# Patient Record
Sex: Female | Born: 1945 | Race: White | Hispanic: No | Smoking: Never smoker
Health system: Southern US, Community
[De-identification: ages and names within clinical notes are randomized; demographics above are authoritative.]

## PROBLEM LIST (undated history)

## (undated) DIAGNOSIS — K219 Gastro-esophageal reflux disease without esophagitis: Secondary | ICD-10-CM

## (undated) DIAGNOSIS — F32A Depression, unspecified: Secondary | ICD-10-CM

## (undated) DIAGNOSIS — I2510 Atherosclerotic heart disease of native coronary artery without angina pectoris: Secondary | ICD-10-CM

## (undated) DIAGNOSIS — I10 Essential (primary) hypertension: Secondary | ICD-10-CM

## (undated) DIAGNOSIS — E119 Type 2 diabetes mellitus without complications: Secondary | ICD-10-CM

## (undated) DIAGNOSIS — F329 Major depressive disorder, single episode, unspecified: Secondary | ICD-10-CM

## (undated) HISTORY — PX: PERCUTANEOUS CORONARY ROTOBLATOR INTERVENTION (PCI-R): SHX6015

---

## 2005-07-06 ENCOUNTER — Ambulatory Visit: Payer: Self-pay

## 2013-04-04 ENCOUNTER — Emergency Department: Payer: Self-pay | Admitting: Emergency Medicine

## 2013-04-04 LAB — DRUG SCREEN, URINE
Amphetamines, Ur Screen: NEGATIVE (ref ?–1000)
Cocaine Metabolite,Ur ~~LOC~~: NEGATIVE (ref ?–300)
MDMA (Ecstasy)Ur Screen: POSITIVE (ref ?–500)
Methadone, Ur Screen: NEGATIVE (ref ?–300)
Opiate, Ur Screen: NEGATIVE (ref ?–300)
Phencyclidine (PCP) Ur S: NEGATIVE (ref ?–25)
Tricyclic, Ur Screen: NEGATIVE (ref ?–1000)

## 2013-04-04 LAB — URINALYSIS, COMPLETE
Bilirubin,UR: NEGATIVE
Blood: NEGATIVE
Glucose,UR: >=500 mg/dL (ref 0–75)
Hyaline Cast: 4
Ketone: NEGATIVE
Nitrite: POSITIVE
Ph: 5 (ref 4.5–8.0)
Specific Gravity: 1.02 (ref 1.003–1.030)
Squamous Epithelial: 1

## 2013-04-04 LAB — CBC
HCT: 42.1 % (ref 35.0–47.0)
HGB: 14.2 g/dL (ref 12.0–16.0)
MCHC: 33.6 g/dL (ref 32.0–36.0)
RBC: 4.69 10*6/uL (ref 3.80–5.20)
RDW: 13 % (ref 11.5–14.5)
WBC: 7.7 10*3/uL (ref 3.6–11.0)

## 2013-04-04 LAB — COMPREHENSIVE METABOLIC PANEL
Alkaline Phosphatase: 126 U/L (ref 50–136)
Anion Gap: 7 (ref 7–16)
Bilirubin,Total: 0.8 mg/dL (ref 0.2–1.0)
Calcium, Total: 8.8 mg/dL (ref 8.5–10.1)
Chloride: 101 mmol/L (ref 98–107)
Co2: 23 mmol/L (ref 21–32)
Creatinine: 1.72 mg/dL — ABNORMAL HIGH (ref 0.60–1.30)
EGFR (Non-African Amer.): 31 — ABNORMAL LOW
Osmolality: 300 (ref 275–301)
SGPT (ALT): 19 U/L (ref 12–78)
Sodium: 131 mmol/L — ABNORMAL LOW (ref 136–145)

## 2013-04-04 LAB — BASIC METABOLIC PANEL
Anion Gap: 7 (ref 7–16)
BUN: 26 mg/dL — ABNORMAL HIGH (ref 7–18)
Co2: 22 mmol/L (ref 21–32)
EGFR (African American): 46 — ABNORMAL LOW
EGFR (Non-African Amer.): 40 — ABNORMAL LOW
Glucose: 399 mg/dL — ABNORMAL HIGH (ref 65–99)
Osmolality: 295 (ref 275–301)
Sodium: 137 mmol/L (ref 136–145)

## 2013-04-04 LAB — ETHANOL: Ethanol: <3 mg/dL

## 2013-05-14 ENCOUNTER — Inpatient Hospital Stay: Payer: Self-pay | Admitting: Student

## 2013-05-14 LAB — DRUG SCREEN, URINE
Barbiturates, Ur Screen: NEGATIVE (ref ?–200)
Cannabinoid 50 Ng, Ur ~~LOC~~: NEGATIVE (ref ?–50)
Cocaine Metabolite,Ur ~~LOC~~: NEGATIVE (ref ?–300)
Opiate, Ur Screen: POSITIVE (ref ?–300)

## 2013-05-14 LAB — URINALYSIS, COMPLETE
Glucose,UR: >=500 mg/dL (ref 0–75)
Nitrite: NEGATIVE
RBC,UR: 4 /HPF (ref 0–5)
Specific Gravity: 1.018 (ref 1.003–1.030)
Squamous Epithelial: 1
WBC UR: 85 /HPF (ref 0–5)

## 2013-05-14 LAB — PROTIME-INR: INR: 1.1

## 2013-05-14 LAB — COMPREHENSIVE METABOLIC PANEL
Albumin: 2.9 g/dL — ABNORMAL LOW (ref 3.4–5.0)
Alkaline Phosphatase: 131 U/L (ref 50–136)
BUN: 38 mg/dL — ABNORMAL HIGH (ref 7–18)
Calcium, Total: 9.4 mg/dL (ref 8.5–10.1)
Co2: 18 mmol/L — ABNORMAL LOW (ref 21–32)
EGFR (African American): 40 — ABNORMAL LOW
Osmolality: 285 (ref 275–301)
Potassium: 4.4 mmol/L (ref 3.5–5.1)
Total Protein: 7.4 g/dL (ref 6.4–8.2)

## 2013-05-14 LAB — TROPONIN I: Troponin-I: 3.2 ng/mL — ABNORMAL HIGH

## 2013-05-14 LAB — CBC
HCT: 40 % (ref 35.0–47.0)
HGB: 13.3 g/dL (ref 12.0–16.0)
MCHC: 33.3 g/dL (ref 32.0–36.0)
RDW: 13.2 % (ref 11.5–14.5)
WBC: 12.4 10*3/uL — ABNORMAL HIGH (ref 3.6–11.0)

## 2013-05-14 LAB — CK TOTAL AND CKMB (NOT AT ARMC)
CK, Total: 186 U/L (ref 21–215)
CK-MB: 3.9 ng/mL — ABNORMAL HIGH (ref 0.5–3.6)

## 2013-05-15 LAB — APTT
Activated PTT: 43.6 secs — ABNORMAL HIGH (ref 23.6–35.9)
Activated PTT: 43.8 secs — ABNORMAL HIGH (ref 23.6–35.9)
Activated PTT: 48 secs — ABNORMAL HIGH (ref 23.6–35.9)
Activated PTT: 48.9 secs — ABNORMAL HIGH (ref 23.6–35.9)

## 2013-05-15 LAB — COMPREHENSIVE METABOLIC PANEL
Albumin: 2.3 g/dL — ABNORMAL LOW (ref 3.4–5.0)
Alkaline Phosphatase: 105 U/L (ref 50–136)
Anion Gap: 10 (ref 7–16)
BUN: 29 mg/dL — ABNORMAL HIGH (ref 7–18)
Bilirubin,Total: 0.7 mg/dL (ref 0.2–1.0)
Calcium, Total: 8.7 mg/dL (ref 8.5–10.1)
Co2: 23 mmol/L (ref 21–32)
Creatinine: 1.48 mg/dL — ABNORMAL HIGH (ref 0.60–1.30)
EGFR (African American): 43 — ABNORMAL LOW
Glucose: 168 mg/dL — ABNORMAL HIGH (ref 65–99)
Osmolality: 280 (ref 275–301)
SGPT (ALT): 16 U/L (ref 12–78)
Sodium: 135 mmol/L — ABNORMAL LOW (ref 136–145)
Total Protein: 6.3 g/dL — ABNORMAL LOW (ref 6.4–8.2)

## 2013-05-15 LAB — CK TOTAL AND CKMB (NOT AT ARMC): CK, Total: 147 U/L (ref 21–215)

## 2013-05-15 LAB — LIPID PANEL
Cholesterol: 102 mg/dL (ref 0–200)
Ldl Cholesterol, Calc: 39 mg/dL (ref 0–100)
Triglycerides: 262 mg/dL — ABNORMAL HIGH (ref 0–200)

## 2013-05-15 LAB — CBC WITH DIFFERENTIAL/PLATELET
Basophil %: 0.3 %
Eosinophil #: 0 10*3/uL (ref 0.0–0.7)
Eosinophil %: 0 %
HGB: 12.5 g/dL (ref 12.0–16.0)
Lymphocyte #: 0.7 10*3/uL — ABNORMAL LOW (ref 1.0–3.6)
MCH: 30.1 pg (ref 26.0–34.0)
MCHC: 34.7 g/dL (ref 32.0–36.0)
MCV: 87 fL (ref 80–100)
Neutrophil %: 83.7 %
WBC: 10.3 10*3/uL (ref 3.6–11.0)

## 2013-05-15 LAB — TSH: Thyroid Stimulating Horm: 1.66 u[IU]/mL

## 2013-05-15 LAB — HEMOGLOBIN A1C: Hemoglobin A1C: 12.2 % — ABNORMAL HIGH (ref 4.2–6.3)

## 2013-05-16 LAB — APTT: Activated PTT: 47.4 secs — ABNORMAL HIGH (ref 23.6–35.9)

## 2013-05-16 LAB — BASIC METABOLIC PANEL
BUN: 33 mg/dL — ABNORMAL HIGH (ref 7–18)
Chloride: 99 mmol/L (ref 98–107)
Co2: 27 mmol/L (ref 21–32)
EGFR (African American): 32 — ABNORMAL LOW

## 2013-05-16 LAB — CULTURE, BLOOD (SINGLE)

## 2013-05-16 LAB — URINE CULTURE

## 2013-05-17 LAB — BASIC METABOLIC PANEL
Anion Gap: 9 (ref 7–16)
BUN: 27 mg/dL — ABNORMAL HIGH (ref 7–18)
Chloride: 104 mmol/L (ref 98–107)
Co2: 25 mmol/L (ref 21–32)
Creatinine: 1.42 mg/dL — ABNORMAL HIGH (ref 0.60–1.30)
EGFR (Non-African Amer.): 39 — ABNORMAL LOW
Osmolality: 291 (ref 275–301)
Sodium: 138 mmol/L (ref 136–145)

## 2013-05-17 LAB — CBC WITH DIFFERENTIAL/PLATELET
Basophil #: 0 10*3/uL (ref 0.0–0.1)
Eosinophil #: 0 10*3/uL (ref 0.0–0.7)
HGB: 12 g/dL (ref 12.0–16.0)
Lymphocyte #: 1.1 10*3/uL (ref 1.0–3.6)
MCH: 29.7 pg (ref 26.0–34.0)
MCHC: 33.8 g/dL (ref 32.0–36.0)
MCV: 88 fL (ref 80–100)
Monocyte #: 0.6 x10 3/mm (ref 0.2–0.9)
Monocyte %: 9.4 %
Neutrophil #: 4.4 10*3/uL (ref 1.4–6.5)
Neutrophil %: 72 %
Platelet: 101 10*3/uL — ABNORMAL LOW (ref 150–440)
RBC: 4.03 10*6/uL (ref 3.80–5.20)
WBC: 6.1 10*3/uL (ref 3.6–11.0)

## 2013-05-30 ENCOUNTER — Inpatient Hospital Stay: Payer: Self-pay | Admitting: Internal Medicine

## 2013-05-30 LAB — CBC
HCT: 40.6 % (ref 35.0–47.0)
HGB: 13.6 g/dL (ref 12.0–16.0)
MCH: 29.5 pg (ref 26.0–34.0)
MCHC: 33.6 g/dL (ref 32.0–36.0)
MCV: 88 fL (ref 80–100)
Platelet: 180 10*3/uL (ref 150–440)
RBC: 4.62 10*6/uL (ref 3.80–5.20)
RDW: 13.4 % (ref 11.5–14.5)
WBC: 8.6 10*3/uL (ref 3.6–11.0)

## 2013-05-30 LAB — URINALYSIS, COMPLETE
Glucose,UR: 50 mg/dL (ref 0–75)
Ketone: NEGATIVE
Nitrite: NEGATIVE
Squamous Epithelial: 26
WBC UR: 170 /HPF (ref 0–5)

## 2013-05-30 LAB — COMPREHENSIVE METABOLIC PANEL
Albumin: 3.4 g/dL (ref 3.4–5.0)
Bilirubin,Total: 0.5 mg/dL (ref 0.2–1.0)
Creatinine: 3.5 mg/dL — ABNORMAL HIGH (ref 0.60–1.30)
EGFR (Non-African Amer.): 13 — ABNORMAL LOW
Osmolality: 276 (ref 275–301)
SGPT (ALT): 15 U/L (ref 12–78)
Total Protein: 7.7 g/dL (ref 6.4–8.2)

## 2013-05-31 LAB — CBC WITH DIFFERENTIAL/PLATELET
Basophil #: 0 10*3/uL (ref 0.0–0.1)
Basophil %: 0.6 %
Eosinophil #: 0.1 10*3/uL (ref 0.0–0.7)
HCT: 33.4 % — ABNORMAL LOW (ref 35.0–47.0)
Lymphocyte #: 2.2 10*3/uL (ref 1.0–3.6)
MCHC: 34.3 g/dL (ref 32.0–36.0)
MCV: 88 fL (ref 80–100)
Monocyte #: 0.4 x10 3/mm (ref 0.2–0.9)
Monocyte %: 8.7 %
Neutrophil %: 41.8 %
Platelet: 164 10*3/uL (ref 150–440)
RBC: 3.81 10*6/uL (ref 3.80–5.20)
RDW: 13.2 % (ref 11.5–14.5)
WBC: 4.6 10*3/uL (ref 3.6–11.0)

## 2013-05-31 LAB — BASIC METABOLIC PANEL
BUN: 40 mg/dL — ABNORMAL HIGH (ref 7–18)
Chloride: 101 mmol/L (ref 98–107)
Creatinine: 3.1 mg/dL — ABNORMAL HIGH (ref 0.60–1.30)
EGFR (Non-African Amer.): 15 — ABNORMAL LOW
Glucose: 190 mg/dL — ABNORMAL HIGH (ref 65–99)
Potassium: 4.1 mmol/L (ref 3.5–5.1)
Sodium: 134 mmol/L — ABNORMAL LOW (ref 136–145)

## 2013-05-31 LAB — HEMOGLOBIN A1C: Hemoglobin A1C: 12.5 % — ABNORMAL HIGH (ref 4.2–6.3)

## 2013-06-01 LAB — BASIC METABOLIC PANEL
BUN: 32 mg/dL — ABNORMAL HIGH (ref 7–18)
Calcium, Total: 8.7 mg/dL (ref 8.5–10.1)
Co2: 25 mmol/L (ref 21–32)
EGFR (African American): 31 — ABNORMAL LOW
EGFR (Non-African Amer.): 26 — ABNORMAL LOW
Glucose: 187 mg/dL — ABNORMAL HIGH (ref 65–99)
Potassium: 4.9 mmol/L (ref 3.5–5.1)

## 2013-06-01 LAB — URINE CULTURE

## 2013-06-02 LAB — BASIC METABOLIC PANEL
Anion Gap: 5 — ABNORMAL LOW (ref 7–16)
Chloride: 111 mmol/L — ABNORMAL HIGH (ref 98–107)
Co2: 26 mmol/L (ref 21–32)
Creatinine: 1.53 mg/dL — ABNORMAL HIGH (ref 0.60–1.30)
EGFR (African American): 41 — ABNORMAL LOW
EGFR (Non-African Amer.): 35 — ABNORMAL LOW
Glucose: 92 mg/dL (ref 65–99)
Osmolality: 288 (ref 275–301)
Potassium: 4.6 mmol/L (ref 3.5–5.1)
Sodium: 142 mmol/L (ref 136–145)

## 2013-09-02 ENCOUNTER — Emergency Department: Payer: Self-pay | Admitting: Emergency Medicine

## 2014-11-02 ENCOUNTER — Emergency Department: Payer: Self-pay | Admitting: Emergency Medicine

## 2014-11-02 LAB — COMPREHENSIVE METABOLIC PANEL
ANION GAP: 15 (ref 7–16)
Albumin: 3.8 g/dL (ref 3.4–5.0)
Alkaline Phosphatase: 126 U/L — ABNORMAL HIGH
BUN: 24 mg/dL — ABNORMAL HIGH (ref 7–18)
Bilirubin,Total: 0.9 mg/dL (ref 0.2–1.0)
CALCIUM: 9.7 mg/dL (ref 8.5–10.1)
CHLORIDE: 97 mmol/L — AB (ref 98–107)
Co2: 22 mmol/L (ref 21–32)
Creatinine: 1.61 mg/dL — ABNORMAL HIGH (ref 0.60–1.30)
GFR CALC AF AMER: 41 — AB
GFR CALC NON AF AMER: 34 — AB
Glucose: 564 mg/dL (ref 65–99)
OSMOLALITY: 298 (ref 275–301)
POTASSIUM: 4.2 mmol/L (ref 3.5–5.1)
SGOT(AST): 20 U/L (ref 15–37)
SGPT (ALT): 20 U/L
SODIUM: 134 mmol/L — AB (ref 136–145)
Total Protein: 7.5 g/dL (ref 6.4–8.2)

## 2014-11-02 LAB — CBC WITH DIFFERENTIAL/PLATELET
BASOS PCT: 0.7 %
Basophil #: 0.1 10*3/uL (ref 0.0–0.1)
Eosinophil #: 0 10*3/uL (ref 0.0–0.7)
Eosinophil %: 0.5 %
HCT: 44.7 % (ref 35.0–47.0)
HGB: 14.4 g/dL (ref 12.0–16.0)
LYMPHS PCT: 14.8 %
Lymphocyte #: 1.3 10*3/uL (ref 1.0–3.6)
MCH: 29.8 pg (ref 26.0–34.0)
MCHC: 32.3 g/dL (ref 32.0–36.0)
MCV: 92 fL (ref 80–100)
Monocyte #: 0.5 x10 3/mm (ref 0.2–0.9)
Monocyte %: 6 %
NEUTROS PCT: 78 %
Neutrophil #: 6.9 10*3/uL — ABNORMAL HIGH (ref 1.4–6.5)
Platelet: 132 10*3/uL — ABNORMAL LOW (ref 150–440)
RBC: 4.85 10*6/uL (ref 3.80–5.20)
RDW: 13.6 % (ref 11.5–14.5)
WBC: 8.9 10*3/uL (ref 3.6–11.0)

## 2014-11-02 LAB — LIPASE, BLOOD: Lipase: 156 U/L (ref 73–393)

## 2014-11-02 LAB — TROPONIN I: Troponin-I: <0.02 ng/mL

## 2015-02-19 NOTE — H&P (Signed)
PATIENT NAME:  [name] NeatCATES, [name] Munoz MR#:  [id number] DATE OF BIRTH:  July 16, 1946  DATE OF ADMISSION:  05/30/2013  PRIMARY CARE PHYSICIAN:  Dr. [facility] at [facility]  CHIEF COMPLAINT:  Does not remember anything from being at the bank, and found to have a low sugar.   HISTORY OF PRESENT ILLNESS: This is a 69 year old female who was [facility], and they served out a snack around 2:00 p.m. She did not eat it. She wanted to keep it until she got home, and had something to drink. She went to the back around 5:00, and that is the last thing she remembered until the ambulance came. Her sugar was low. She was on Lantus 65 units in the morning and 64 units in the evening up until today, when they decreased it to 50 twice a day because she has had some low sugars. In the ER, her first sugar was 85 and the last sugar was 114. She is eating, but does have a decreased appetite. She has been taking Coricidin HBP for a cough. She recently finished up a course of antibiotics from recently being in the hospital for E. coli sepsis. In the ER, she was found to have acute renal failure, with a creatinine up at 3.5, was treated for hypoglycemia, and her urinalysis was also positive. Hospitalist services were contacted for further evaluation.    PAST MEDICAL HISTORY: Hypertension, gastroesophageal reflux disease, depression, chronic insomnia, IBS, diabetes, diabetic neuropathy, diabetic retinopathy, coronary artery disease, chronic kidney disease, hyperlipidemia, arthritis, chronic back pain, recent admission for E. coli sepsis, and encephalopathy.   PAST SURGICAL HISTORY:  Cataracts.   ALLERGIES:  PENICILLIN, ERYTHROMYCIN.   MEDICATIONS:  Include Wellbutrin 150 mg twice a day, hydrochlorothiazide/lisinopril 25/20, 1 tablet daily, Protonix 40 mg daily, pravastatin 40 mg daily, tramadol 50 mg twice a day as needed for back pain, Plavix 75 mg daily, metoprolol 25 mg daily, trazodone 25 mg at bedtime as needed for  sleep, aspirin 81 mg daily, Celexa 40 mg daily, Lantus just decreased today to 50 units subcutaneous injection twice a day, NovoLog Flex pen 15 units 3 times a day before meals, hydrocortisone topically 1% to rash p.r.n., vitamin D2, 50,000 units once a week, Eucerin cream p.r.n., Norco 5/325 every 6 hours as needed for pain.   SOCIAL HISTORY:  No smoking. No alcohol. No drug use. Lives alone. Used to work in a [employer].   FAMILY HISTORY:  Father died at 2771 of an MI. Mother died of gallbladder cancer, had diabetes and depression.   REVIEW OF SYSTEMS:  CONSTITUTIONAL: Positive for low-grade fever. Positive for chills. No sweats. No weight loss. No weight gain. No weakness or fatigue.  EYES:  She does wear glasses.  EARS, NOSE, MOUTH AND THROAT:  Decreased hearing right ear. Positive for runny nose. Positive for sore throat.  CARDIOVASCULAR:  No chest pain. No palpitations.  RESPIRATORY:  Positive for shortness of breath, cough, green-yellow phlegm since Monday.  GASTROINTESTINAL: Positive for abdominal pain, occasionally sharp in nature. Occasional diarrhea. No bright red blood per rectum. No nausea/vomiting. Positive for burning on urination. No hematuria.  MUSCULOSKELETAL: Positive for low back pain.  PSYCHIATRIC:  Positive for anxiety.  ENDOCRINE:  No thyroid problems.  HEMATOLOGIC/LYMPHATIC: No anemia.   PHYSICAL EXAMINATION: VITAL SIGNS: Temperature 97.6, pulse 58, respirations 18, blood pressure 116/58, pulse ox 98% on room air.  GENERAL:  No respiratory distress.  EYES: Conjunctivae and lids normal. Pupils equal, round and reactive  to light. Extraocular muscles intact. No nystagmus.  EARS, NOSE, MOUTH AND THROAT: Tympanic membranes:  No erythema. Nasal mucosa: No erythema. Throat:  No erythema, no exudate seen. Lips and gums:  No lesions.  NECK: No JVD. No bruits. No lymphadenopathy. No thyromegaly. No thyroid nodules palpated.  RESPIRATORY:  Lungs clear to auscultation. No use of  accessory muscles to breathe. No rhonchi, rales or wheeze heard.  CARDIOVASCULAR SYSTEM:  S1, S2 normal. No gallops, rubs or murmurs heard. Carotid upstroke 2+ bilaterally. No bruits. Dorsalis pedis pulses 2+ bilaterally. No edema of the lower extremities.  ABDOMEN:  Soft, nontender. No organomegaly/splenomegaly. Normoactive bowel sounds. No masses felt.  LYMPHATIC:  No lymph nodes in the neck.  MUSCULOSKELETAL: No clubbing, edema or cyanosis.  SKIN:  No rashes or ulcers seen.  NEUROLOGIC:  Cranial nerves II through XII grossly intact. Deep tendon reflexes 2+ bilateral lower extremities.  PSYCHIATRIC: The patient is oriented to person, place and time.   LABORATORY AND RADIOLOGICAL DATA: Urinalysis: 3+ leukocyte esterase, 3+ bacteria. Last sugar was 114. White blood cell count 8.6, H and H 13.6 and 40.6, platelet count 180. Glucose 90, BUN 42, creatinine 3.5, sodium 133, potassium 4.2, chloride 101, CO2 of 23, calcium 9.2. Liver function tests:  Normal range. GFR 13. Troponin negative.   ASSESSMENT AND PLAN: 1.  Acute renal failure on chronic kidney disease. Creatinine today 3.5, baseline around 1.4. Will give IV fluid hydration. Hold lisinopril/hydrochlorothiazide. Will get an ultrasound of the kidneys and bladder.   2.  Hypoglycemia with diabetes, likely worsened, kidney function is contributing. Will hold the Lantus and short-acting insulin at this time, just check fingersticks q. 2 hours. Will try to hold off on adding D5 in the IV fluids at this point. If drops, will have to add on D5. If the sugars start rising, may have to start back a lower dose Lantus in the morning or tomorrow evening.   3.  Urinary tract infection, with recent E. coli sepsis. The E. coli was pansensitive. I will put on Rocephin like she had last time until cultures are back.   4.  Hypertension. Will hold lisinopril/hydrochlorothiazide. Blood pressure is on the lower side. Will give IV fluid hydration and continue to  monitor the blood pressure.   5.  Gastroesophageal reflux disease, on Protonix.   6.  Depression. Continue Wellbutrin.   7.  Hyperlipidemia. Continue pravastatin.   8.  History of coronary artery disease. On Plavix and aspirin.   9.  Chronic back pain. Stop tramadol because it interacts with the Wellbutrin, and continue the Norco.   Time spent on admission:  50 minutes.    ____________________________ Herschell Dimes. Renae Gloss, MD rjw:mr D: 05/30/2013 21:36:14 ET T: 05/30/2013 22:10:43 ET JOB#: [id number]  cc: Herschell Dimes. Renae Gloss, MD, <Dictator> Tammy H. Shon Baton, NP   Salley Scarlet MD ELECTRONICALLY SIGNED 06/13/2013 16:32

## 2015-02-19 NOTE — H&P (Signed)
PATIENT NAME:  [name] NeatCATES, Amazing MR#:  [id number] DATE OF BIRTH:  16-Aug-1946  DATE OF ADMISSION:  05/14/2013  Since the patient has decreased CO2, decreased PO2, with mixed picture of metabolic acidosis and respiratory alkalosis compensated, decided to do a D-dimer. The D-dimer is elevated at 2.72. V/Q scan ordered, as the patient has chronic kidney disease, with a creatinine above 1.5, to rule out pulmonary embolism. At this moment, the patient is actually getting anticoagulation for non-ST elevation MI, likely also a pulmonary embolism. The non-ST elevation MI could be secondary to the pulmonary embolism. We are going to rule it out.   At this moment, the patient has a lot of problems. There is life-threatening potential for decompensation. I am concerned about her care, for what we are going to monitor very closely.   Critical care time due to multiple medical conditions, with potential decompensation critical care time is about 70 minutes.    ____________________________ Felipa Furnaceoberto Sanchez Gutierrez, MD rsg:mr D: 05/14/2013 18:53:23 ET T: 05/14/2013 19:27:41 ET JOB#: [id number]  cc: Felipa Furnaceoberto Sanchez Gutierrez, MD, <Dictator> Tedi Hughson Juanda ChanceSANCHEZ GUTIERRE MD ELECTRONICALLY SIGNED 05/15/2013 20:29

## 2015-02-19 NOTE — Discharge Summary (Signed)
PATIENT NAME:  [name] Munoz, [name] Munoz MR#:  [id number] DATE OF BIRTH:  11/27/1945  DATE OF ADMISSION:  05/14/2013 DATE OF DISCHARGE:  05/20/2013    PRIMARY CARE PHYSICIAN:  At [facility].   CHIEF COMPLAINT: Malaise status post fall, altered mental status.   DISCHARGE DIAGNOSES:  1.  Altered mental status secondary metabolic encephalopathy due to sepsis.  2.  Sepsis on arrival, currently resolved.  3.  Escherichia coli septicemia and a likely urinary tract infection.  4.  Positive troponin, likely  demand ischemia.  5.  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  6.  Diabetes.  7.  Renal failure, acute on chronic.  8.  Hypernatremia due to dehydration.  9.  Depression.  10.  History of gastroesophageal reflux disease.  11.  Insomnia.  12.  Irritable bowel syndrome.  13.  Diabetic neuropathy and retinopathy.  14.  History of falls.  15.  History of rhinitis.  16.  History of coronary artery disease.  17.  History of diastolic congestive heart failure.  18.  Chronic kidney disease. Baseline creatinine of 1.5. 19.  Degenerative joint disease and chronic back pain.  20.  Hyperlipidemia.   DISCHARGE MEDICATIONS:  1.  Bupropion 150 mg 2 times a day.  2.  Hydrochlorothiazide/lisinopril 25/20 mg once a day.  3.  Protonix 40 mg once a day.  4.  Pravastatin 40 mg daily.  5.  Tramadol 50 mg 2 times a day as needed for back pain.  6.  Plavix 75 mg daily.  7.  Metoprolol tartrate 25 mg daily.  8.  Trazodone 50 mg.  Take 1/2  tab at bedtime as needed for sleep.  9.  Aspirin 81 mg daily.  10.  Celexa 40 mg daily.  11.  Lantus 65 units once a day in the morning and 64 units in the evening.  12.  NovoLog through FlexPen 15 units 3 times a day before meals.  13.  Hydrocortisone topical 1%, apply to rash 2 times a day as needed.  14.  Vitamin D3 50,000 international units once a week.  15.  Eucerin p.r.n. Apply to affected area. 16.  Norco 325/5 mg every 6 hours as needed for pain.  17.  Cephalexin 500 mg  every 8 hours for 3 days only.   Going home with resumption of the [facility] program.   DIET: Low sodium, low fat, low cholesterol, ADA diet.   ACTIVITY: As tolerated.   DISCHARGE INSTRUCTIONS: Please follow with PCP and cardiologist within 1 to 2 weeks.   SIGNIFICANT LABS AND IMAGING: Initial BUN 38, creatinine 1.56, sodium 126, initial glucose 498 with bicarb of 18. Hemoglobin A1c was noted to be 12.2. Triglycerides 262, HDL 11, LDL of 39. Initial troponin 3.2 and next one 1.9 and next one, 1.8. Initial CK-MB was 3.9 and CK total of 265. CK total went down to 147 and CK-MB was normalized as well.   Initial LFTs showed a total bilirubin of 1.4, albumin of 2.9; otherwise within normal limits.   Detox positive for MDMA and opiates.   Initial WBC 12.4 and it did normalize. Initial hemoglobin was 13.3. Platelets initially were 94. Last platelet of 101.   D-dimer was 2.7. INR 1.1 and PT was 14.3.   Blood cultures came back positive pansensitive E. coli.   UA was also positive for 3+ leukocyte esterase, 4 RBCs and 85 WBCs with 3+ bacteria, but the urine cultures were sent after antibiotics and they came back negative.   Initial ABG showed pH  of 7.39, pCO2 of 28 and pO2 of 119.   CT of the head without contrast on arrival showed no acute intracranial process. Chest x-ray on admission showed no evidence of atelectasis or pneumonia. V/Q scan showed normal ventilation perfusion.   HISTORY OF PRESENT ILLNESS AND HOSPITAL COURSE: For full details of H and P, please see the dictation on July 16 by Dr. Mordecai Maes, but briefly this is an obese 69 year old female with multiple medical issues including uncontrolled diabetes, hypertension, depression, IBS, neuropathy and chronic back pain, who went to see a podiatrist, diagnosed with a fracture of the left foot. She had apparently just leaned forward to pick up something from the chair and fell.   Over the weekend before admission, she could not get around much  and did not take most of her medications. The sugars were in the 400s. She also did have bouts of confusion and came into the ER. She did have a fever. She had a mild leukocytosis and was admitted for clinical sepsis and started on antibiotics. Blood cultures were sent and patient did have a positive UA.   She was started on Rocephin; however, her urine cultures were sent after the antibiotics and they came back no growth to date so far, but the blood cultures came back positive for E. coli. She had a CT of the head which was negative for acute intracranial pathology. Her altered mental status is resolved and that was likely secondary to sepsis and metabolic encephalopathy. The sepsis was likely secondary to a UTI and E. coli septicemia.   Her repeat blood cultures have been negative and she needs 3 more days of Keflex. Her ceftriaxone has been converted to it. She remains afebrile and the leukocytosis has reversed. She was noted to have a positive troponin on arrival and she was seen by cardiology. No cath is recommended at this time and she is to follow with outpatient with them.   Her aspirin, Plavix, beta blocker, statin, and ACE inhibitor were continued. She did have thrombocytopenia, but I suspect that was chronic as it was also lower in June before this started. She does have elevated D-dimer, however, had a V/Q scan with low risk. She does have a bout of acute-on-chronic renal failure secondary to above.   I suspect she has chronic renal failure, stage III. Her hyponatremia improved with fluids. At this point, she will be discharged with outpatient followup with cardiology and [facility] chest-pain free and afebrile.   PHYSICAL EXAMINATION:  VITAL SIGNS: Today her temperature was 97.1, Pulse rate was 61 and blood pressure is 101/61 with O2  saturation of 98% on room air.  GENERAL: Obese female lying in bed in no obvious distress.  NECK: Supple. LUNGS: Normal respiratory effort and no crackles  or wheezing on auscultation.  CARDIOVASCULAR: She does not have any significant murmurs and has regular rate and rhythm on examination of the heart.  ABDOMEN: She had no significant lower extremity edema or tenderness on abdominal palpation.   At this point, she will be discharged with outpatient followup as dictated above.   TOTAL TIME SPENT: Was 40 minutes.   CODE STATUS: FULL CODE.    ____________________________ Krystal Eaton, MD sa:np D: 05/20/2013 14:29:47 ET T: 05/20/2013 15:09:42 ET JOB#: [id number]  cc: Krystal Eaton, MD, <Dictator> Krystal Eaton MD ELECTRONICALLY SIGNED 06/01/2013 11:37

## 2015-02-19 NOTE — Discharge Summary (Signed)
PATIENT NAME:  [name] Munoz, Gerianne MR#:  [id number] DATE OF BIRTH:  1946/09/21  DATE OF ADMISSION:  05/30/2013 DATE OF DISCHARGE:  06/02/2013  ADMITTING DIAGNOSIS: Altered mental status.   DISCHARGE DIAGNOSES:  1. Altered mental status felt to be due to hypoglycemia as well as urinary tract infection as well as acute on chronic renal failure.  2. Hypoglycemia with diabetes, on high-dose insulin. The patient's insulin is decreased. Hypoglycemia likely due to renal failure as well as the patient's intake may have been less.  3. Urinary tract infection.  4. Hypertension.  5. Gastroesophageal reflux disease.  6. Depression.  7. Hyperlipidemia.  8. History of coronary artery disease.  9. Chronic back pain.  10. Seasonal allergies.   CONSULTANTS: Case Production designer, theatre/television/filmmanager.   PERTINENT LABORATORY EVALUATIONS: Admitting glucose 90, BUN 42, creatinine 3.50, sodium 133, potassium was 4.2, chloride 101, CO2 is 23. Hemoglobin A1c was 12.5. LFTs were normal. Troponin less than 0.02. WBC 8.6, hemoglobin 13.6, platelet count was 180. Urinalysis showed 3+ RBCs, WBCs 170, bacteria 3+. Urine culture showed mixed bacterial flora. Most recent creatinine is 1.53.   HOSPITAL COURSE: Please refer to H and P done by the admitting physician. The patient is a 69 year old female who [facility], where they gave her a snack around 2:00 p.m. She did not eat it. She wanted to keep it until she got home and had something to drink. She went to the back around 5:00, and then the last thing she remembered, until the ambulance came. Her sugar was low. The patient was on high-dose Lantus. She was seen in the ED and found to have acute renal failure as well as UTI as well as hypoglycemia. Therefore, we were asked to admit the patient. Initially, she was placed on D5 containing IV fluids. Her sugars were monitored closely as she has got a very labile blood sugar. Her blood glucose started increasing in the 200s. Therefore, she was  started back on her Lantus at lower dose. Her sugars continued to be labile, but under better control. No hypoglycemia. She may benefit from endocrinology outpatient consult. The patient was also noted to have acute renal failure, which was felt to be due to possible dehydration and concurrent lisinopril and HCTZ therapy. These were held. Given IV fluids. Her creatinine is close to baseline. The patient also was noted to have a significantly abnormal UA. She was treated with ceftriaxone, but her urine cultures came back as mixed bacterial flora. At this time, the patient is doing much better and feels much better and is stable for discharge.   DISCHARGE MEDICATIONS:  1. Bupropion 150 mg 1 tab p.o. b.i.d. 2. Protonix 40 daily.  3. Pravastatin 40 at bedtime. 4. Plavix 75 p.o. daily.  5. Metoprolol tartrate 25 one tab p.o. b.i.d. 6. Trazodone 25 at bedtime as needed for sleep.  7. Aspirin 81 mg 1 tab p.o. daily. 8. Celexa 40 daily.  9. Hydrocortisone topical 1% apply to rash 2 times a day as needed.  10. Vitamin D3 50,000 international units daily. 11. Eucerin apply topically to affected area daily as needed.  12. Norco 325/5 mg 1 tab p.o. q.6 p.r.n. for pain. 13. Tramadol 50 mg 1 tab p.o. b.i.d. as needed for pain. 14. Lantus 40 units subcutaneous at bedtime. 15. Humulin R sliding scale.  16. Ceftin 500 mg 1 tab p.o. b.i.d.   DIET: Low sodium, low fat, low cholesterol, carbohydrate controlled diet.   ACTIVITY: As tolerated.   FOLLOWUP:  With [facility] program in 1 to 2 weeks.   TIME SPENT: Note, 35 minutes spent on the discharge.    ____________________________ Lacie Scotts. Allena Katz, MD shp:OSi D: 06/03/2013 08:22:53 ET T: 06/03/2013 08:56:25 ET JOB#: [id number]  cc: Kamuela Magos H. Allena Katz, MD, <Dictator> Charise Carwin MD ELECTRONICALLY SIGNED 06/09/2013 10:25

## 2015-02-19 NOTE — Consult Note (Signed)
Brief Consult Note: Diagnosis: AMS/Demand ischemia/Sepsis.   Patient was seen by consultant.   Consult note dictated.   Recommend further assessment or treatment.   Orders entered.   Discussed with Attending MD.   Comments: IMP AMS Sepsis DM HTN Chronic pain Dehydration CAD CRI . PLAN ICU care Antibx IV Hydration DM control F/U troponin Switch heparin to SQ DVT prophylaxis Correct acidosis Consider nephology input I do not rec cath at this point.  Electronic Signatures: Dorothyann Pengallwood, Dwayne D (MD)  (Signed 17-Jul-14 17:04)  Authored: Brief Consult Note   Last Updated: 17-Jul-14 17:04 by Alwyn Peaallwood, Dwayne D (MD)

## 2015-02-19 NOTE — Consult Note (Signed)
PATIENT NAME:  [name] Munoz, [name] Munoz MR#:  [id number] DATE OF BIRTH:  1946-09-08  DATE OF CONSULTATION:  04/04/2013  CONSULTING PHYSICIAN:  Audery Amel, MD  IDENTIFYING INFORMATION AND REASON FOR CONSULT:  A 69 year old woman who presented to the Emergency Room by EMS. Stated that she had had some thoughts of killing herself. Consult for appropriate disposition after commitment papers had been filed.   HISTORY OF PRESENT ILLNESS: Information obtained from the patient's chart and also speaking with the nurse at [facility]. The patient reports that yesterday she had a conflict with her landlord who also happens to be her brother-in-law. She says that she had gone to get the money to pay her rent but was 5 minutes late getting back to his office and he refused to take it and told her that she would owe a late fee. The patient became very upset. She claims that the brother-in-law has been picking on her and harassing her to try to get her thrown out of the trailer park. The patient says she developed thoughts about wanting to take all of her pain medicine and kill herself. She called EMS and told them this and they eventually sent a police officer to bring her into the hospital. She did not actually take any overdose or try to harm herself. This is the same story she told [employer]. The patient is not reporting any psychotic symptoms. She is not reporting any substance abuse. She is currently getting her outpatient psychiatric treatment through [facility] and says that she has been compliant with her medication. She also reports chronic anxiety and stress in her life, but it has been about the same for quite a while.   PAST PSYCHIATRIC HISTORY: Long-standing history of mood sensitivity and depression symptoms. Has had several prior hospitalizations at [facility]. Has never actually tried to kill herself in the past. Has made suicidal statements several times. No history of homicidal  behavior. No known history of psychotic symptoms. The patient is currently being followed by [facility]. She cannot remember the names of her medicines, but says there are at least 2 of them that she is taking.   PAST MEDICAL HISTORY: The patient has diabetes and is on insulin. She knows how much she is supposed to take but says she did not take it last night and as a result presented with a very high blood sugar when she came in here. She also has a history of high blood pressure, acid reflux and fluid retention.   SOCIAL HISTORY: The patient is living by herself in a trailer park. She is estranged from her husband. She claims that the owner of the trailer park who is her brother-in-law harasses her and wants to get her thrown out of the trailer park. She currently gets her medical care from [facility] and mental health care at [facility].   SUBSTANCE ABUSE HISTORY:  Denies any history whatsoever of alcohol or drug abuse.   MEDICATIONS ON ADMISSION: Unknown, although she says she takes antidepressants, insulin, blood pressure medicine, a fluid pill, iron and aspirin.   ALLERGIES:  ERYTHROMYCIN, LAMISIL AND PENICILLIN.   REVIEW OF SYSTEMS: Recent depressed mood, which is improving. Some fatigue. No acute suicidal ideation. No homicidal ideation. No psychotic symptoms. No hallucinations.   MENTAL STATUS:  Reasonably well-groomed woman looks her stated age, lying down in the Emergency Room cooperative with the interview. Good eye contact, normal psychomotor activity. Speech normal in rate, tone and volume. Thoughts tend  to be perseverating on her complaints about the landlord. No obvious delusions or paranoia. Does not appear to be psychotic. Denies hallucinations. Denies suicidal or homicidal ideation currently. Affect is reactive. Mood stated as being better. The patient appears to probably be of lower average intelligence to average intelligence. Adequate judgment and insight at this time.    LABORATORY RESULTS: Blood sugar on presentation here was quite high with an initial presentation of 673. Alcohol undetected. TSH normal at 3.8. Drug screen positive for MDMA, probably representing medicine that she is taking. Does have an elevated creatinine of 1.39.   ASSESSMENT: A 69 year old woman with a history of chronic depression and mood instability, but not mania or psychosis. Currently, she is denying suicidal ideation. She has not shown any dangerous behavior in the Emergency Room. She is behaving in a calm and appropriate manner. At this point, she no longer meets commitment criteria.   PLAN:  For her to be discharged from the Emergency Room. She can return to stay at her own home. She will follow up with [facility] and with [facility]. The patient was given some education about managing mood instability and depression and staying compliant with treatment and she agrees to that.   DIAGNOSIS, PRINCIPAL AND PRIMARY:  AXIS I:  Depression, not otherwise specified.  SECONDARY DIAGNOSES: AXIS I:  No further.  AXIS II:  Borderline traits.   AXIS III:  Diabetes, high blood pressure.   AXIS IV:  Moderate to severe chronic from discord in her environment.   AXIS V:  Functioning at time of evaluation 56.      ____________________________  Audery AmelJohn T. Clapacs, MD jtc:ce D: 04/04/2013 17:03:21 ET T: 04/04/2013 18:07:13 ET JOB#: [id number]  cc: Audery AmelJohn T. Clapacs, MD, <Dictator> Audery AmelJOHN T CLAPACS MD ELECTRONICALLY SIGNED 04/05/2013 14:20

## 2015-02-19 NOTE — H&P (Signed)
PATIENT NAME:  [name] Munoz, [name] Munoz MR#:  [id number] DATE OF BIRTH:  05-04-1946  DATE OF ADMISSION:  05/14/2013  PRIMARY CARE PHYSICIAN: Alden Server B. Maryellen Pile, MD   CHIEF COMPLAINT: Malaise, status post fall. Was seen in the clinic and was told to come here to the ER due to alteration of mental status.   HISTORY OF PRESENT ILLNESS: This is a very nice 69 year old female who has history of multiple medical problems including uncontrolled diabetes, hypertension, depression, IBS, neuropathy, retinopathy, chronic kidney disease, coronary artery disease with diastolic dysfunction and chronic back pain. The patient comes today with a history of having a fall. This happened last Thursday, for which she went to see a podiatrist and she was diagnosed with a fracture on the left foot. Apparently, the patient just leaned forward to pick up something from the chair. The chair broke on the front leg and she landed on the floor. She has been recommended to use a walking boot, but she was mostly not moving much and staying in bed. Over the weekend she did not get around much, she did not take most of her medications, did not get well. Her blood sugars have been in the 400s and she feels very confused. The patient is separated from her husband. She lives by herself, so she has not been monitored closely. She went to the podiatrist. The podiatrist told her to go to [facility] for evaluation and since she was so confused with such high blood sugars, she was told to come to the ER. Apparently the patient has been having some fevers. Her last temperature was 104 at home, having chills and shivering a lot. She denies any cough, denies any sputum, but she feels really short of breath. She states that she has been urinating a lot more than usual.   The patient comes into the ER where she is evaluated. Her blood sugars are above 400. She is looking dehydrated. Her blood pressures are elevated. Her troponin is elevated as well. The patient  is positive for opiates in urine, which she takes on a regular basis. Her white count is slightly elevated. She has sepsis.   REVIEW OF SYSTEMS: A 12-system review of systems is done.  CONSTITUTIONAL: Positive fever. Positive chills. Positive shivering. Positive fatigue. Not moving a lot due to pain in her lower extremity. No significant weight loss or weight gain.  EYES: No blurry vision, double vision. Positive cataract surgeries.  EARS, NOSE, THROAT: No tinnitus. No difficulty swallowing. No epistaxis.  RESPIRATORY: Negative cough. Negative sputum. Negative wheezing. Negative hemoptysis. Positive mild shortness of breath. She denies any asthma or COPD. No painful respirations.  CARDIOVASCULAR: No chest pain. No orthopnea. No significant edema. No palpitations or syncope.  GASTROINTESTINAL: Positive nausea. Positive vomiting x 4 or 5 within the last 2 days. She has IBS and occasionally has diarrhea and constipation, but at this moment she says that she is normal. No melena or rectal bleeding.  GENITOURINARY: Increased frequency, mild dysuria. No hematuria. No incontinence.  GYNECOLOGIC: No breast masses or vaginal discharge.  ENDOCRINE: Positive polyuria. Positive nycturia. Positive sweating. Positive dry mouth. No thyroid dysfunction. No heat or cold intolerance.  HEMATOLOGIC AND LYMPHATIC: No anemia, easy bruising or swollen glands.  SKIN: No rashes, petechiae or new moles.  MUSCULOSKELETAL: Positive chronic back pain. No swollen joints. No gout.  NEUROLOGIC: Positive for confusion, changes in mental status. Positive diabetic neuropathy and also retinopathy. No dementia. No TIAs.  PSYCHIATRIC: Positive insomnia, but it is  well-controlled. No significant depression; it has been well-controlled. No significant anxiety lately.   PAST MEDICAL HISTORY: 1.  Hypertension.  2.  GERD.  3.  Depression.  4.  Insomnia, chronic.  5.  IBS.  6.  Diabetes.  7.  Diabetic neuropathy.  8.  Diabetic  retinopathy.  9.  History of falls.  10.  History of rhinitis.  11.  Coronary artery disease.  12.  Diastolic dysfunction, with last echo in 2012.  13.  Chronic kidney disease, baseline around 1.5.  14.  Hyperlipidemia.  15.  DJD.  16.  Back pain.   ALLERGIES: THE PATIENT IS ALLERGIC TO PENICILLIN, ERYTHROMYCIN AND LAMISIL.   PAST SURGICAL HISTORY: BTL and cataract surgery.   SOCIAL HISTORY: The patient is married, but she is separated with her husband and they have not lived together for the past year. She used to work in [employer]. She does not smoke. She does not drink.   FAMILY HISTORY: The patient denies any history of cancer in the family or heart disease. Positive hypertension.   CURRENT MEDICATIONS: Include Zofran 1 every 8 hours as needed for nausea, vitamin D 50,000 units every other week, trazodone 25 mg once a day at night, tramadol 50 mg twice daily, Protonix 40 mg once a day, pravastatin 40 mg once a day, Plavix 75 mg once a day, NovoLog FlexPen 50 units subcutaneously 3 times a day before meals, Norco 325/5 every 6 hours, MiraLAX 17 grams every other day, metoprolol 25 mg twice daily, Lantus 65 units subcutaneously every evening, Imodium as needed, hydrocortisone topical, hydrochlorothiazide with lisinopril 25 mg/20 mg once a day, Eucerin apply to affected area, Coricidin for cough, Celexa 40 mg once daily, aspirin 81 mg once daily.   PHYSICAL EXAMINATION: VITAL SIGNS: Blood pressure currently at 120/57, at admission 173/74, temperature 102, pulse 122, pulse oximetry low on room air, desaturated, 98% on 2 liters of oxygen.  GENERAL: The patient is alert, oriented x 3, in no acute distress. She looks debilitated. She looks dehydrated.  HEENT: Pupils are equal and reactive. Extraocular movements are intact. Mucosae are a little dry. Anicteric sclerae. Pink conjunctivae. No oral lesions. No oropharyngeal exudates. The patient has thrush. The patient is normocephalic, atraumatic.   NECK: Supple. No JVD. No thyromegaly. No adenopathy. No rigidity. Trachea central.  CARDIOVASCULAR: Regular rate and rhythm, tachycardic. No murmurs, rubs or gallops. No displacement of PMI. No tenderness to palpation anterior chest wall.  LUNGS: Clear without any wheezing or crepitus. Decreased respiratory sounds in both bases. No use of accessory muscles. No dullness to percussion.  ABDOMEN: Soft, nontender, nondistended. No hepatosplenomegaly. No masses. Bowel sounds are positive.  GENITAL EXAM: Deferred.  EXTREMITIES: No significant edema, cyanosis or clubbing. There is some ecchymosis at the level of the third and fourth metatarsal joints. No tenderness to palpation of the area.  VASCULAR: Capillary refill less than 3. Pulses +2.  LYMPHATIC: Negative for lymphadenopathy in neck or supraclavicular areas.  SKIN: Without any rashes or petechiae. No jaundice.  MUSCULOSKELETAL: No significant joint deformity or joint effusions. Ecchymosis on the metatarsal areas as mentioned above.  NEUROLOGIC: Cranial nerves II through XII intact. Strength 5 out of 5 in all 4 extremities. Sensation decreased in the lower extremities due to neuropathy. No focal findings.  PSYCHIATRIC: Mood is normal without any signs of depression, anxiety. The patient just looks chronically debilitated.   LABORATORY, DIAGNOSTIC AND RADIOLOGICAL DATA: Glucose of 489, BUN 38, creatinine 1.56, sodium 126, potassium 4.4, CO2  of 18, bilirubin 1.4. Troponin 3.2, CK of 265. Opiates positive, MDMA positive on UDS. White count is 12.4, hemoglobin is 13, platelet count is decreased at 94. INR 1.1. Urinalysis: 85 white blood cells, leukocyte esterase +3, protein 100, glucose more than 500. ABG shows a pH of 7.39, pCO2 of 28 with a pO2 of 119 on 2 liters nasal cannula, HCO3 of 16.   Chest x-ray does not show any significant  abnormality.   CT of the head without contrast: No intracranial process.   ASSESSMENT AND PLAN: This is a nice  69 year old female with history of multiple medical problems as mentioned above. 1.  Sepsis: The patient comes with a temperature of 102, tachycardic at 122. She has atrial fibrillation temporarily. After she was brought to the Emergency Room and given fluids, resolved to normal sinus rhythm. This is likely secondary to sepsis. The patient has an elevation of white blood cells in urine, so this is possibly a sepsis related to urinary tract infection. We are going to send a culture. We are going to start Rocephin. Her blood pressure is stable. It was elevated earlier. She is going to continue to get some intravenous fluids because she still looks dry, and the patient has a very low sodium. We will continue her intravenous fluids at 75 normal saline an hour and if her blood pressure drops, we will give her some more. Poly-culture. Ceftriaxone. X-ray is so far negative.  2.  Urinary tract infection: As mentioned above, started on Rocephin. Get cultures.  3.  Altered mental status: The patient came with a history of being confused, sent from [facility] clinic. This is likely metabolic encephalopathy due to sepsis. The patient is starting to clear up after fluids given.  4.  Dehydration: The patient looks severely dehydrated, likely due to sepsis. She has been getting intravenous fluids.  6.  Hyponatremia: Stop hydrochlorothiazide. This is likely intravascular volume depletion. The patient is being given normal saline.  7.  Uncontrolled diabetes with blood sugars in the 400s: The patient has been having uncontrolled blood sugars. For now, we are going to give her home medications, put her on a sliding scale aggressively and monitor the results.  8.  Metabolic acidosis: Likely due to sepsis. CO2 is 28. Continue intravenous fluids.  9.  Elevation of troponin: The patient has a non-ST elevation myocardial infarction and this is likely secondary to the sepsis. The patient also has some slight hypoxemia with decreased  pCO2, and she has not been moving around much, for what I am going to get a D-dimer to evaluate the possibility of pulmonary embolism. I do not have those results yet.  10.  Coronary artery disease: As mentioned above, since the patient has elevation of her troponin, we are going to treat it as a non-ST elevation myocardial infarction. Aspirin to be given 325 mg once a day. The patient is going to be on a heparin drip. Her platelets are slightly decreased at 94,000. We are going to monitor closely for bleeding, monitor closely for thrombocytopenia. If the thrombocytopenia worsens, stop heparin right away. At this moment, I think that her thrombocytopenia is mostly related to consumption of platelets due to sepsis, for what I am going to provide some folic acid intravenously to help with the formation of platelets.  11.  Chronic kidney disease: Seems to be stable at baseline. The patient has stage IV chronic kidney disease. Again, it is stable.  12.  Other medical problems seem to be  stable. Dr. Juliann Pares accepted the patient. We are going to monitor closely.  13.  The patient is a full code.  14.  Gastrointestinal prophylaxis with Protonix.   TIME SPENT: I spent about 60 minutes with this patient.   ____________________________ Felipa Furnace, MD rsg:jm D: 05/14/2013 18:10:13 ET T: 05/14/2013 19:06:14 ET JOB#: [id number]  cc: Felipa Furnace, MD, <Dictator> Elvis Laufer Juanda Chance MD ELECTRONICALLY SIGNED 05/15/2013 20:29

## 2015-02-19 NOTE — Consult Note (Signed)
Brief Consult Note: Diagnosis: depression nos.   Patient was seen by consultant.   Consult note dictated.   Discussed with Attending MD.   Comments: Psychiatry: Patient seen. Patient has chronic problems with depression and mood sensetivity. Had a conflict with her landlord last night and then developed suicidal ideation. Did not act on it. Now denies any suicidal ideation or thoughts. No psychotic symptoms. Calm and DR Dolores FrameSung, who agrees to the plan.  Electronic Signatures: Audery Amellapacs, John T (MD)  (Signed 06-Jun-14 16:56)  Authored: Brief Consult Note   Last Updated: 06-Jun-14 16:56 by Audery Amellapacs, John T (MD)

## 2015-09-17 ENCOUNTER — Observation Stay
Admission: EM | Admit: 2015-09-17 | Discharge: 2015-09-20 | Disposition: A | Discharge status: Home / Self Care | Payer: Medicare (Managed Care) | Attending: Internal Medicine | Admitting: Internal Medicine

## 2015-09-17 ENCOUNTER — Encounter: Payer: Self-pay | Admitting: *Deleted

## 2015-09-17 DIAGNOSIS — K760 Fatty (change of) liver, not elsewhere classified: Secondary | ICD-10-CM | POA: Diagnosis not present

## 2015-09-17 DIAGNOSIS — Z794 Long term (current) use of insulin: Secondary | ICD-10-CM | POA: Diagnosis not present

## 2015-09-17 DIAGNOSIS — Z833 Family history of diabetes mellitus: Secondary | ICD-10-CM | POA: Diagnosis not present

## 2015-09-17 DIAGNOSIS — E1165 Type 2 diabetes mellitus with hyperglycemia: Secondary | ICD-10-CM | POA: Insufficient documentation

## 2015-09-17 DIAGNOSIS — N189 Chronic kidney disease, unspecified: Secondary | ICD-10-CM | POA: Diagnosis not present

## 2015-09-17 DIAGNOSIS — Z955 Presence of coronary angioplasty implant and graft: Secondary | ICD-10-CM | POA: Diagnosis not present

## 2015-09-17 DIAGNOSIS — K209 Esophagitis, unspecified without bleeding: Secondary | ICD-10-CM

## 2015-09-17 DIAGNOSIS — K210 Gastro-esophageal reflux disease with esophagitis: Secondary | ICD-10-CM | POA: Diagnosis not present

## 2015-09-17 DIAGNOSIS — K920 Hematemesis: Secondary | ICD-10-CM | POA: Diagnosis not present

## 2015-09-17 DIAGNOSIS — I10 Essential (primary) hypertension: Secondary | ICD-10-CM | POA: Diagnosis not present

## 2015-09-17 DIAGNOSIS — N179 Acute kidney failure, unspecified: Secondary | ICD-10-CM | POA: Diagnosis not present

## 2015-09-17 DIAGNOSIS — Z7982 Long term (current) use of aspirin: Secondary | ICD-10-CM | POA: Diagnosis not present

## 2015-09-17 DIAGNOSIS — K5900 Constipation, unspecified: Secondary | ICD-10-CM | POA: Insufficient documentation

## 2015-09-17 DIAGNOSIS — I119 Hypertensive heart disease without heart failure: Secondary | ICD-10-CM | POA: Insufficient documentation

## 2015-09-17 DIAGNOSIS — R112 Nausea with vomiting, unspecified: Secondary | ICD-10-CM | POA: Diagnosis present

## 2015-09-17 DIAGNOSIS — R109 Unspecified abdominal pain: Secondary | ICD-10-CM | POA: Diagnosis not present

## 2015-09-17 DIAGNOSIS — Z79899 Other long term (current) drug therapy: Secondary | ICD-10-CM | POA: Diagnosis not present

## 2015-09-17 DIAGNOSIS — F329 Major depressive disorder, single episode, unspecified: Secondary | ICD-10-CM | POA: Diagnosis not present

## 2015-09-17 DIAGNOSIS — K449 Diaphragmatic hernia without obstruction or gangrene: Secondary | ICD-10-CM | POA: Diagnosis not present

## 2015-09-17 DIAGNOSIS — R1110 Vomiting, unspecified: Secondary | ICD-10-CM | POA: Diagnosis not present

## 2015-09-17 DIAGNOSIS — N281 Cyst of kidney, acquired: Secondary | ICD-10-CM | POA: Insufficient documentation

## 2015-09-17 DIAGNOSIS — I1310 Hypertensive heart and chronic kidney disease without heart failure, with stage 1 through stage 4 chronic kidney disease, or unspecified chronic kidney disease: Secondary | ICD-10-CM | POA: Diagnosis not present

## 2015-09-17 DIAGNOSIS — R51 Headache: Secondary | ICD-10-CM | POA: Diagnosis not present

## 2015-09-17 DIAGNOSIS — E1122 Type 2 diabetes mellitus with diabetic chronic kidney disease: Secondary | ICD-10-CM | POA: Insufficient documentation

## 2015-09-17 DIAGNOSIS — J029 Acute pharyngitis, unspecified: Secondary | ICD-10-CM | POA: Diagnosis not present

## 2015-09-17 DIAGNOSIS — E860 Dehydration: Secondary | ICD-10-CM | POA: Insufficient documentation

## 2015-09-17 DIAGNOSIS — Z7902 Long term (current) use of antithrombotics/antiplatelets: Secondary | ICD-10-CM | POA: Insufficient documentation

## 2015-09-17 DIAGNOSIS — I2510 Atherosclerotic heart disease of native coronary artery without angina pectoris: Secondary | ICD-10-CM | POA: Insufficient documentation

## 2015-09-17 DIAGNOSIS — M160 Bilateral primary osteoarthritis of hip: Secondary | ICD-10-CM | POA: Diagnosis not present

## 2015-09-17 DIAGNOSIS — Q8909 Congenital malformations of spleen: Secondary | ICD-10-CM | POA: Insufficient documentation

## 2015-09-17 HISTORY — DX: Essential (primary) hypertension: I10

## 2015-09-17 HISTORY — DX: Atherosclerotic heart disease of native coronary artery without angina pectoris: I25.10

## 2015-09-17 HISTORY — DX: Type 2 diabetes mellitus without complications: E11.9

## 2015-09-17 LAB — GLUCOSE, CAPILLARY: GLUCOSE-CAPILLARY: 478 mg/dL — AB (ref 65–99)

## 2015-09-17 LAB — CBC
HEMATOCRIT: 44.5 % (ref 35.0–47.0)
Hemoglobin: 14.5 g/dL (ref 12.0–16.0)
MCH: 29.1 pg (ref 26.0–34.0)
MCHC: 32.6 g/dL (ref 32.0–36.0)
MCV: 89.2 fL (ref 80.0–100.0)
Platelets: 150 10*3/uL (ref 150–440)
RBC: 4.99 MIL/uL (ref 3.80–5.20)
RDW: 13.6 % (ref 11.5–14.5)
WBC: 13.2 10*3/uL — ABNORMAL HIGH (ref 3.6–11.0)

## 2015-09-17 MED ORDER — IOHEXOL 240 MG/ML SOLN
25.0000 mL | Freq: Once | INTRAMUSCULAR | Status: AC | PRN
  Administered 2015-09-17: 25 mL via ORAL

## 2015-09-17 MED ORDER — SODIUM CHLORIDE 0.9 % IV BOLUS (SEPSIS)
1000.0000 mL | INTRAVENOUS | Status: AC
  Administered 2015-09-17: 1000 mL via INTRAVENOUS

## 2015-09-17 MED ORDER — ONDANSETRON HCL 4 MG/2ML IJ SOLN
4.0000 mg | INTRAMUSCULAR | Status: AC
  Administered 2015-09-17: 4 mg via INTRAVENOUS
  Filled 2015-09-17: qty 2

## 2015-09-17 NOTE — ED Notes (Addendum)
Pt to ED from home via EMS with abd pain/bloating , bilateral lower quads along with nausea/vomiting. Pt last BM x 6 days ago, been going to [facility] for enemas and suppositories without relief. Pt AAOx3, dry heaving on arrival, states pain 8/10 at this time. All vitals wnl.

## 2015-09-17 NOTE — ED Notes (Signed)
Lab called regarding blood, will add on.

## 2015-09-17 NOTE — ED Provider Notes (Signed)
[facility] Emergency Department Provider Note  ____________________________________________  Time seen: Approximately 11:11 PM  I have reviewed the triage vital signs and the nursing notes.   HISTORY  Chief Complaint Abdominal Pain; Nausea; Emesis; and Constipation    HPI [name] Munoz is a 69 y.o. female who presents by EMS for abdominal pain, bloating that she says is due to constipation for 6 days, and persistent nausea and vomiting today.  She states that she does have chronic constipation but that it has been much worse than usual and that she has not had a "good" bowel movement for 6 days.  She goes to [facility] for her medical care and she went there today and had an enema and some laxatives, but she was not able to produce anything even after the enema.  She was dry heaving upon arrival to the emergency department and states that she is in severe pain which is most notable in the right lower quadrant.  She describes the abdominal pain as fullness/bloating with occasional sharp and stabbing pains that radiate throughout her lower abdomen.  She has mild tachycardia upon arrival and denies fever/chills, chest pain, shortness of breath.  She has not been able to eat or drink very much as a result of her bloating and the frequent emesis and she is concerned she may be dehydrated.  Overall she describes her symptoms as severe.   Past Medical History  Diagnosis Date  . Diabetes mellitus without complication (HCC)   . Hypertension   . Coronary artery disease     There are no active problems to display for this patient.   History reviewed. No pertinent past surgical history.  Current Outpatient Rx  Name  Route  Sig  Dispense  Refill  . ondansetron (ZOFRAN) 4 MG tablet      Take 1-2 tabs by mouth every 8 hours as needed for nausea/vomiting   30 tablet   0   . pantoprazole (PROTONIX) 40 MG tablet   Oral   Take 1 tablet (40 mg total) by mouth 2 (two) times  daily.   60 tablet   1   . sucralfate (CARAFATE) 1 GM/10ML suspension   Oral   Take 10 mLs (1 g total) by mouth 4 (four) times daily.   420 mL   1     Allergies Review of patient's allergies indicates no known allergies.  History reviewed. No pertinent family history.  Social History Social History  Substance Use Topics  . Smoking status: Never Smoker   . Smokeless tobacco: None  . Alcohol Use: No    Review of Systems Constitutional: No fever/chills Eyes: No visual changes. ENT: No sore throat. Cardiovascular: Denies chest pain. Respiratory: Denies shortness of breath. Gastrointestinal: Lower abdominal pain worse on the right lower quadrant.  Persistent nausea and vomiting throughout the day.  Constipation 6 days. Genitourinary: Negative for dysuria. Musculoskeletal: Negative for back pain. Skin: Negative for rash. Neurological: Negative for headaches, focal weakness or numbness.  10-point ROS otherwise negative.  ____________________________________________   PHYSICAL EXAM:  ED Triage Vitals  Enc Vitals Group     BP 09/17/15 2307 139/72 mmHg     Pulse Rate 09/17/15 2307 105     Resp 09/17/15 2307 18     Temp 09/17/15 2307 98.5 F (36.9 C)     Temp Source 09/17/15 2307 Oral     SpO2 09/17/15 2307 100 %     Weight 09/17/15 2307 235 lb (106.595 kg)  Height 09/17/15 2307 5\' 9"  (1.753 m)     Head Cir --      Peak Flow --      Pain Score 09/17/15 2303 8     Pain Loc --      Pain Edu? --      Excl. in GC? --     Constitutional: Alert and oriented. Well appearing and in no acute distress. Eyes: Conjunctivae are normal. PERRL. EOMI. Head: Atraumatic. Nose: No congestion/rhinnorhea. Mouth/Throat: Mucous membranes are moist.  Oropharynx non-erythematous. Neck: No stridor.   Cardiovascular: Mild tachycardia, regular rhythm. Grossly normal heart sounds.  Good peripheral circulation. Respiratory: Normal respiratory effort.  No retractions. Lungs  CTAB. Gastrointestinal: Obese, Soft and generally nontender but does have some mild tenderness to palpation of the right lower quadrant. No clinically significant distention. No abdominal bruits. No CVA tenderness. Rectal:  No stool is present in the rectal vault, Hemoccult negative with quality control passed Musculoskeletal: No lower extremity tenderness nor edema.  No joint effusions. Neurologic:  Normal speech and language. No gross focal neurologic deficits are appreciated.  Skin:  Skin is warm, dry and intact. No rash noted. Psychiatric: Mood and affect are normal. Speech and behavior are normal.  ____________________________________________   LABS (all labs ordered are listed, but only abnormal results are displayed)  Labs Reviewed  GLUCOSE, CAPILLARY - Abnormal; Notable for the following:    Glucose-Capillary 478 (*)    All other components within normal limits  LIPASE, BLOOD - Abnormal; Notable for the following:    Lipase 52 (*)    All other components within normal limits  COMPREHENSIVE METABOLIC PANEL - Abnormal; Notable for the following:    Chloride 97 (*)    Glucose, Bld 528 (*)    BUN 33 (*)    Creatinine, Ser 1.89 (*)    Total Bilirubin 1.9 (*)    GFR calc non Af Amer 26 (*)    GFR calc Af Amer 30 (*)    Anion gap 16 (*)    All other components within normal limits  CBC - Abnormal; Notable for the following:    WBC 13.2 (*)    All other components within normal limits  GLUCOSE, CAPILLARY - Abnormal; Notable for the following:    Glucose-Capillary 470 (*)    All other components within normal limits   ____________________________________________  EKG  Not indicated ____________________________________________  RADIOLOGY   Ct Abdomen Pelvis Wo Contrast  09/18/2015  CLINICAL DATA:  Abdominal pain with nausea, vomiting, and constipation for 6 days. Chronic kidney disease. EXAM: CT ABDOMEN AND PELVIS WITHOUT CONTRAST TECHNIQUE: Multidetector CT imaging of  the abdomen and pelvis was performed following the standard protocol without IV contrast. COMPARISON:  CT 11/02/2014 FINDINGS: Lower chest:  The included lung bases are clear. Liver: Enlarged liver measuring at least 21 cm craniocaudal dimension with hepatic steatosis. No evidence focal lesion allowing for lack contrast. Hepatobiliary: Gallbladder physiologically distended. No calcified stone. No biliary dilatation. Pancreas: No ductal dilatation or inflammation, mildly atrophic. Spleen: Normal in size. Small perisplenic calcification, may reflect a small calcified lymph node. Splenule noted anteriorly. Adrenal glands: Left adrenal nodule measures 1.9 cm, mild adjacent induration at the parrot renal silhouettes is similar to prior exam. Right adrenal gland is normal. Kidneys: No hydronephrosis or obstructive uropathy. No nephrolithiasis. Left renal cysts including a parapelvic cyst, unchanged from prior exam. No perinephric stranding. Stomach/Bowel: Stomach physiologically distended. Small hiatal hernia. Distal esophageal thickening with mild adjacent soft tissue stranding. There  are no dilated or thickened small bowel loops. Small volume of stool throughout the colon without colonic wall thickening. The appendix is normal. Vascular/Lymphatic: No retroperitoneal adenopathy. Abdominal aorta is normal in caliber. Moderate atherosclerosis without aneurysm. Reproductive: Uterus and ovaries normal in size.  No adnexal mass. Bladder: Bladder physiologically distended. Other: No free air, free fluid, or intra-abdominal fluid collection. Musculoskeletal: There are no acute or suspicious osseous abnormalities. Degenerative change of both hips and throughout the spine. IMPRESSION: 1. Distal esophageal wall thickening with adjacent soft tissue stranding, suggestive of esophagitis. Small hiatal hernia. 2. No acute abnormality in the abdomen or pelvis. 3. Unchanged size of left adrenal nodule measuring 1.9 cm. There is mild  adjacent soft tissue stranding that is also unchanged, of doubtful clinical significance. Electronically Signed   By: Rubye Oaks M.D.   On: 09/18/2015 02:11    ____________________________________________   PROCEDURES  Procedure(s) performed: None  Critical Care performed: No ____________________________________________   INITIAL IMPRESSION / ASSESSMENT AND PLAN / ED COURSE  Pertinent labs & imaging results that were available during my care of the patient were reviewed by me and considered in my medical decision making (see chart for details).  The patient has had acute on chronic constipation for the last 6 days.  She has no evidence of fecal impaction on digital exam and in fact does not have any stool in the rectal vault.  Given her persistent vomiting as well as the pain she is experiencing I am concerned about the possibility of ileus versus SBO versus mass, although she had a colonoscopy and endoscopy within the last year according to her own verbal report.  I will proceed with normal lab workup, 1 L normal saline, and a CT scan with by mouth and IV contrast for further evaluation of her abdomen. ----------------------------------------- 4:24 AM on 09/18/2015 -----------------------------------------  The patient had a reassuring workup.  Her chronic renal disease is stable.  She has had a few episodes of spitting up but no large volume emesis.  Her CT scan is consistent with esophagitis.  I discussed with her the plan for outpatient medications as listed below and follow up with GI.  She understands and agrees with this plan.  She has good follow-up through [facility].  ____________________________________________  FINAL CLINICAL IMPRESSION(S) / ED DIAGNOSES  Final diagnoses:  Esophagitis  Nausea and vomiting, vomiting of unspecified type      NEW MEDICATIONS STARTED DURING THIS VISIT:  New Prescriptions   ONDANSETRON (ZOFRAN) 4 MG TABLET    Take 1-2 tabs by mouth  every 8 hours as needed for nausea/vomiting   PANTOPRAZOLE (PROTONIX) 40 MG TABLET    Take 1 tablet (40 mg total) by mouth 2 (two) times daily.   SUCRALFATE (CARAFATE) 1 GM/10ML SUSPENSION    Take 10 mLs (1 g total) by mouth 4 (four) times daily.     [name], MD 09/18/15 [phone]

## 2015-09-17 NOTE — ED Notes (Signed)
MD Forbach at bedside. 

## 2015-09-18 ENCOUNTER — Encounter: Payer: Self-pay | Admitting: Internal Medicine

## 2015-09-18 ENCOUNTER — Emergency Department: Payer: Medicare (Managed Care)

## 2015-09-18 DIAGNOSIS — R1110 Vomiting, unspecified: Secondary | ICD-10-CM | POA: Diagnosis present

## 2015-09-18 LAB — COMPREHENSIVE METABOLIC PANEL
ALBUMIN: 4.3 g/dL (ref 3.5–5.0)
ALT: 14 U/L (ref 14–54)
ANION GAP: 16 — AB (ref 5–15)
AST: 18 U/L (ref 15–41)
Alkaline Phosphatase: 100 U/L (ref 38–126)
BUN: 33 mg/dL — AB (ref 6–20)
CALCIUM: 9.5 mg/dL (ref 8.9–10.3)
CO2: 23 mmol/L (ref 22–32)
Chloride: 97 mmol/L — ABNORMAL LOW (ref 101–111)
Creatinine, Ser: 1.89 mg/dL — ABNORMAL HIGH (ref 0.44–1.00)
GFR calc Af Amer: 30 mL/min — ABNORMAL LOW (ref >60)
GFR calc non Af Amer: 26 mL/min — ABNORMAL LOW (ref >60)
GLUCOSE: 528 mg/dL — AB (ref 65–99)
POTASSIUM: 4.3 mmol/L (ref 3.5–5.1)
SODIUM: 136 mmol/L (ref 135–145)
TOTAL PROTEIN: 7.8 g/dL (ref 6.5–8.1)
Total Bilirubin: 1.9 mg/dL — ABNORMAL HIGH (ref 0.3–1.2)

## 2015-09-18 LAB — GLUCOSE, CAPILLARY
Glucose-Capillary: 470 mg/dL — ABNORMAL HIGH (ref 65–99)
Glucose-Capillary: 419 mg/dL — ABNORMAL HIGH (ref 65–99)
Glucose-Capillary: 217 mg/dL — ABNORMAL HIGH (ref 65–99)
Glucose-Capillary: 198 mg/dL — ABNORMAL HIGH (ref 65–99)
Glucose-Capillary: 186 mg/dL — ABNORMAL HIGH (ref 65–99)
Glucose-Capillary: 214 mg/dL — ABNORMAL HIGH (ref 65–99)

## 2015-09-18 LAB — LIPASE, BLOOD: Lipase: 52 U/L — ABNORMAL HIGH (ref 11–51)

## 2015-09-18 LAB — HEMOGLOBIN A1C: Hgb A1c MFr Bld: 10.9 % — ABNORMAL HIGH (ref 4.0–6.0)

## 2015-09-18 LAB — TSH: TSH: 4.702 u[IU]/mL — ABNORMAL HIGH (ref 0.350–4.500)

## 2015-09-18 MED ORDER — ONDANSETRON HCL 4 MG PO TABS: ORAL_TABLET | ORAL | Status: DC

## 2015-09-18 MED ORDER — PANTOPRAZOLE SODIUM 40 MG IV SOLR
40.0000 mg | Freq: Once | INTRAVENOUS | Status: AC
  Administered 2015-09-18: 40 mg via INTRAVENOUS
  Filled 2015-09-18: qty 40

## 2015-09-18 MED ORDER — IOHEXOL 240 MG/ML SOLN
25.0000 mL | Freq: Once | INTRAMUSCULAR | Status: AC | PRN
  Administered 2015-09-18: 25 mL via ORAL

## 2015-09-18 MED ORDER — PROCHLORPERAZINE EDISYLATE 5 MG/ML IJ SOLN
10.0000 mg | Freq: Four times a day (QID) | INTRAMUSCULAR | Status: DC | PRN
  Filled 2015-09-18: qty 2

## 2015-09-18 MED ORDER — INSULIN ASPART 100 UNIT/ML ~~LOC~~ SOLN
0.0000 [IU] | Freq: Three times a day (TID) | SUBCUTANEOUS | Status: DC
  Administered 2015-09-18: 5 [IU] via SUBCUTANEOUS
  Administered 2015-09-18: 15 [IU] via SUBCUTANEOUS
  Administered 2015-09-18 – 2015-09-19 (×2): 3 [IU] via SUBCUTANEOUS
  Administered 2015-09-19: 5 [IU] via SUBCUTANEOUS
  Administered 2015-09-19: 2 [IU] via SUBCUTANEOUS
  Administered 2015-09-20: 3 [IU] via SUBCUTANEOUS
  Filled 2015-09-18: qty 3
  Filled 2015-09-18: qty 2
  Filled 2015-09-18 (×2): qty 3
  Filled 2015-09-18 (×2): qty 5
  Filled 2015-09-18: qty 15

## 2015-09-18 MED ORDER — SUCRALFATE 1 GM/10ML PO SUSP: 1.0000 g | Freq: Four times a day (QID) | ORAL | Status: DC

## 2015-09-18 MED ORDER — SODIUM CHLORIDE 0.9 % IV BOLUS (SEPSIS)
1000.0000 mL | INTRAVENOUS | Status: AC
  Administered 2015-09-18: 1000 mL via INTRAVENOUS

## 2015-09-18 MED ORDER — ONDANSETRON HCL 4 MG/2ML IJ SOLN
INTRAMUSCULAR | Status: AC
  Filled 2015-09-18: qty 2

## 2015-09-18 MED ORDER — MORPHINE SULFATE (PF) 2 MG/ML IV SOLN: 2.0000 mg | INTRAVENOUS | Status: DC | PRN

## 2015-09-18 MED ORDER — ONDANSETRON HCL 4 MG PO TABS: 4.0000 mg | ORAL_TABLET | Freq: Four times a day (QID) | ORAL | Status: DC | PRN

## 2015-09-18 MED ORDER — ACETAMINOPHEN 325 MG PO TABS: 650.0000 mg | ORAL_TABLET | Freq: Four times a day (QID) | ORAL | Status: DC | PRN

## 2015-09-18 MED ORDER — INSULIN GLARGINE 100 UNIT/ML ~~LOC~~ SOLN
38.0000 [IU] | Freq: Every day | SUBCUTANEOUS | Status: DC
  Administered 2015-09-18 – 2015-09-19 (×2): 38 [IU] via SUBCUTANEOUS
  Filled 2015-09-18 (×3): qty 0.38

## 2015-09-18 MED ORDER — PANTOPRAZOLE SODIUM 40 MG PO TBEC: 40.0000 mg | DELAYED_RELEASE_TABLET | Freq: Two times a day (BID) | ORAL | Status: DC

## 2015-09-18 MED ORDER — ONDANSETRON HCL 4 MG/2ML IJ SOLN
4.0000 mg | Freq: Once | INTRAMUSCULAR | Status: AC
  Administered 2015-09-18: 4 mg via INTRAVENOUS
  Filled 2015-09-18: qty 2

## 2015-09-18 MED ORDER — ACETAMINOPHEN 650 MG RE SUPP: 650.0000 mg | Freq: Four times a day (QID) | RECTAL | Status: DC | PRN

## 2015-09-18 MED ORDER — DOCUSATE SODIUM 100 MG PO CAPS
100.0000 mg | ORAL_CAPSULE | Freq: Two times a day (BID) | ORAL | Status: DC
  Administered 2015-09-18 – 2015-09-20 (×5): 100 mg via ORAL
  Filled 2015-09-18 (×4): qty 1

## 2015-09-18 MED ORDER — INSULIN ASPART 100 UNIT/ML ~~LOC~~ SOLN
0.0000 [IU] | Freq: Every day | SUBCUTANEOUS | Status: DC
  Administered 2015-09-18: 2 [IU] via SUBCUTANEOUS
  Filled 2015-09-18: qty 2

## 2015-09-18 MED ORDER — PROCHLORPERAZINE 25 MG RE SUPP
25.0000 mg | Freq: Two times a day (BID) | RECTAL | Status: DC | PRN
  Filled 2015-09-18: qty 1

## 2015-09-18 MED ORDER — ONDANSETRON HCL 4 MG/2ML IJ SOLN: 4.0000 mg | Freq: Four times a day (QID) | INTRAMUSCULAR | Status: DC | PRN

## 2015-09-18 MED ORDER — SODIUM CHLORIDE 0.9 % IV SOLN
INTRAVENOUS | Status: DC
  Administered 2015-09-18 – 2015-09-20 (×4): via INTRAVENOUS

## 2015-09-18 MED ORDER — ONDANSETRON HCL 4 MG/2ML IJ SOLN
4.0000 mg | Freq: Once | INTRAMUSCULAR | Status: AC
  Administered 2015-09-18: 4 mg via INTRAVENOUS

## 2015-09-18 MED ORDER — INSULIN ASPART 100 UNIT/ML ~~LOC~~ SOLN: 0.0000 [IU] | Freq: Three times a day (TID) | SUBCUTANEOUS | Status: DC

## 2015-09-18 MED ORDER — SCOPOLAMINE 1 MG/3DAYS TD PT72
1.0000 | MEDICATED_PATCH | TRANSDERMAL | Status: DC
  Administered 2015-09-18: 1.5 mg via TRANSDERMAL
  Filled 2015-09-18 (×2): qty 1

## 2015-09-18 MED ORDER — INSULIN ASPART 100 UNIT/ML ~~LOC~~ SOLN
0.0000 [IU] | Freq: Every day | SUBCUTANEOUS | Status: DC
Start: 2015-09-18 — End: 2015-09-18

## 2015-09-18 MED ORDER — METOCLOPRAMIDE HCL 5 MG PO TABS
5.0000 mg | ORAL_TABLET | Freq: Four times a day (QID) | ORAL | Status: DC | PRN
  Administered 2015-09-19 – 2015-09-20 (×2): 5 mg via ORAL
  Filled 2015-09-18 (×2): qty 1

## 2015-09-18 NOTE — Discharge Instructions (Signed)
You have been seen in the Emergency Department (ED) for abdominal pain and nausea/vomiting.  Your workup is consistent with esophagitis.  We have prescribed several medicines that will help you with this, and we encourage you to follow up with your GI doctor or with Dr. Mechele Collin at the number listed.  Please follow up as instructed above regarding todays emergent visit and the symptoms that are bothering you.  Return to the ED if your abdominal pain worsens or fails to improve, you develop bloody vomiting, bloody diarrhea, you are unable to tolerate fluids due to vomiting, fever greater than 101, or other symptoms that concern you.   Esophagitis Esophagitis is inflammation of the esophagus. The esophagus is the tube that carries food and liquids from your mouth to your stomach. Esophagitis can cause soreness or pain in the esophagus. This condition can make it difficult and painful to swallow.  CAUSES Most causes of esophagitis are not serious. Common causes of this condition include:  Gastroesophageal reflux disease (GERD). This is when stomach contents move back up into the esophagus (reflux).  Repeated vomiting.  An allergic-type reaction, especially caused by food allergies (eosinophilic esophagitis).  Injury to the esophagus by swallowing large pills with or without water, or swallowing certain types of medicines.  Swallowing (ingesting) harmful chemicals, such as household cleaning products.  Heavy alcohol use.  An infection of the esophagus.This most often occurs in people who have a weakened immune system.  Radiation or chemotherapy treatment for cancer.  Certain diseases such as sarcoidosis, Crohn disease, and scleroderma. SYMPTOMS Symptoms of this condition include:  Difficult or painful swallowing.  Pain with swallowing acidic liquids, such as citrus juices.  Pain with burping.  Chest pain.  Difficulty breathing.  Nausea.  Vomiting.  Pain in the  abdomen.  Weight loss.  Ulcers in the mouth.  Patches of white material in the mouth (candidiasis).  Fever.  Coughing up blood or vomiting blood.  Stool that is black, tarry, or bright red. DIAGNOSIS Your health care provider will take a medical history and perform a physical exam. You may also have other tests, including:  An endoscopy to examine your stomach and esophagus with a small camera.  A test that measures the acidity level in your esophagus.  A test that measures how much pressure is on your esophagus.  A barium swallow or modified barium swallow to show the shape, size, and functioning of your esophagus.  Allergy tests. TREATMENT Treatment for this condition depends on the cause of your esophagitis. In some cases, steroids or other medicines may be given to help relieve your symptoms or to treat the underlying cause of your condition. You may have to make some lifestyle changes, such as:  Avoiding alcohol.  Quitting smoking.  Changing your diet.  Exercising.  Changing your sleep habits and your sleep environment. HOME CARE INSTRUCTIONS Take these actions to decrease your discomfort and to help avoid complications. Diet  Follow a diet as recommended by your health care provider. This may involve avoiding foods and drinks such as:  Coffee and tea (with or without caffeine).  Drinks that contain alcohol.  Energy drinks and sports drinks.  Carbonated drinks or sodas.  Chocolate and cocoa.  Peppermint and mint flavorings.  Garlic and onions.  Horseradish.  Spicy and acidic foods, including peppers, chili powder, curry powder, vinegar, hot sauces, and barbecue sauce.  Citrus fruit juices and citrus fruits, such as oranges, lemons, and limes.  Tomato-based foods, such as red sauce,  chili, salsa, and pizza with red sauce.  Fried and fatty foods, such as donuts, french fries, potato chips, and high-fat dressings.  High-fat meats, such as hot dogs  and fatty cuts of red and white meats, such as rib eye steak, sausage, ham, and bacon.  High-fat dairy items, such as whole milk, butter, and cream cheese.  Eat small, frequent meals instead of large meals.  Avoid drinking large amounts of liquid with your meals.  Avoid eating meals during the 2-3 hours before bedtime.  Avoid lying down right after you eat.  Do not exercise right after you eat.  Avoid foods and drinks that seem to make your symptoms worse. General Instructions  Pay attention to any changes in your symptoms.  Take over-the-counter and prescription medicines only as told by your health care provider. Do not take aspirin, ibuprofen, or other NSAIDs unless your health care provider told you to do so.  If you have trouble taking pills, use a pill splitter to decrease the size of the pill. This will decrease the chance of the pill getting stuck or injuring your esophagus on the way down. Also, drink water after you take a pill.  Do not use any tobacco products, including cigarettes, chewing tobacco, and e-cigarettes. If you need help quitting, ask your health care provider.  Wear loose-fitting clothing. Do not wear anything tight around your waist that causes pressure on your abdomen.  Raise (elevate) the head of your bed about 6 inches (15 cm).  Try to reduce your stress, such as with yoga or meditation. If you need help reducing stress, ask your health care provider.  If you are overweight, reduce your weight to an amount that is healthy for you. Ask your health care provider for guidance about a safe weight loss goal.  Keep all follow-up visits as told by your health care provider. This is important. SEEK MEDICAL CARE IF:  You have new symptoms.  You have unexplained weight loss.  You have difficulty swallowing, or it hurts to swallow.  You have wheezing or a persistent cough.  Your symptoms do not improve with treatment.  You have frequent heartburn for  more than two weeks. SEEK IMMEDIATE MEDICAL CARE IF:  You have severe pain in your arms, neck, jaw, teeth, or back.  You feel sweaty, dizzy, or light-headed.  You have chest pain or shortness of breath.  You vomit and your vomit looks like blood or coffee grounds.  Your stool is bloody or black.  You have a fever.  You cannot swallow, drink, or eat.   This information is not intended to replace advice given to you by your health care provider. Make sure you discuss any questions you have with your health care provider.   Document Released: 11/23/2004 Document Revised: 07/07/2015 Document Reviewed: 02/10/2015 Elsevier Interactive Patient Education 2016 [employer]  Nausea and Vomiting Nausea is a sick feeling that often comes before throwing up (vomiting). Vomiting is a reflex where stomach contents come out of your mouth. Vomiting can cause severe loss of body fluids (dehydration). Children and elderly adults can become dehydrated quickly, especially if they also have diarrhea. Nausea and vomiting are symptoms of a condition or disease. It is important to find the cause of your symptoms. CAUSES   Direct irritation of the stomach lining. This irritation can result from increased acid production (gastroesophageal reflux disease), infection, food poisoning, taking certain medicines (such as nonsteroidal anti-inflammatory drugs), alcohol use, or tobacco use.  Signals from  the brain.These signals could be caused by a headache, heat exposure, an inner ear disturbance, increased pressure in the brain from injury, infection, a tumor, or a concussion, pain, emotional stimulus, or metabolic problems.  An obstruction in the gastrointestinal tract (bowel obstruction).  Illnesses such as diabetes, hepatitis, gallbladder problems, appendicitis, kidney problems, cancer, sepsis, atypical symptoms of a heart attack, or eating disorders.  Medical treatments such as chemotherapy and  radiation.  Receiving medicine that makes you sleep (general anesthetic) during surgery. DIAGNOSIS Your caregiver may ask for tests to be done if the problems do not improve after a few days. Tests may also be done if symptoms are severe or if the reason for the nausea and vomiting is not clear. Tests may include:  Urine tests.  Blood tests.  Stool tests.  Cultures (to look for evidence of infection).  X-rays or other imaging studies. Test results can help your caregiver make decisions about treatment or the need for additional tests. TREATMENT You need to stay well hydrated. Drink frequently but in small amounts.You may wish to drink water, sports drinks, clear broth, or eat frozen ice pops or gelatin dessert to help stay hydrated.When you eat, eating slowly may help prevent nausea.There are also some antinausea medicines that may help prevent nausea. HOME CARE INSTRUCTIONS   Take all medicine as directed by your caregiver.  If you do not have an appetite, do not force yourself to eat. However, you must continue to drink fluids.  If you have an appetite, eat a normal diet unless your caregiver tells you differently.  Eat a variety of complex carbohydrates (rice, wheat, potatoes, bread), lean meats, yogurt, fruits, and vegetables.  Avoid high-fat foods because they are more difficult to digest.  Drink enough water and fluids to keep your urine clear or pale yellow.  If you are dehydrated, ask your caregiver for specific rehydration instructions. Signs of dehydration may include:  Severe thirst.  Dry lips and mouth.  Dizziness.  Dark urine.  Decreasing urine frequency and amount.  Confusion.  Rapid breathing or pulse. SEEK IMMEDIATE MEDICAL CARE IF:   You have blood or brown flecks (like coffee grounds) in your vomit.  You have black or bloody stools.  You have a severe headache or stiff neck.  You are confused.  You have severe abdominal pain.  You have  chest pain or trouble breathing.  You do not urinate at least once every 8 hours.  You develop cold or clammy skin.  You continue to vomit for longer than 24 to 48 hours.  You have a fever. MAKE SURE YOU:   Understand these instructions.  Will watch your condition.  Will get help right away if you are not doing well or get worse.   This information is not intended to replace advice given to you by your health care provider. Make sure you discuss any questions you have with your health care provider.   Document Released: 10/16/2005 Document Revised: 01/08/2012 Document Reviewed: 03/15/2011 Elsevier Interactive Patient Education [employer] Inc[employer]

## 2015-09-18 NOTE — ED Notes (Signed)
Pt still feeling nauseated; assisted to sitting up in bed; pt coughing and then spitting up small amount clear liquid;

## 2015-09-18 NOTE — ED Notes (Signed)
Pt using call bell; says she's feeling some nausea but has started to drink her contrast; says she can finish with no extra nausea medication

## 2015-09-18 NOTE — Progress Notes (Signed)
[facility] - [place] at [facility]   PATIENT NAME: [name] Munoz    MR#:  [id number]  DATE OF BIRTH:  06-Oct-1946  SUBJECTIVE:  CHIEF COMPLAINT:   Chief Complaint  Patient presents with  . Abdominal Pain  . Nausea  . Emesis  . Constipation   Nausea is decreasing. She is thirsty and feels ready to eat. No further vomiting  REVIEW OF SYSTEMS:   Review of Systems  Constitutional: Negative for fever.  Respiratory: Negative for shortness of breath.   Cardiovascular: Negative for chest pain and palpitations.  Gastrointestinal: Positive for nausea and vomiting. Negative for abdominal pain.  Genitourinary: Negative for dysuria.    DRUG ALLERGIES:  No Known Allergies  VITALS:  Blood pressure 132/76, pulse 95, temperature 99.1 F (37.3 C), temperature source Oral, resp. rate 18, height  (1.753 m), weight 106.595 kg (235 lb), SpO2 97 %.  PHYSICAL EXAMINATION:  GENERAL:  69 y.o.-year-old patient lying in the bed with no acute distress.  LUNGS: Normal breath sounds bilaterally, no wheezing, rales,rhonchi or crepitation. No use of accessory muscles of respiration.  CARDIOVASCULAR: S1, S2 normal. No murmurs, rubs, or gallops.  ABDOMEN: Soft, nontender, nondistended. Bowel sounds present. No organomegaly or mass. No guarding no rebound EXTREMITIES: No pedal edema, cyanosis, or clubbing.  NEUROLOGIC: Cranial nerves II through XII are intact. Muscle strength 5/5 in all extremities. Sensation intact. Gait not checked.  PSYCHIATRIC: The patient is alert and oriented x 3.  SKIN: No obvious rash, lesion, or ulcer.    LABORATORY PANEL:   CBC  Recent Labs Lab 09/17/15 2315  WBC 13.2*  HGB 14.5  HCT 44.5  PLT 150   ------------------------------------------------------------------------------------------------------------------  Chemistries   Recent Labs Lab 09/17/15 2315  NA 136  K 4.3  CL 97*  CO2 23  GLUCOSE 528*  BUN 33*  CREATININE 1.89*   CALCIUM 9.5  AST 18  ALT 14  ALKPHOS 100  BILITOT 1.9*   ------------------------------------------------------------------------------------------------------------------  Cardiac Enzymes No results for input(s): TROPONINI in the last 168 hours. ------------------------------------------------------------------------------------------------------------------  RADIOLOGY:  Ct Abdomen Pelvis Wo Contrast  09/18/2015  CLINICAL DATA:  Abdominal pain with nausea, vomiting, and constipation for 6 days. Chronic kidney disease. EXAM: CT ABDOMEN AND PELVIS WITHOUT CONTRAST TECHNIQUE: Multidetector CT imaging of the abdomen and pelvis was performed following the standard protocol without IV contrast. COMPARISON:  CT 11/02/2014 FINDINGS: Lower chest:  The included lung bases are clear. Liver: Enlarged liver measuring at least 21 cm craniocaudal dimension with hepatic steatosis. No evidence focal lesion allowing for lack contrast. Hepatobiliary: Gallbladder physiologically distended. No calcified stone. No biliary dilatation. Pancreas: No ductal dilatation or inflammation, mildly atrophic. Spleen: Normal in size. Small perisplenic calcification, may reflect a small calcified lymph node. Splenule noted anteriorly. Adrenal glands: Left adrenal nodule measures 1.9 cm, mild adjacent induration at the parrot renal silhouettes is similar to prior exam. Right adrenal gland is normal. Kidneys: No hydronephrosis or obstructive uropathy. No nephrolithiasis. Left renal cysts including a parapelvic cyst, unchanged from prior exam. No perinephric stranding. Stomach/Bowel: Stomach physiologically distended. Small hiatal hernia. Distal esophageal thickening with mild adjacent soft tissue stranding. There are no dilated or thickened small bowel loops. Small volume of stool throughout the colon without colonic wall thickening. The appendix is normal. Vascular/Lymphatic: No retroperitoneal adenopathy. Abdominal aorta is normal in  caliber. Moderate atherosclerosis without aneurysm. Reproductive: Uterus and ovaries normal in size.  No adnexal mass. Bladder: Bladder physiologically distended. Other: No free air, free fluid,  or intra-abdominal fluid collection. Musculoskeletal: There are no acute or suspicious osseous abnormalities. Degenerative change of both hips and throughout the spine. IMPRESSION: 1. Distal esophageal wall thickening with adjacent soft tissue stranding, suggestive of esophagitis. Small hiatal hernia. 2. No acute abnormality in the abdomen or pelvis. 3. Unchanged size of left adrenal nodule measuring 1.9 cm. There is mild adjacent soft tissue stranding that is also unchanged, of doubtful clinical significance. Electronically Signed   By: Rubye OaksMelanie  Ehinger M.D.   On: 09/18/2015 02:11    EKG:   Orders placed or performed in visit on 05/30/13  . EKG 12-Lead    ASSESSMENT AND PLAN:   1. Intractable nausea and vomiting/Mallory-Weiss tear:  - Likely with gastroparesis in the setting of uncontrolled diabetes. I have ordered when necessary Reglan - Appreciate gastroenterology consultation. Plan for EGD on Monday for possible Mallory-Weiss tear - CT abdomen does show esophageal thickening  2. Acute on chronic kidney injury:  - Continue hydration. Reassess tomorrow  4. Diabetes mellitus type 2:  - Check A1c, continue sliding scale and basal insulin coverage  5. Essential hypertension: Continue lisinopril with hydrochlorothiazide  6. Coronary artery disease: Stable. Continue aspirin and Plavix  7. Depression: Continue Wellbutrin and Celexa  8. DVT prophylaxis: SCDs in the setting of hematemesis  9. GI prophylaxis: Pantoprazole per home regimen especially due to intractable vomiting    All the records are reviewed and case discussed with Care Management/Social Workerr. Management plans discussed with the patient, family and they are in agreement. I have also discussed her [facility]  CODE STATUS: Full  TOTAL TIME TAKING CARE OF THIS PATIENT: 20 minutes.  Greater than 50% of time spent in care coordination and counseling. POSSIBLE D/C IN 2 DAYS, DEPENDING ON CLINICAL CONDITION.   Elby ShowersWALSH, Ruel Dimmick M.D on 09/18/2015 at 3:16 PM  Between 7am to 6pm - Pager - [phone]  After 6pm go to [email or web address] - password [email or web address]  [facility]  Office  [phone]  CC: Primary care physician; Pcp Not In System

## 2015-09-18 NOTE — ED Notes (Signed)
Nausea easing; pt understands she needs to start drinking CT contrast; will use call bell when finished; encouraged to use call bell for any other urgent concerns

## 2015-09-18 NOTE — ED Notes (Signed)
Pt vomiting; spoke with MD; order given

## 2015-09-18 NOTE — Consult Note (Signed)
GI Inpatient Consult Note  Reason for Consult:Hematemesis   Attending Requesting Consult:  History of Present Illness: [name] Munoz is a 69 y.o. female with hx of diabetes for 30 years who sometimes misses a dose of insulin and will have spells where she is hypoglycemic and hyperglycemic and had onset of headache and abd pain and constipation.  She took laxatives and had vomiting ( which was likely due to hyperosmolar effect of hyperglycemia) and after several heaves she noticed first some BRB and later some dark blood (coffee grounds) she came to ER and was admitted for UGI bleed and hyperglycemia.  She has not vomited in 8 hours now and she said the anti nausea patch worked well. scopalamine patch. She now has sore throat from repeated vomting.  PMH had cardiac stent placed 10 years ago. Had BTL but no other abd surgery.  Past Medical History:  Past Medical History  Diagnosis Date  . Diabetes mellitus without complication (HCC)   . Hypertension   . Coronary artery disease     Problem List: Patient Active Problem List   Diagnosis Date Noted  . Intractable vomiting 09/18/2015    Past Surgical History: Past Surgical History  Procedure Laterality Date  . Percutaneous coronary rotoblator intervention (pci-r)      Allergies: No Known Allergies  Home Medications: Prescriptions prior to admission  Medication Sig Dispense Refill Last Dose  . aspirin EC 81 MG tablet Take 81 mg by mouth daily.   09/17/2015 at Unknown time  . atorvastatin (LIPITOR) 40 MG tablet Take 40 mg by mouth daily.   09/17/2015 at Unknown time  . buPROPion (WELLBUTRIN XL) 150 MG 24 hr tablet Take 150 mg by mouth daily.   09/17/2015 at Unknown time  . citalopram (CELEXA) 40 MG tablet Take 40 mg by mouth daily.   09/17/2015 at Unknown time  . clopidogrel (PLAVIX) 75 MG tablet Take 75 mg by mouth daily.   09/17/2015 at Unknown time  . ergocalciferol (VITAMIN D2) 50000 UNITS capsule Take 50,000 Units by mouth every 14  (fourteen) days. On the first and third Monday of each month   Past Month at Unknown time  . insulin glargine (LANTUS) 100 UNIT/ML injection Inject 50 Units into the skin daily.   09/17/2015 at Unknown time  . lisinopril-hydrochlorothiazide (PRINZIDE,ZESTORETIC) 20-25 MG tablet Take 1 tablet by mouth daily.   09/17/2015 at Unknown time  . metoprolol succinate (TOPROL-XL) 25 MG 24 hr tablet Take 25 mg by mouth daily.   09/17/2015 at Unknown time  . pioglitazone (ACTOS) 15 MG tablet Take 15 mg by mouth daily.   09/17/2015 at Unknown time  . polyethylene glycol (MIRALAX / GLYCOLAX) packet Take 17 g by mouth daily as needed for mild constipation.   prn at prn  . pregabalin (LYRICA) 75 MG capsule Take 75 mg by mouth 3 (three) times daily.   09/17/2015 at Unknown time  . ranitidine (ZANTAC) 150 MG tablet Take 150 mg by mouth 2 (two) times daily.   09/17/2015 at Unknown time   Home medication reconciliation was completed with the patient.   Scheduled Inpatient Medications:   . docusate sodium  100 mg Oral BID  . insulin aspart  0-15 Units Subcutaneous TID WC  . insulin aspart  0-5 Units Subcutaneous QHS  . insulin glargine  38 Units Subcutaneous QHS  . ondansetron      . scopolamine  1 patch Transdermal Q72H    Continuous Inpatient Infusions:   . sodium chloride 125  mL/hr at 09/18/15 0707    PRN Inpatient Medications:  acetaminophen **OR** acetaminophen, metoCLOPramide, morphine injection, prochlorperazine, prochlorperazine  Family History: family history includes Diabetes Mellitus I in her other; Diabetes Mellitus II in her maternal aunt.  The patient's family history is negative for inflammatory bowel disorders, GI malignancy, or solid organ transplantation.  Social History:   reports that she has never smoked. She does not have any smokeless tobacco history on file. She reports that she does not drink alcohol. The patient denies ETOH, tobacco, or drug use.   Review of  Systems: Constitutional: Weight is stable.  Eyes: No changes in vision. ENT: No oral lesions, sore throat.  GI: see HPI.  Heme/Lymph: No easy bruising.  CV: No chest pain.  GU: No hematuria.  Integumentary: No rashes.  Neuro: No headaches.  Psych: Has spells of depression, several relatives have died in last 10 years. Endocrine: No heat/cold intolerance.  Allergic/Immunologic: No urticaria.  Resp: No cough, SOB.  Musculoskeletal: No joint swelling.    Physical Examination: BP 132/76 mmHg  Pulse 95  Temp(Src) 99.1 F (37.3 C) (Oral)  Resp 18  Ht  (1.753 m)  Wt 106.595 kg (235 lb)  BMI 34.69 kg/m2  SpO2 97%  LMP  Gen: NAD, alert and oriented x 4 HEENT: PEERLA, EOMI, Neck: supple, no JVD or thyromegaly Chest: CTA bilaterally, no wheezes, crackles, or other adventitious sounds CV: RRR, no m/g/c/r Abd: soft, NT, ND, +BS in all four quadrants; no HSM, guarding, ridigity, or rebound tenderness, mild tenderness in epigastric area. Ext: no edema, well perfused with 2+ pulses, Skin: no rash or lesions noted Lymph: no LAD  Data: Lab Results  Component Value Date   WBC 13.2* 09/17/2015   HGB 14.5 09/17/2015   HCT 44.5 09/17/2015   MCV 89.2 09/17/2015   PLT 150 09/17/2015    Recent Labs Lab 09/17/15 2315  HGB 14.5   Lab Results  Component Value Date   NA 136 09/17/2015   K 4.3 09/17/2015   CL 97* 09/17/2015   CO2 23 09/17/2015   BUN 33* 09/17/2015   CREATININE 1.89* 09/17/2015   Lab Results  Component Value Date   ALT 14 09/17/2015   AST 18 09/17/2015   ALKPHOS 100 09/17/2015   BILITOT 1.9* 09/17/2015   No results for input(s): APTT, INR, PTT in the last 168 hours. Assessment/Plan: [name] Munoz is a 69 y.o. female with vomiting likely due to hyperosmolar state, plan to start water and popcycles and do EGD Monday since there has been no vomiting for 8 hours.  Continue PPI iv, continue antinausea meds.  Recommendations:  Thank you for the consult. Please  call with questions or concerns.  Lynnae Prude, MD

## 2015-09-18 NOTE — ED Notes (Signed)
Pt laying on right side; denies pain; says no longer coughing and vomiting;

## 2015-09-18 NOTE — ED Notes (Signed)
Pt vomited again, after IV zofran; brownish colored emesis; MD notified; calling hospitalist to admit

## 2015-09-18 NOTE — H&P (Addendum)
[name] Munoz is an 68 y.o. female.   Chief Complaint: Constipation HPI: The patient presents emergency department complaining of constipation and abdominal cramping. She states this is been a progressively worsening problem for the last week. She chronically has occasional bouts of constipation. She had seen her primary care physician with these complaints a few days ago and was prescribed some laxatives. She's been unable to keep laxatives down and having nonbilious vomiting. Today however she has seen some blood-tinged emesis and nursing staff reports coffee-ground emesis. The patient received multiple doses of antiemetics in the emergency department without relief. Laboratory evaluation reveals acute on chronic kidney injury as well as hyperglycemia found to the emergency department staff to call for admission.  Past Medical History  Diagnosis Date  . Diabetes mellitus without complication ([place])   . Hypertension   . Coronary artery disease     Past Surgical History  Procedure Laterality Date  . Percutaneous coronary rotoblator intervention (pci-r)      Family History  Problem Relation Age of Onset  . Diabetes Mellitus II Maternal Aunt   . Diabetes Mellitus I Other    Social History:  reports that she has never smoked. She does not have any smokeless tobacco history on file. She reports that she does not drink alcohol. Her drug history is not on file.  Allergies: No Known Allergies  Medications Prior to Admission  Medication Sig Dispense Refill  . aspirin EC 81 MG tablet Take 81 mg by mouth daily.    Marland Kitchen atorvastatin (LIPITOR) 40 MG tablet Take 40 mg by mouth daily.    Marland Kitchen buPROPion (WELLBUTRIN XL) 150 MG 24 hr tablet Take 150 mg by mouth daily.    . citalopram (CELEXA) 40 MG tablet Take 40 mg by mouth daily.    . clopidogrel (PLAVIX) 75 MG tablet Take 75 mg by mouth daily.    . ergocalciferol (VITAMIN D2) 50000 UNITS capsule Take 50,000 Units by mouth every 14 (fourteen) days. On the  first and third Monday of each month    . insulin glargine (LANTUS) 100 UNIT/ML injection Inject 50 Units into the skin daily.    Marland Kitchen lisinopril-hydrochlorothiazide (PRINZIDE,ZESTORETIC) 20-25 MG tablet Take 1 tablet by mouth daily.    . metoprolol succinate (TOPROL-XL) 25 MG 24 hr tablet Take 25 mg by mouth daily.    . pioglitazone (ACTOS) 15 MG tablet Take 15 mg by mouth daily.    . polyethylene glycol (MIRALAX / GLYCOLAX) packet Take 17 g by mouth daily as needed for mild constipation.    . pregabalin (LYRICA) 75 MG capsule Take 75 mg by mouth 3 (three) times daily.    . ranitidine (ZANTAC) 150 MG tablet Take 150 mg by mouth 2 (two) times daily.      Results for orders placed or performed during the hospital encounter of 09/17/15 (from the past 48 hour(s))  Glucose, capillary     Status: Abnormal   Collection Time: 09/17/15 11:06 PM  Result Value Ref Range   Glucose-Capillary 478 (H) 65 - 99 mg/dL  Lipase, blood     Status: Abnormal   Collection Time: 09/17/15 11:15 PM  Result Value Ref Range   Lipase 52 (H) 11 - 51 U/L  Comprehensive metabolic panel     Status: Abnormal   Collection Time: 09/17/15 11:15 PM  Result Value Ref Range   Sodium 136 135 - 145 mmol/L   Potassium 4.3 3.5 - 5.1 mmol/L   Chloride 97 (L) 101 - 111 mmol/L  CO2 23 22 - 32 mmol/L   Glucose, Bld 528 (HH) 65 - 99 mg/dL    Comment: CRITICAL RESULT CALLED TO, READ BACK BY AND VERIFIED WITH REBECCA LYNN AT 0016 09/18/15.PMH   BUN 33 (H) 6 - 20 mg/dL   Creatinine, Ser 1.89 (H) 0.44 - 1.00 mg/dL   Calcium 9.5 8.9 - 10.3 mg/dL   Total Protein 7.8 6.5 - 8.1 g/dL   Albumin 4.3 3.5 - 5.0 g/dL   AST 18 15 - 41 U/L   ALT 14 14 - 54 U/L   Alkaline Phosphatase 100 38 - 126 U/L   Total Bilirubin 1.9 (H) 0.3 - 1.2 mg/dL   GFR calc non Af Amer 26 (L) >60 mL/min   GFR calc Af Amer 30 (L) >60 mL/min    Comment: (NOTE) The eGFR has been calculated using the CKD EPI equation. This calculation has not been validated in all  clinical situations. eGFR's persistently <60 mL/min signify possible Chronic Kidney Disease.    Anion gap 16 (H) 5 - 15  CBC     Status: Abnormal   Collection Time: 09/17/15 11:15 PM  Result Value Ref Range   WBC 13.2 (H) 3.6 - 11.0 K/uL   RBC 4.99 3.80 - 5.20 MIL/uL   Hemoglobin 14.5 12.0 - 16.0 g/dL   HCT 44.5 35.0 - 47.0 %   MCV 89.2 80.0 - 100.0 fL   MCH 29.1 26.0 - 34.0 pg   MCHC 32.6 32.0 - 36.0 g/dL   RDW 13.6 11.5 - 14.5 %   Platelets 150 150 - 440 K/uL  Glucose, capillary     Status: Abnormal   Collection Time: 09/18/15  3:05 AM  Result Value Ref Range   Glucose-Capillary 470 (H) 65 - 99 mg/dL   Ct Abdomen Pelvis Wo Contrast  09/18/2015  CLINICAL DATA:  Abdominal pain with nausea, vomiting, and constipation for 6 days. Chronic kidney disease. EXAM: CT ABDOMEN AND PELVIS WITHOUT CONTRAST TECHNIQUE: Multidetector CT imaging of the abdomen and pelvis was performed following the standard protocol without IV contrast. COMPARISON:  CT 11/02/2014 FINDINGS: Lower chest:  The included lung bases are clear. Liver: Enlarged liver measuring at least 21 cm craniocaudal dimension with hepatic steatosis. No evidence focal lesion allowing for lack contrast. Hepatobiliary: Gallbladder physiologically distended. No calcified stone. No biliary dilatation. Pancreas: No ductal dilatation or inflammation, mildly atrophic. Spleen: Normal in size. Small perisplenic calcification, may reflect a small calcified lymph node. Splenule noted anteriorly. Adrenal glands: Left adrenal nodule measures 1.9 cm, mild adjacent induration at the parrot renal silhouettes is similar to prior exam. Right adrenal gland is normal. Kidneys: No hydronephrosis or obstructive uropathy. No nephrolithiasis. Left renal cysts including a parapelvic cyst, unchanged from prior exam. No perinephric stranding. Stomach/Bowel: Stomach physiologically distended. Small hiatal hernia. Distal esophageal thickening with mild adjacent soft tissue  stranding. There are no dilated or thickened small bowel loops. Small volume of stool throughout the colon without colonic wall thickening. The appendix is normal. Vascular/Lymphatic: No retroperitoneal adenopathy. Abdominal aorta is normal in caliber. Moderate atherosclerosis without aneurysm. Reproductive: Uterus and ovaries normal in size.  No adnexal mass. Bladder: Bladder physiologically distended. Other: No free air, free fluid, or intra-abdominal fluid collection. Musculoskeletal: There are no acute or suspicious osseous abnormalities. Degenerative change of both hips and throughout the spine. IMPRESSION: 1. Distal esophageal wall thickening with adjacent soft tissue stranding, suggestive of esophagitis. Small hiatal hernia. 2. No acute abnormality in the abdomen or pelvis. 3. Unchanged size  of left adrenal nodule measuring 1.9 cm. There is mild adjacent soft tissue stranding that is also unchanged, of doubtful clinical significance. Electronically Signed   By: Jeb Levering M.D.   On: 09/18/2015 02:11    Review of Systems  Constitutional: Negative for fever and chills.  HENT: Negative for sore throat and tinnitus.   Eyes: Negative for blurred vision and redness.  Respiratory: Negative for cough and shortness of breath.   Cardiovascular: Negative for chest pain, palpitations, orthopnea and PND.  Gastrointestinal: Positive for nausea, vomiting and constipation. Negative for abdominal pain and diarrhea.  Genitourinary: Negative for dysuria, urgency and frequency.  Musculoskeletal: Negative for myalgias and joint pain.  Skin: Negative for rash.       No lesions  Neurological: Negative for speech change, focal weakness and weakness.  Endo/Heme/Allergies: Does not bruise/bleed easily.       No temperature intolerance  Psychiatric/Behavioral: Negative for depression and suicidal ideas.    Blood pressure 132/76, pulse 95, temperature 99.1 F (37.3 C), temperature source Oral, resp. rate 18,  height 5' 9"  (1.753 m), weight 106.595 kg (235 lb), SpO2 97 %. Physical Exam  Nursing note and vitals reviewed. Constitutional: She is oriented to person, place, and time. She appears well-developed and well-nourished.  HENT:  Head: Normocephalic and atraumatic.  Mouth/Throat: Oropharynx is clear and moist.  Eyes: Conjunctivae and EOM are normal. Pupils are equal, round, and reactive to light. No scleral icterus.  Neck: Normal range of motion. Neck supple. No JVD present. No tracheal deviation present. No thyromegaly present.  Cardiovascular: Normal rate, regular rhythm and normal heart sounds.  Exam reveals no gallop and no friction rub.   No murmur heard. Respiratory: Effort normal and breath sounds normal.  GI: Soft. Bowel sounds are normal. She exhibits no distension. There is no tenderness.  Genitourinary:  Deferred  Musculoskeletal: Normal range of motion. She exhibits no edema.  Lymphadenopathy:    She has no cervical adenopathy.  Neurological: She is alert and oriented to person, place, and time. No cranial nerve deficit. She exhibits normal muscle tone.  Skin: Skin is warm and dry. No rash noted. No erythema.  Psychiatric: She has a normal mood and affect. Her behavior is normal. Judgment and thought content normal.     Assessment/Plan This is a 69 year old Caucasian female admitted for intractable nausea and vomiting with acute on chronic kidney injury and hyperglycemia. 1. Intractable nausea and vomiting: The patient has tried multiple doses of Zofran in the emergency department. I have added scopolamine patch and Compazine for symptomatic relief. Etiology most likely gastroparesis although decreased bowel transit due to constipation may also contribute to her symptoms. We will try to relieve her constipation as well as treat her nausea symptomatically. 2. Acute on chronic kidney injury: The patient's creatinine has marginally increased likely secondary to dehydration from  vomiting. Place the patient on intravenous fluid and encourage by mouth intake as tolerated. Avoid nephrotoxic drugs. 3. Hematemesis: Likely secondary to Mallory-Weiss tears although will get GI input. The patient is hemodynamically stable. 4. Diabetes mellitus type 2: Hyperglycemia without DKA. Hold oral hypoglycemic agents. Place patient on sliding scale insulin. Continue basal insulin coverage. 5. Essential hypertension: Continue lisinopril with hydrochlorothiazide 6. Coronary artery disease: Stable. Continue aspirin and Plavix 7. Depression: Continue Wellbutrin and Celexa 8. DVT prophylaxis: Heparin 9. GI prophylaxis: Pantoprazole per home regimen especially due to intractable vomiting The patient is a full code. Time spent on admission orders and patient care approximately 45 minutes  Harrie Foreman 09/18/2015, 7:06 AM

## 2015-09-18 NOTE — ED Notes (Signed)
Rectal exam performed by Dr York CeriseForbach with this RN present; pt tolerated well; hemoccult negative

## 2015-09-18 NOTE — ED Notes (Signed)
Pt finished first bottle of CT contrast; starting second bottle

## 2015-09-19 LAB — CBC
HEMATOCRIT: 37.4 % (ref 35.0–47.0)
HEMOGLOBIN: 12.2 g/dL (ref 12.0–16.0)
MCH: 29.5 pg (ref 26.0–34.0)
MCHC: 32.6 g/dL (ref 32.0–36.0)
MCV: 90.4 fL (ref 80.0–100.0)
Platelets: 124 10*3/uL — ABNORMAL LOW (ref 150–440)
RBC: 4.13 MIL/uL (ref 3.80–5.20)
RDW: 13.4 % (ref 11.5–14.5)
WBC: 8.1 10*3/uL (ref 3.6–11.0)

## 2015-09-19 LAB — GLUCOSE, CAPILLARY
Glucose-Capillary: 176 mg/dL — ABNORMAL HIGH (ref 65–99)
Glucose-Capillary: 133 mg/dL — ABNORMAL HIGH (ref 65–99)
Glucose-Capillary: 217 mg/dL — ABNORMAL HIGH (ref 65–99)
Glucose-Capillary: 155 mg/dL — ABNORMAL HIGH (ref 65–99)

## 2015-09-19 LAB — BASIC METABOLIC PANEL
Anion gap: 7 (ref 5–15)
BUN: 20 mg/dL (ref 6–20)
CHLORIDE: 107 mmol/L (ref 101–111)
CO2: 27 mmol/L (ref 22–32)
CREATININE: 1.17 mg/dL — AB (ref 0.44–1.00)
Calcium: 8.4 mg/dL — ABNORMAL LOW (ref 8.9–10.3)
GFR calc Af Amer: 54 mL/min — ABNORMAL LOW (ref >60)
GFR calc non Af Amer: 47 mL/min — ABNORMAL LOW (ref >60)
Glucose, Bld: 184 mg/dL — ABNORMAL HIGH (ref 65–99)
POTASSIUM: 3.7 mmol/L (ref 3.5–5.1)
SODIUM: 141 mmol/L (ref 135–145)

## 2015-09-19 MED ORDER — HYDROCHLOROTHIAZIDE 25 MG PO TABS
25.0000 mg | ORAL_TABLET | Freq: Every day | ORAL | Status: DC
  Administered 2015-09-19 – 2015-09-20 (×2): 25 mg via ORAL
  Filled 2015-09-19 (×2): qty 1

## 2015-09-19 MED ORDER — ATORVASTATIN CALCIUM 20 MG PO TABS
40.0000 mg | ORAL_TABLET | Freq: Every day | ORAL | Status: DC
  Administered 2015-09-19: 40 mg via ORAL
  Filled 2015-09-19: qty 2

## 2015-09-19 MED ORDER — LISINOPRIL-HYDROCHLOROTHIAZIDE 20-25 MG PO TABS: 1.0000 | ORAL_TABLET | Freq: Every day | ORAL | Status: DC

## 2015-09-19 MED ORDER — METOPROLOL SUCCINATE ER 25 MG PO TB24
25.0000 mg | ORAL_TABLET | Freq: Every day | ORAL | Status: DC
  Administered 2015-09-19 – 2015-09-20 (×2): 25 mg via ORAL
  Filled 2015-09-19 (×2): qty 1

## 2015-09-19 MED ORDER — ASPIRIN EC 81 MG PO TBEC
81.0000 mg | DELAYED_RELEASE_TABLET | Freq: Every day | ORAL | Status: DC
Start: 2015-09-19 — End: 2015-09-20
  Administered 2015-09-19: 81 mg via ORAL
  Filled 2015-09-19 (×2): qty 1

## 2015-09-19 MED ORDER — LISINOPRIL 20 MG PO TABS
20.0000 mg | ORAL_TABLET | Freq: Every day | ORAL | Status: DC
  Administered 2015-09-19 – 2015-09-20 (×2): 20 mg via ORAL
  Filled 2015-09-19 (×2): qty 1

## 2015-09-19 NOTE — Progress Notes (Signed)
[facility] - [place] at [facility]   PATIENT NAME: [name] Munoz    MR#:  [id number]  DATE OF BIRTH:  1946-02-28  SUBJECTIVE:  CHIEF COMPLAINT:   Chief Complaint  Patient presents with  . Abdominal Pain  . Nausea  . Emesis  . Constipation   Feeling much better today. No further nausea or vomiting  REVIEW OF SYSTEMS:   Review of Systems  Constitutional: Negative for fever.  Respiratory: Negative for shortness of breath.   Cardiovascular: Negative for chest pain and palpitations.  Gastrointestinal: Positive for nausea and vomiting. Negative for abdominal pain.  Genitourinary: Negative for dysuria.    DRUG ALLERGIES:  No Known Allergies  VITALS:  Blood pressure 185/81, pulse 90, temperature 98.5 F (36.9 C), temperature source Oral, resp. rate 17, height  (1.753 m), weight 103.284 kg (227 lb 11.2 oz), SpO2 99 %.  PHYSICAL EXAMINATION:  GENERAL:  69 y.o.-year-old patient lying in the bed with no acute distress.  LUNGS: Normal breath sounds bilaterally, no wheezing, rales,rhonchi or crepitation. No use of accessory muscles of respiration.  CARDIOVASCULAR: S1, S2 normal. No murmurs, rubs, or gallops.  ABDOMEN: Soft, nontender, nondistended. Bowel sounds present. No organomegaly or mass. No guarding no rebound EXTREMITIES: No pedal edema, cyanosis, or clubbing.  NEUROLOGIC: Cranial nerves II through XII are intact. Muscle strength 5/5 in all extremities. Sensation intact. Gait not checked.  PSYCHIATRIC: The patient is alert and oriented x 3.  SKIN: No obvious rash, lesion, or ulcer.    LABORATORY PANEL:   CBC  Recent Labs Lab 09/19/15 0405  WBC 8.1  HGB 12.2  HCT 37.4  PLT 124*   ------------------------------------------------------------------------------------------------------------------  Chemistries   Recent Labs Lab 09/17/15 2315 09/19/15 0405  NA 136 141  K 4.3 3.7  CL 97* 107  CO2 23 27  GLUCOSE 528* 184*  BUN 33*  20  CREATININE 1.89* 1.17*  CALCIUM 9.5 8.4*  AST 18  --   ALT 14  --   ALKPHOS 100  --   BILITOT 1.9*  --    ------------------------------------------------------------------------------------------------------------------  Cardiac Enzymes No results for input(s): TROPONINI in the last 168 hours. ------------------------------------------------------------------------------------------------------------------  RADIOLOGY:  Ct Abdomen Pelvis Wo Contrast  09/18/2015  CLINICAL DATA:  Abdominal pain with nausea, vomiting, and constipation for 6 days. Chronic kidney disease. EXAM: CT ABDOMEN AND PELVIS WITHOUT CONTRAST TECHNIQUE: Multidetector CT imaging of the abdomen and pelvis was performed following the standard protocol without IV contrast. COMPARISON:  CT 11/02/2014 FINDINGS: Lower chest:  The included lung bases are clear. Liver: Enlarged liver measuring at least 21 cm craniocaudal dimension with hepatic steatosis. No evidence focal lesion allowing for lack contrast. Hepatobiliary: Gallbladder physiologically distended. No calcified stone. No biliary dilatation. Pancreas: No ductal dilatation or inflammation, mildly atrophic. Spleen: Normal in size. Small perisplenic calcification, may reflect a small calcified lymph node. Splenule noted anteriorly. Adrenal glands: Left adrenal nodule measures 1.9 cm, mild adjacent induration at the parrot renal silhouettes is similar to prior exam. Right adrenal gland is normal. Kidneys: No hydronephrosis or obstructive uropathy. No nephrolithiasis. Left renal cysts including a parapelvic cyst, unchanged from prior exam. No perinephric stranding. Stomach/Bowel: Stomach physiologically distended. Small hiatal hernia. Distal esophageal thickening with mild adjacent soft tissue stranding. There are no dilated or thickened small bowel loops. Small volume of stool throughout the colon without colonic wall thickening. The appendix is normal. Vascular/Lymphatic: No  retroperitoneal adenopathy. Abdominal aorta is normal in caliber. Moderate atherosclerosis without aneurysm. Reproductive: Uterus  and ovaries normal in size.  No adnexal mass. Bladder: Bladder physiologically distended. Other: No free air, free fluid, or intra-abdominal fluid collection. Musculoskeletal: There are no acute or suspicious osseous abnormalities. Degenerative change of both hips and throughout the spine. IMPRESSION: 1. Distal esophageal wall thickening with adjacent soft tissue stranding, suggestive of esophagitis. Small hiatal hernia. 2. No acute abnormality in the abdomen or pelvis. 3. Unchanged size of left adrenal nodule measuring 1.9 cm. There is mild adjacent soft tissue stranding that is also unchanged, of doubtful clinical significance. Electronically Signed   By: Rubye OaksMelanie  Ehinger M.D.   On: 09/18/2015 02:11    EKG:   Orders placed or performed in visit on 05/30/13  . EKG 12-Lead    ASSESSMENT AND PLAN:   1. Intractable nausea and vomiting/Mallory-Weiss tear:  - Likely with gastroparesis in the setting of uncontrolled diabetes. I have ordered when necessary Reglan - Appreciate gastroenterology consultation. Plan for EGD on Monday for possible Mallory-Weiss tear - CT abdomen does show esophageal thickening, continue PPI  2. Acute on chronic kidney injury: Improving - Continue hydration.  4. Diabetes mellitus type 2:  - A1c 10.9. Poor control. Continue sliding scale and insulin while here. Blood sugars are now very well controlled.   5. Essential hypertension: Continue lisinopril with hydrochlorothiazide and metoprolol  6. Coronary artery disease: Stable. Continue aspirin. Hold Plavix in the setting of bleeding and in preparation for EGD tomorrow  7. Depression: Continue Wellbutrin and Celexa restarted after EGD  8. DVT prophylaxis: SCDs in the setting of hematemesis  9. GI prophylaxis: Pantoprazole per home regimen especially due to intractable vomiting    All  the records are reviewed and case discussed with Care Management/Social Workerr. Management plans discussed with the patient, family and they are in agreement. I [facility] also [facility]  CODE STATUS: Full  TOTAL TIME TAKING CARE OF THIS PATIENT: 20 minutes.  Greater than 50% of time spent in care coordination and counseling. POSSIBLE D/C IN 2 DAYS, DEPENDING ON CLINICAL CONDITION.   Elby ShowersWALSH, CATHERINE M.D on 09/19/2015 at 2:18 PM  Between 7am to 6pm - Pager - [phone]  After 6pm go to [email or web address] - password [email or web address]  [address] [facility]  Office  [phone]  CC: Primary care physician; Pcp Not In System

## 2015-09-19 NOTE — Consult Note (Signed)
Pt doing well, no vomiting, no bleeding, VSS afebrile.  Hgb 12.2, plt ct 124, WBC 8.1  VSS afebrile, chest clear, heart RRR.  Will start clear liquid diet and do EGD tomorrow morning.  Pt aware and agrees.

## 2015-09-20 ENCOUNTER — Encounter
Admission: EM | Disposition: A | Discharge status: Home / Self Care | Payer: Self-pay | Source: Home / Self Care | Attending: Emergency Medicine

## 2015-09-20 ENCOUNTER — Observation Stay: Payer: Medicare (Managed Care) | Admitting: Anesthesiology

## 2015-09-20 ENCOUNTER — Encounter: Payer: Self-pay | Admitting: *Deleted

## 2015-09-20 HISTORY — PX: ESOPHAGOGASTRODUODENOSCOPY: SHX5428

## 2015-09-20 LAB — BASIC METABOLIC PANEL
ANION GAP: 3 — AB (ref 5–15)
BUN: 9 mg/dL (ref 6–20)
CALCIUM: 8.4 mg/dL — AB (ref 8.9–10.3)
CHLORIDE: 110 mmol/L (ref 101–111)
CO2: 28 mmol/L (ref 22–32)
CREATININE: 1 mg/dL (ref 0.44–1.00)
GFR calc non Af Amer: 57 mL/min — ABNORMAL LOW (ref >60)
GLUCOSE: 120 mg/dL — AB (ref 65–99)
Potassium: 3.3 mmol/L — ABNORMAL LOW (ref 3.5–5.1)
Sodium: 141 mmol/L (ref 135–145)

## 2015-09-20 LAB — CBC
HEMATOCRIT: 37.2 % (ref 35.0–47.0)
HEMOGLOBIN: 12.4 g/dL (ref 12.0–16.0)
MCH: 30 pg (ref 26.0–34.0)
MCHC: 33.2 g/dL (ref 32.0–36.0)
MCV: 90.3 fL (ref 80.0–100.0)
Platelets: 122 10*3/uL — ABNORMAL LOW (ref 150–440)
RBC: 4.12 MIL/uL (ref 3.80–5.20)
RDW: 13.5 % (ref 11.5–14.5)
WBC: 5.9 10*3/uL (ref 3.6–11.0)

## 2015-09-20 LAB — GLUCOSE, CAPILLARY
Glucose-Capillary: 121 mg/dL — ABNORMAL HIGH (ref 65–99)
Glucose-Capillary: 174 mg/dL — ABNORMAL HIGH (ref 65–99)

## 2015-09-20 SURGERY — EGD (ESOPHAGOGASTRODUODENOSCOPY)
Anesthesia: General

## 2015-09-20 MED ORDER — BUPROPION HCL ER (XL) 150 MG PO TB24
150.0000 mg | ORAL_TABLET | Freq: Every day | ORAL | Status: DC
  Administered 2015-09-20: 150 mg via ORAL
  Filled 2015-09-20: qty 1

## 2015-09-20 MED ORDER — CITALOPRAM HYDROBROMIDE 40 MG PO TABS
40.0000 mg | ORAL_TABLET | Freq: Every day | ORAL | Status: DC
  Administered 2015-09-20: 40 mg via ORAL
  Filled 2015-09-20: qty 1

## 2015-09-20 MED ORDER — LACTATED RINGERS IV SOLN
INTRAVENOUS | Status: DC | PRN
  Administered 2015-09-20: 08:00:00 via INTRAVENOUS

## 2015-09-20 MED ORDER — ERGOCALCIFEROL 1.25 MG (50000 UT) PO CAPS
50000.0000 [IU] | ORAL_CAPSULE | ORAL | Status: DC
  Administered 2015-09-20: 50000 [IU] via ORAL
  Filled 2015-09-20: qty 1

## 2015-09-20 MED ORDER — IPRATROPIUM-ALBUTEROL 0.5-2.5 (3) MG/3ML IN SOLN
RESPIRATORY_TRACT | Status: AC
  Filled 2015-09-20: qty 3

## 2015-09-20 MED ORDER — PIOGLITAZONE HCL 15 MG PO TABS
15.0000 mg | ORAL_TABLET | Freq: Every day | ORAL | Status: DC
  Administered 2015-09-20: 15 mg via ORAL
  Filled 2015-09-20: qty 1

## 2015-09-20 MED ORDER — SODIUM CHLORIDE 0.9 % IV SOLN: INTRAVENOUS | Status: DC

## 2015-09-20 MED ORDER — PREGABALIN 75 MG PO CAPS
75.0000 mg | ORAL_CAPSULE | Freq: Three times a day (TID) | ORAL | Status: DC
  Administered 2015-09-20: 75 mg via ORAL
  Filled 2015-09-20: qty 1

## 2015-09-20 MED ORDER — PROPOFOL 10 MG/ML IV BOLUS
INTRAVENOUS | Status: DC | PRN
Start: 2015-09-20 — End: 2015-09-20
  Administered 2015-09-20: 50 mg via INTRAVENOUS
  Administered 2015-09-20: 20 mg via INTRAVENOUS

## 2015-09-20 MED ORDER — CLOPIDOGREL BISULFATE 75 MG PO TABS
75.0000 mg | ORAL_TABLET | Freq: Every day | ORAL | Status: DC
  Administered 2015-09-20: 75 mg via ORAL
  Filled 2015-09-20: qty 1

## 2015-09-20 MED ORDER — POLYETHYLENE GLYCOL 3350 17 G PO PACK: 17.0000 g | PACK | Freq: Every day | ORAL | Status: DC | PRN

## 2015-09-20 MED ORDER — IPRATROPIUM-ALBUTEROL 0.5-2.5 (3) MG/3ML IN SOLN
3.0000 mL | Freq: Four times a day (QID) | RESPIRATORY_TRACT | Status: DC
  Administered 2015-09-20: 3 mL via RESPIRATORY_TRACT

## 2015-09-20 MED ORDER — INSULIN GLARGINE 100 UNIT/ML ~~LOC~~ SOLN
50.0000 [IU] | Freq: Every day | SUBCUTANEOUS | Status: DC
  Administered 2015-09-20: 50 [IU] via SUBCUTANEOUS
  Filled 2015-09-20 (×2): qty 0.5

## 2015-09-20 NOTE — Op Note (Signed)
[facility] Gastroenterology Patient Name: [name] NeatSusan Munoz Procedure Date: 09/20/2015 7:40 AM MRN: [id number] Account #: [id number] Date of Birth: 05-20-1946 Admit Type: Inpatient Age: [age] [facility] 1 Gender: Female Note Status: Finalized Procedure:         Upper GI endoscopy Indications:       Hematemesis Providers:         Scot Junobert T. Whittney Steenson, MD Referring MD:      No Local Md, MD (Referring MD) Medicines:         Propofol per Anesthesia Complications:     No immediate complications. Procedure:         Pre-Anesthesia Assessment:                    - After reviewing the risks and benefits, the patient was                     deemed in satisfactory condition to undergo the procedure.                    After obtaining informed consent, the endoscope was passed                     under direct vision. Throughout the procedure, the                     patient's blood pressure, pulse, and oxygen saturations                     were monitored continuously. The Endoscope was introduced                     through the mouth, and advanced to the duodenal bulb. The                     upper GI endoscopy was accomplished without difficulty.                     The patient tolerated the procedure. Findings:      LA Grade C (one or more mucosal breaks continuous between tops of 2 or       more mucosal folds, less than 75% circumference) esophagitis with no       bleeding was found. No active bleeding, no clots no ulcer. Spots of old       blood adherent to mucosa in distal esophagus.      The entire examined stomach was normal.      fluid in stomach was suctioned. After exam Patient dropped her       saturation and was given oxygen via bag mask and her saturaion quickly       came back up. Impression:        - LA Grade C reflux esophagitis.                    - Normal stomach.                    - No specimens collected. Recommendation:    - The findings and  recommendations were discussed with the                     patient's family. Scot Junobert T Dacari Beckstrand, MD 09/20/2015 8:08:14 AM This report has been signed electronically. Number of Addenda: 0 Note Initiated On: 09/20/2015 7:40  [facility]

## 2015-09-20 NOTE — Care Management (Signed)
[facility] will [facility] to [facility] this afternoon who will provide transportation. No other CM needs identified.

## 2015-09-20 NOTE — Progress Notes (Signed)
IV was removed. Discharge instructions, follow-up appointments, and prescriptions were provided to the pt. The pt is currently awaiting pickup by [facility] driver.

## 2015-09-20 NOTE — Transfer of Care (Signed)
Immediate Anesthesia Transfer of Care Note  Patient: [name] Munoz  Procedure(s) Performed: Procedure(s): ESOPHAGOGASTRODUODENOSCOPY (EGD) (N/A)  Patient Location: PACU and Endoscopy Unit  Anesthesia Type:General  Level of Consciousness: awake, alert  and oriented  Airway & Oxygen Therapy: Patient Spontanous Breathing and Patient connected to nasal cannula oxygen  Post-op Assessment: Report given to RN and Post -op Vital signs reviewed and stable  Post vital signs: Reviewed and stable  Last Vitals:  Filed Vitals:   09/19/15 2032 09/20/15 0607  BP: 163/70 139/60  Pulse: 75 74  Temp: 37.2 C 36.9 C  Resp: 20 16    Complications: No apparent anesthesia complications

## 2015-09-20 NOTE — Anesthesia Preprocedure Evaluation (Signed)
Anesthesia Evaluation  Patient identified by MRN, date of birth, ID band Patient awake    Reviewed: Allergy & Precautions, NPO status , Patient's Chart, lab work & pertinent test results, reviewed documented beta blocker date and time   Airway Mallampati: III  TM Distance: <3 FB Neck ROM: Full    Dental  (+) Upper Dentures   Pulmonary neg pulmonary ROS,    Pulmonary exam normal        Cardiovascular hypertension, Pt. on medications and Pt. on home beta blockers + CAD  Normal cardiovascular exam     Neuro/Psych negative neurological ROS  negative psych ROS   GI/Hepatic negative GI ROS, Neg liver ROS, GERD  Medicated and Controlled,  Endo/Other  diabetes, Well Controlled, Type 2  Renal/GU   negative genitourinary   Musculoskeletal negative musculoskeletal ROS (+)   Abdominal   Peds negative pediatric ROS (+)  Hematology negative hematology ROS (+)   Anesthesia Other Findings   Reproductive/Obstetrics                             Anesthesia Physical Anesthesia Plan  ASA: III  Anesthesia Plan: General   Post-op Pain Management:    Induction: Intravenous  Airway Management Planned: Nasal Cannula  Additional Equipment:   Intra-op Plan:   Post-operative Plan:   Informed Consent: I have reviewed the patients History and Physical, chart, labs and discussed the procedure including the risks, benefits and alternatives for the proposed anesthesia with the patient or authorized representative who has indicated his/her understanding and acceptance.   Dental advisory given  Plan Discussed with: CRNA and Surgeon  Anesthesia Plan Comments:         Anesthesia Quick Evaluation

## 2015-09-20 NOTE — OR Nursing (Signed)
Alert Dr Mechele CollinElliott in . No further coughing. Drinking water

## 2015-09-21 ENCOUNTER — Encounter: Payer: Self-pay | Admitting: Unknown Physician Specialty

## 2015-09-24 NOTE — Anesthesia Postprocedure Evaluation (Signed)
Anesthesia Post Note  Patient: [name] NeatSusan Munoz  Procedure(s) Performed: Procedure(s) (LRB): ESOPHAGOGASTRODUODENOSCOPY (EGD) (N/A)  Patient location during evaluation: Endoscopy Anesthesia Type: General Level of consciousness: awake, awake and alert and oriented Pain management: pain level controlled Vital Signs Assessment: post-procedure vital signs reviewed and stable Respiratory status: spontaneous breathing Cardiovascular status: blood pressure returned to baseline Anesthetic complications: no    Last Vitals:  Filed Vitals:   09/20/15 0845 09/20/15 0914  BP: 135/56 157/72  Pulse: 98 93  Temp:  36.4 C  Resp: 18     Last Pain:  Filed Vitals:   09/20/15 1010  PainSc: 0-No pain                 Zackaria Burkey

## 2015-09-27 NOTE — Discharge Summary (Signed)
[facility] [place] [facility]  DISCHARGE SUMMARY   PATIENT NAME: [name] NeatSusan Munoz    MR#:  [id number]  DATE OF BIRTH:  Aug 18, 1946  DATE OF ADMISSION:  09/17/2015 ADMITTING PHYSICIAN: Arnaldo NatalMichael S Diamond, MD  DATE OF DISCHARGE: 09/20/2015  PRIMARY CARE PHYSICIAN: Pcp Not In System    ADMISSION DIAGNOSIS:  Esophagitis [K20.9] Nausea and vomiting, vomiting of unspecified type [R11.2]  DISCHARGE DIAGNOSIS:  Active Problems:   Intractable vomiting   SECONDARY DIAGNOSIS:   Past Medical History  Diagnosis Date  . Diabetes mellitus without complication (HCC)   . Hypertension   . Coronary artery disease     HOSPITAL COURSE:    1. Intractable nausea and vomiting/Mallory-Weiss tear:  - Likely with gastroparesis in the setting of uncontrolled diabetes. I have ordered when necessary Reglan during hospitalization, but she was not needing it by the time of discharge. - She had EGD on 11/21 showing esophagitis without bleeding.  Will need to continue on PPI.  2. Acute on chronic kidney injury: Due to dehydration in the seeing of #1. Improved with hydration.  4. Diabetes mellitus type 2:  - A1c 10.9. Poor control. Continue sliding scale and insulin while here. Blood sugars are now very well controlled. Will need to discuss with PCP on discharge.  5. Essential hypertension: Continue lisinopril with hydrochlorothiazide and metoprolol  6. Coronary artery disease: Stable. Continue aspirin and Plavix  7. Depression: Continue Wellbutrin and Celexa   DISCHARGE CONDITIONS:   stable  CONSULTS OBTAINED:  Treatment Team:  Scot Junobert T Elliott, MD  DRUG ALLERGIES:  No Known Allergies  DISCHARGE MEDICATIONS:   Discharge Medication List as of 09/20/2015 11:40 AM    START taking these medications   Details  ondansetron (ZOFRAN) 4 MG tablet Take 1-2 tabs by mouth every 8 hours as needed for nausea/vomiting, Print    pantoprazole (PROTONIX) 40 MG tablet  Take 1 tablet (40 mg total) by mouth 2 (two) times daily., Starting 09/18/2015, Until Sun 09/17/16, Print    sucralfate (CARAFATE) 1 GM/10ML suspension Take 10 mLs (1 g total) by mouth 4 (four) times daily., Starting 09/18/2015, Until Sun 09/17/16, Print      CONTINUE these medications which have NOT CHANGED   Details  aspirin EC 81 MG tablet Take 81 mg by mouth daily., Until Discontinued, Historical Med    atorvastatin (LIPITOR) 40 MG tablet Take 40 mg by mouth daily., Until Discontinued, Historical Med    buPROPion (WELLBUTRIN XL) 150 MG 24 hr tablet Take 150 mg by mouth daily., Until Discontinued, Historical Med    citalopram (CELEXA) 40 MG tablet Take 40 mg by mouth daily., Until Discontinued, Historical Med    clopidogrel (PLAVIX) 75 MG tablet Take 75 mg by mouth daily., Until Discontinued, Historical Med    ergocalciferol (VITAMIN D2) 50000 UNITS capsule Take 50,000 Units by mouth every 14 (fourteen) days. On the first and third Monday of each month, Until Discontinued, Historical Med    insulin glargine (LANTUS) 100 UNIT/ML injection Inject 50 Units into the skin daily., Until Discontinued, Historical Med    lisinopril-hydrochlorothiazide (PRINZIDE,ZESTORETIC) 20-25 MG tablet Take 1 tablet by mouth daily., Until Discontinued, Historical Med    metoprolol succinate (TOPROL-XL) 25 MG 24 hr tablet Take 25 mg by mouth daily., Until Discontinued, Historical Med    pioglitazone (ACTOS) 15 MG tablet Take 15 mg by mouth daily., Until Discontinued, Historical Med    polyethylene glycol (MIRALAX / GLYCOLAX) packet Take 17 g by mouth daily  as needed for mild constipation., Until Discontinued, Historical Med    pregabalin (LYRICA) 75 MG capsule Take 75 mg by mouth 3 (three) times daily., Until Discontinued, Historical Med    ranitidine (ZANTAC) 150 MG tablet Take 150 mg by mouth 2 (two) times daily., Until Discontinued, Historical Med         DISCHARGE INSTRUCTIONS:    See  AVS  If you experience worsening of your admission symptoms, develop shortness of breath, life threatening emergency, suicidal or homicidal thoughts you must seek medical attention immediately by calling 911 or calling your MD immediately  if symptoms less severe.  You Must read complete instructions/literature along with all the possible adverse reactions/side effects for all the Medicines you take and that have been prescribed to you. Take any new Medicines after you have completely understood and accept all the possible adverse reactions/side effects.   Please note  You were cared for by a hospitalist during your hospital stay. If you have any questions about your discharge medications or the care you received while you were in the hospital after you are discharged, you can call the unit and asked to speak with the hospitalist on call if the hospitalist that took care of you is not available. Once you are discharged, your primary care physician will handle any further medical issues. Please note that NO REFILLS for any discharge medications will be authorized once you are discharged, as it is imperative that you return to your primary care physician (or establish a relationship with a primary care physician if you do not have one) for your aftercare needs so that they can reassess your need for medications and monitor your lab values.    Today   CHIEF COMPLAINT:   Chief Complaint  Patient presents with  . Abdominal Pain  . Nausea  . Emesis  . Constipation    HISTORY OF PRESENT ILLNESS:  The patient presents emergency department complaining of constipation and abdominal cramping. She states this is been a progressively worsening problem for the last week. She chronically has occasional bouts of constipation. She had seen her primary care physician with these complaints a few days ago and was prescribed some laxatives. She's been unable to keep laxatives down and having nonbilious vomiting.  Today however she has seen some blood-tinged emesis and nursing staff reports coffee-ground emesis. The patient received multiple doses of antiemetics in the emergency department without relief. Laboratory evaluation reveals acute on chronic kidney injury as well as hyperglycemia found to the emergency department staff to call for admission.  VITAL SIGNS:  Blood pressure 157/72, pulse 93, temperature 97.5 F (36.4 C), temperature source Oral, resp. rate 18, height  (1.753 m), weight 103.647 kg (228 lb 8 oz), SpO2 97 %.  I/O:  No intake or output data in the 24 hours ending 09/27/15 1003  PHYSICAL EXAMINATION:  GENERAL:  69 y.o.-year-old patient lying in the bed with no acute distress.  EYES: Pupils equal, round, reactive to light and accommodation. No scleral icterus. Extraocular muscles intact.  HEENT: Head atraumatic, normocephalic. Oropharynx and nasopharynx clear.  NECK:  Supple, no jugular venous distention. No thyroid enlargement, no tenderness.  LUNGS: Normal breath sounds bilaterally, no wheezing, rales,rhonchi or crepitation. No use of accessory muscles of respiration.  CARDIOVASCULAR: S1, S2 normal. No murmurs, rubs, or gallops.  ABDOMEN: Soft, non-tender, non-distended. Bowel sounds present. No organomegaly or mass.  EXTREMITIES: No pedal edema, cyanosis, or clubbing.  NEUROLOGIC: Cranial nerves II through XII are intact.  Muscle strength 5/5 in all extremities. Sensation intact. Gait not checked.  PSYCHIATRIC: The patient is alert and oriented x 3.  SKIN: No obvious rash, lesion, or ulcer.   DATA REVIEW:   CBC No results for input(s): WBC, HGB, HCT, PLT in the last 168 hours.  Chemistries  No results for input(s): NA, K, CL, CO2, GLUCOSE, BUN, CREATININE, CALCIUM, MG, AST, ALT, ALKPHOS, BILITOT in the last 168 hours.  Invalid input(s): GFRCGP  Cardiac Enzymes No results for input(s): TROPONINI in the last 168 hours.  Microbiology Results  Results for orders placed  or performed in visit on 05/30/13  Urine culture     Status: None   Collection Time: 05/30/13  8:26 PM  Result Value Ref Range Status   Micro Text Report   Final       SOURCE: CLEAN CATCH    COMMENT                   MIXED BACTERIAL ORGANISMS   COMMENT                   RESULTS SUGGESTIVE OF CONTAMINATION   COMMENT                   SUGGEST RECOLLECTION USING STERILE TECHNIQUE   ANTIBIOTIC                                                      Urine culture     Status: None   Collection Time: 05/31/13 12:24 PM  Result Value Ref Range Status   Micro Text Report   Final       SOURCE: CLEAN CATCH    COMMENT                   MIXED BACTERIAL ORGANISMS   COMMENT                   RESULTS SUGGESTIVE OF CONTAMINATION   ANTIBIOTIC                                                        RADIOLOGY:  No results found.  EKG:   Orders placed or performed in visit on 05/30/13  . EKG 12-Lead      Management plans discussed with the patient, family and they are in agreement.  CODE STATUS: full  TOTAL TIME TAKING CARE OF THIS PATIENT: 36 minutes.  Greater than 50% of time spent in care coordination and counseling.  Elby Showers M.D on 09/27/2015 at 10:03 AM  Between 7am to 6pm - Pager - [phone]  After 6pm go to [email or web address] - password [email or web address]  [facility]  Office  [phone]  CC: Primary care physician; Pcp Not In System

## 2015-10-08 ENCOUNTER — Other Ambulatory Visit: Payer: Self-pay | Admitting: Family

## 2015-10-19 ENCOUNTER — Inpatient Hospital Stay
Admission: EM | Admit: 2015-10-19 | Discharge: 2015-10-21 | DRG: 683 | Disposition: A | Discharge status: Home / Self Care | Payer: Medicare (Managed Care) | Attending: Internal Medicine | Admitting: Internal Medicine

## 2015-10-19 ENCOUNTER — Encounter: Payer: Self-pay | Admitting: Emergency Medicine

## 2015-10-19 DIAGNOSIS — I10 Essential (primary) hypertension: Secondary | ICD-10-CM | POA: Diagnosis present

## 2015-10-19 DIAGNOSIS — E785 Hyperlipidemia, unspecified: Secondary | ICD-10-CM | POA: Diagnosis present

## 2015-10-19 DIAGNOSIS — N170 Acute kidney failure with tubular necrosis: Principal | ICD-10-CM | POA: Diagnosis present

## 2015-10-19 DIAGNOSIS — I959 Hypotension, unspecified: Secondary | ICD-10-CM | POA: Diagnosis present

## 2015-10-19 DIAGNOSIS — Z794 Long term (current) use of insulin: Secondary | ICD-10-CM | POA: Diagnosis not present

## 2015-10-19 DIAGNOSIS — K219 Gastro-esophageal reflux disease without esophagitis: Secondary | ICD-10-CM | POA: Diagnosis present

## 2015-10-19 DIAGNOSIS — E871 Hypo-osmolality and hyponatremia: Secondary | ICD-10-CM | POA: Diagnosis present

## 2015-10-19 DIAGNOSIS — R739 Hyperglycemia, unspecified: Secondary | ICD-10-CM

## 2015-10-19 DIAGNOSIS — W19XXXA Unspecified fall, initial encounter: Secondary | ICD-10-CM | POA: Diagnosis present

## 2015-10-19 DIAGNOSIS — Z7982 Long term (current) use of aspirin: Secondary | ICD-10-CM | POA: Diagnosis not present

## 2015-10-19 DIAGNOSIS — E860 Dehydration: Secondary | ICD-10-CM | POA: Diagnosis present

## 2015-10-19 DIAGNOSIS — R32 Unspecified urinary incontinence: Secondary | ICD-10-CM | POA: Diagnosis present

## 2015-10-19 DIAGNOSIS — N179 Acute kidney failure, unspecified: Secondary | ICD-10-CM | POA: Diagnosis present

## 2015-10-19 DIAGNOSIS — F329 Major depressive disorder, single episode, unspecified: Secondary | ICD-10-CM | POA: Diagnosis present

## 2015-10-19 DIAGNOSIS — I2510 Atherosclerotic heart disease of native coronary artery without angina pectoris: Secondary | ICD-10-CM | POA: Diagnosis present

## 2015-10-19 DIAGNOSIS — N390 Urinary tract infection, site not specified: Secondary | ICD-10-CM

## 2015-10-19 DIAGNOSIS — E1142 Type 2 diabetes mellitus with diabetic polyneuropathy: Secondary | ICD-10-CM | POA: Diagnosis present

## 2015-10-19 DIAGNOSIS — Z79899 Other long term (current) drug therapy: Secondary | ICD-10-CM | POA: Diagnosis not present

## 2015-10-19 DIAGNOSIS — E1165 Type 2 diabetes mellitus with hyperglycemia: Secondary | ICD-10-CM | POA: Diagnosis present

## 2015-10-19 DIAGNOSIS — Z7902 Long term (current) use of antithrombotics/antiplatelets: Secondary | ICD-10-CM | POA: Diagnosis not present

## 2015-10-19 HISTORY — DX: Major depressive disorder, single episode, unspecified: F32.9

## 2015-10-19 HISTORY — DX: Gastro-esophageal reflux disease without esophagitis: K21.9

## 2015-10-19 HISTORY — DX: Depression, unspecified: F32.A

## 2015-10-19 LAB — CBC WITH DIFFERENTIAL/PLATELET
BASOS ABS: 0.1 10*3/uL (ref 0–0.1)
BASOS PCT: 1 %
Eosinophils Absolute: 0.1 10*3/uL (ref 0–0.7)
Eosinophils Relative: 1 %
HEMATOCRIT: 40.2 % (ref 35.0–47.0)
HEMOGLOBIN: 12.9 g/dL (ref 12.0–16.0)
Lymphocytes Relative: 13 %
Lymphs Abs: 2 10*3/uL (ref 1.0–3.6)
MCH: 28.6 pg (ref 26.0–34.0)
MCHC: 32.2 g/dL (ref 32.0–36.0)
MCV: 88.7 fL (ref 80.0–100.0)
MONOS PCT: 7 %
Monocytes Absolute: 1 10*3/uL — ABNORMAL HIGH (ref 0.2–0.9)
NEUTROS ABS: 11.5 10*3/uL — AB (ref 1.4–6.5)
NEUTROS PCT: 78 %
Platelets: 200 10*3/uL (ref 150–440)
RBC: 4.53 MIL/uL (ref 3.80–5.20)
RDW: 12.9 % (ref 11.5–14.5)
WBC: 14.6 10*3/uL — AB (ref 3.6–11.0)

## 2015-10-19 LAB — BASIC METABOLIC PANEL
ANION GAP: 13 (ref 5–15)
BUN: 61 mg/dL — ABNORMAL HIGH (ref 6–20)
CHLORIDE: 93 mmol/L — AB (ref 101–111)
CO2: 23 mmol/L (ref 22–32)
Calcium: 8.9 mg/dL (ref 8.9–10.3)
Creatinine, Ser: 4.36 mg/dL — ABNORMAL HIGH (ref 0.44–1.00)
GFR calc non Af Amer: 10 mL/min — ABNORMAL LOW (ref >60)
GFR, EST AFRICAN AMERICAN: 11 mL/min — AB (ref >60)
Glucose, Bld: 382 mg/dL — ABNORMAL HIGH (ref 65–99)
POTASSIUM: 4.6 mmol/L (ref 3.5–5.1)
SODIUM: 129 mmol/L — AB (ref 135–145)

## 2015-10-19 LAB — TSH: TSH: 1.613 u[IU]/mL (ref 0.350–4.500)

## 2015-10-19 LAB — URINALYSIS COMPLETE WITH MICROSCOPIC (ARMC ONLY)
BILIRUBIN URINE: NEGATIVE
GLUCOSE, UA: 50 mg/dL — AB
Ketones, ur: NEGATIVE mg/dL
NITRITE: NEGATIVE
Protein, ur: NEGATIVE mg/dL
SPECIFIC GRAVITY, URINE: 1.009 (ref 1.005–1.030)
pH: 5 (ref 5.0–8.0)

## 2015-10-19 LAB — GLUCOSE, CAPILLARY
GLUCOSE-CAPILLARY: 339 mg/dL — AB (ref 65–99)
GLUCOSE-CAPILLARY: 339 mg/dL — AB (ref 65–99)
GLUCOSE-CAPILLARY: 394 mg/dL — AB (ref 65–99)

## 2015-10-19 MED ORDER — ASPIRIN EC 81 MG PO TBEC
81.0000 mg | DELAYED_RELEASE_TABLET | Freq: Every day | ORAL | Status: DC
  Administered 2015-10-20 – 2015-10-21 (×2): 81 mg via ORAL
  Filled 2015-10-19 (×2): qty 1

## 2015-10-19 MED ORDER — ERGOCALCIFEROL 1.25 MG (50000 UT) PO CAPS
50000.0000 [IU] | ORAL_CAPSULE | ORAL | Status: DC
  Filled 2015-10-19: qty 1

## 2015-10-19 MED ORDER — ACETAMINOPHEN 650 MG RE SUPP: 650.0000 mg | Freq: Four times a day (QID) | RECTAL | Status: DC | PRN

## 2015-10-19 MED ORDER — INSULIN GLARGINE 100 UNIT/ML ~~LOC~~ SOLN
50.0000 [IU] | Freq: Every day | SUBCUTANEOUS | Status: DC
  Administered 2015-10-20 – 2015-10-21 (×2): 50 [IU] via SUBCUTANEOUS
  Filled 2015-10-19 (×3): qty 0.5

## 2015-10-19 MED ORDER — PANTOPRAZOLE SODIUM 40 MG PO TBEC
40.0000 mg | DELAYED_RELEASE_TABLET | Freq: Two times a day (BID) | ORAL | Status: DC
  Administered 2015-10-19 – 2015-10-21 (×4): 40 mg via ORAL
  Filled 2015-10-19 (×4): qty 1

## 2015-10-19 MED ORDER — CLOPIDOGREL BISULFATE 75 MG PO TABS
75.0000 mg | ORAL_TABLET | Freq: Every day | ORAL | Status: DC
  Administered 2015-10-20 – 2015-10-21 (×2): 75 mg via ORAL
  Filled 2015-10-19 (×2): qty 1

## 2015-10-19 MED ORDER — BUPROPION HCL ER (XL) 150 MG PO TB24
150.0000 mg | ORAL_TABLET | Freq: Every day | ORAL | Status: DC
  Administered 2015-10-20 – 2015-10-21 (×2): 150 mg via ORAL
  Filled 2015-10-19 (×3): qty 1

## 2015-10-19 MED ORDER — PIOGLITAZONE HCL 15 MG PO TABS
15.0000 mg | ORAL_TABLET | Freq: Every day | ORAL | Status: DC
  Administered 2015-10-20 – 2015-10-21 (×2): 15 mg via ORAL
  Filled 2015-10-19 (×3): qty 1

## 2015-10-19 MED ORDER — SODIUM CHLORIDE 0.9 % IV BOLUS (SEPSIS)
1000.0000 mL | Freq: Once | INTRAVENOUS | Status: AC
  Administered 2015-10-19: 1000 mL via INTRAVENOUS

## 2015-10-19 MED ORDER — HYDROCODONE-ACETAMINOPHEN 5-325 MG PO TABS: 1.0000 | ORAL_TABLET | ORAL | Status: DC | PRN

## 2015-10-19 MED ORDER — FAMOTIDINE 20 MG PO TABS
20.0000 mg | ORAL_TABLET | Freq: Two times a day (BID) | ORAL | Status: DC
  Administered 2015-10-20: 09:00:00 20 mg via ORAL
  Filled 2015-10-19 (×2): qty 1

## 2015-10-19 MED ORDER — SUCRALFATE 1 GM/10ML PO SUSP
1.0000 g | Freq: Four times a day (QID) | ORAL | Status: DC
  Administered 2015-10-19 – 2015-10-21 (×6): 1 g via ORAL
  Filled 2015-10-19 (×6): qty 10

## 2015-10-19 MED ORDER — CITALOPRAM HYDROBROMIDE 20 MG PO TABS
40.0000 mg | ORAL_TABLET | Freq: Every day | ORAL | Status: DC
  Administered 2015-10-20 – 2015-10-21 (×2): 40 mg via ORAL
  Filled 2015-10-19 (×2): qty 2

## 2015-10-19 MED ORDER — ATORVASTATIN CALCIUM 20 MG PO TABS
40.0000 mg | ORAL_TABLET | Freq: Every day | ORAL | Status: DC
  Administered 2015-10-20 – 2015-10-21 (×2): 40 mg via ORAL
  Filled 2015-10-19 (×2): qty 2

## 2015-10-19 MED ORDER — SODIUM CHLORIDE 0.9 % IV SOLN
INTRAVENOUS | Status: DC
  Administered 2015-10-19 – 2015-10-21 (×4): via INTRAVENOUS

## 2015-10-19 MED ORDER — HEPARIN SODIUM (PORCINE) 5000 UNIT/ML IJ SOLN
5000.0000 [IU] | Freq: Three times a day (TID) | INTRAMUSCULAR | Status: DC
  Administered 2015-10-19 – 2015-10-21 (×4): 5000 [IU] via SUBCUTANEOUS
  Filled 2015-10-19 (×4): qty 1

## 2015-10-19 MED ORDER — INSULIN ASPART 100 UNIT/ML ~~LOC~~ SOLN
0.0000 [IU] | Freq: Three times a day (TID) | SUBCUTANEOUS | Status: DC
  Administered 2015-10-20: 09:00:00 3 [IU] via SUBCUTANEOUS
  Filled 2015-10-19: qty 3

## 2015-10-19 MED ORDER — PREGABALIN 75 MG PO CAPS
75.0000 mg | ORAL_CAPSULE | Freq: Three times a day (TID) | ORAL | Status: DC
  Administered 2015-10-19 – 2015-10-20 (×2): 75 mg via ORAL
  Filled 2015-10-19 (×2): qty 1

## 2015-10-19 MED ORDER — POLYETHYLENE GLYCOL 3350 17 G PO PACK: 17.0000 g | PACK | Freq: Every day | ORAL | Status: DC | PRN

## 2015-10-19 MED ORDER — ACETAMINOPHEN 325 MG PO TABS: 650.0000 mg | ORAL_TABLET | Freq: Four times a day (QID) | ORAL | Status: DC | PRN

## 2015-10-19 NOTE — ED Provider Notes (Signed)
[facility] Emergency Department Provider Note  ____________________________________________  Time seen: 5:30 PM  I have reviewed the triage vital signs and the nursing notes.   HISTORY  Chief Complaint Fall    HPI [name] Munoz is a 69 y.o. female who complains of generalized weakness today. Her legs are feeling so weak that when she was opening the door for a friend she fell down and fell backward. No loss of consciousness although she did hit the back of her head against the wall. Denies chest pain shortness of breath or recent illness. Noted by EMS to have an initial blood pressure of about 80/40, and they gave 500 mL of salinewith improvement.     Past Medical History  Diagnosis Date  . Diabetes mellitus without complication (HCC)   . Hypertension   . Coronary artery disease      Patient Active Problem List   Diagnosis Date Noted  . Intractable vomiting 09/18/2015     Past Surgical History  Procedure Laterality Date  . Percutaneous coronary rotoblator intervention (pci-r)    . Esophagogastroduodenoscopy N/A 09/20/2015    Procedure: ESOPHAGOGASTRODUODENOSCOPY (EGD);  Surgeon: Scot Jun, MD;  Location:  [facility] ENDOSCOPY;  Service: Endoscopy;  Laterality: N/A;     Current Outpatient Rx  Name  Route  Sig  Dispense  Refill  . aspirin EC 81 MG tablet   Oral   Take 81 mg by mouth daily.         Marland Kitchen atorvastatin (LIPITOR) 40 MG tablet   Oral   Take 40 mg by mouth daily.         Marland Kitchen buPROPion (WELLBUTRIN XL) 150 MG 24 hr tablet   Oral   Take 150 mg by mouth daily.         . citalopram (CELEXA) 40 MG tablet   Oral   Take 40 mg by mouth daily.         . clopidogrel (PLAVIX) 75 MG tablet   Oral   Take 75 mg by mouth daily.         . ergocalciferol (VITAMIN D2) 50000 UNITS capsule   Oral   Take 50,000 Units by mouth every 14 (fourteen) days. On the first and third Monday of each month         . insulin glargine (LANTUS)  100 UNIT/ML injection   Subcutaneous   Inject 50 Units into the skin daily.         Marland Kitchen lisinopril-hydrochlorothiazide (PRINZIDE,ZESTORETIC) 20-25 MG tablet   Oral   Take 1 tablet by mouth daily.         . metoprolol succinate (TOPROL-XL) 25 MG 24 hr tablet   Oral   Take 25 mg by mouth daily.         . pantoprazole (PROTONIX) 40 MG tablet   Oral   Take 1 tablet (40 mg total) by mouth 2 (two) times daily.   60 tablet   1   . pioglitazone (ACTOS) 15 MG tablet   Oral   Take 15 mg by mouth daily.         . polyethylene glycol (MIRALAX / GLYCOLAX) packet   Oral   Take 17 g by mouth daily as needed for mild constipation.         . pregabalin (LYRICA) 75 MG capsule   Oral   Take 75 mg by mouth 3 (three) times daily.         . ranitidine (ZANTAC) 150 MG tablet  Oral   Take 150 mg by mouth 2 (two) times daily.         . sucralfate (CARAFATE) 1 GM/10ML suspension   Oral   Take 10 mLs (1 g total) by mouth 4 (four) times daily.   420 mL   1   . ondansetron (ZOFRAN) 4 MG tablet      Take 1-2 tabs by mouth every 8 hours as needed for nausea/vomiting   30 tablet   0      Allergies Review of patient's allergies indicates no known allergies.   Family History  Problem Relation Age of Onset  . Diabetes Mellitus II Maternal Aunt   . Diabetes Mellitus I Other     Social History Social History  Substance Use Topics  . Smoking status: Never Smoker   . Smokeless tobacco: None  . Alcohol Use: No    Review of Systems  Constitutional:   No fever or chills. No weight changes Eyes:   No blurry vision or double vision.  ENT:   No sore throat. Cardiovascular:   No chest pain. Respiratory:   No dyspnea or cough. Gastrointestinal:   Negative for abdominal pain, vomiting and diarrhea.  No BRBPR or melena. Genitourinary:   Negative for dysuria, urinary retention, bloody urine, or difficulty urinating. Musculoskeletal:   Negative for back pain. No joint swelling  or pain. Skin:   Negative for rash. Neurological:   Negative for headaches, focal weakness or numbness. Psychiatric:  No anxiety or depression.   Endocrine:  No hot/cold intolerance, changes in energy, or sleep difficulty.  10-point ROS otherwise negative.  ____________________________________________   PHYSICAL EXAM:  VITAL SIGNS: ED Triage Vitals  Enc Vitals Group     BP 10/19/15 1718 107/52 mmHg     Pulse Rate 10/19/15 1718 84     Resp 10/19/15 1718 16     Temp 10/19/15 1718 99.1 F (37.3 C)     Temp Source 10/19/15 1718 Oral     SpO2 10/19/15 1718 96 %     Weight 10/19/15 1722 160 lb (72.576 kg)     Height 10/19/15 1722 5\' 9"  (1.753 m)     Head Cir --      Peak Flow --      Pain Score 10/19/15 1715 1     Pain Loc --      Pain Edu? --      Excl. in GC? --     Vital signs reviewed, nursing assessments reviewed.   Constitutional:   Alert and oriented. Well appearing and in no distress. Eyes:   No scleral icterus. No conjunctival pallor. PERRL. EOMI ENT   Head:   Normocephalic with very mild contusion on the occipital scalp. No hematoma, no lacerations.   Nose:   No congestion/rhinnorhea. No septal hematoma   Mouth/Throat:   Dry mucous membranes, no pharyngeal erythema. No peritonsillar mass. No uvula shift.   Neck:   No stridor. No SubQ emphysema. No meningismus. Hematological/Lymphatic/Immunilogical:   No cervical lymphadenopathy. Cardiovascular:   RRR. Normal and symmetric distal pulses are present in all extremities. No murmurs, rubs, or gallops. Respiratory:   Normal respiratory effort without tachypnea nor retractions. Breath sounds are clear and equal bilaterally. No wheezes/rales/rhonchi. Gastrointestinal:   Soft and nontender. No distention. There is no CVA tenderness.  No rebound, rigidity, or guarding. Genitourinary:   deferred Musculoskeletal:   Nontender with normal range of motion in all extremities. No joint effusions.  No lower extremity  tenderness.  No edema. Neurologic:   Normal speech and language.  CN 2-10 normal. Motor grossly intact. No pronator drift.  Normal gait. No gross focal neurologic deficits are appreciated.  Skin:    Skin is warm, dry and intact. No rash noted.  No petechiae, purpura, or bullae. Psychiatric:   Mood and affect are normal. Speech and behavior are normal. Patient exhibits appropriate insight and judgment.  ____________________________________________    LABS (pertinent positives/negatives) (all labs ordered are listed, but only abnormal results are displayed) Labs Reviewed  CBC WITH DIFFERENTIAL/PLATELET - Abnormal; Notable for the following:    WBC 14.6 (*)    Neutro Abs 11.5 (*)    Monocytes Absolute 1.0 (*)    All other components within normal limits  BASIC METABOLIC PANEL - Abnormal; Notable for the following:    Sodium 129 (*)    Chloride 93 (*)    Glucose, Bld 382 (*)    BUN 61 (*)    Creatinine, Ser 4.36 (*)    GFR calc non Af Amer 10 (*)    GFR calc Af Amer 11 (*)    All other components within normal limits  GLUCOSE, CAPILLARY - Abnormal; Notable for the following:    Glucose-Capillary 339 (*)    All other components within normal limits  URINALYSIS COMPLETEWITH MICROSCOPIC ([facility] ONLY)   ____________________________________________   EKG  Interpreted by me  Date: 10/19/2015  Rate: 84  Rhythm: normal sinus rhythm  QRS Axis: normal  Intervals: normal  ST/T Wave abnormalities: normal  Conduction Disutrbances: none  Narrative Interpretation: unremarkable      ____________________________________________    RADIOLOGY    ____________________________________________   PROCEDURES   ____________________________________________   INITIAL IMPRESSION / ASSESSMENT AND PLAN / ED COURSE  Pertinent labs & imaging results that were available during my care of the patient were reviewed by me and considered in my medical decision making (see chart for  details).  Patient presents with hyperglycemia with an initial glucose of about 500. On recheck it is down to about 330 after 1 L of saline.we'll continue with IV fluids as she clinically appears to be dehydrated while we check labs.  ----------------------------------------- 7:18 PM on 10/19/2015 -----------------------------------------  Labs reveal mild hyponatremia which is likely due to her hyperglycemia. However she also has some episodes of hypotension and a creatinine of 4.3 which appears to be an acute change consistent with hypovolemia and dehydration causing acute renal failure. She's not yet been able to produce any urine. Low suspicion for post renal outlet obstruction.case discussed with hospitalist for admission. Continue IV fluids.     ____________________________________________   FINAL CLINICAL IMPRESSION(S) / ED DIAGNOSES  Final diagnoses:  Hyperglycemia  Dehydration  Acute renal failure, unspecified acute renal failure type [facility])      Sharman Cheek, MD 10/19/15 1919

## 2015-10-19 NOTE — ED Notes (Signed)
MD at bedside. 

## 2015-10-19 NOTE — H&P (Signed)
[facility]   PATIENT NAME: [name] NeatSusan Yahr    MR#:  [id number]  DATE OF BIRTH:  01-07-1946  DATE OF ADMISSION:  10/19/2015  PRIMARY CARE PHYSICIAN: Delton PrairieBROOKS, KEATAH B, FNP   REQUESTING/REFERRING PHYSICIAN: Sharman CheekPhillip Stafford MD  CHIEF COMPLAINT:   Chief Complaint  Patient presents with  . Fall    HISTORY OF PRESENT ILLNESS: [name] Munoz  is a 69 y.o. female with a known history of diabetes type 2, hypertension, coronary artery disease who is brought to the ED after a fall. Patient reports that she started feeling weak earlier around 4:00. And she had a visitor that came to her house when they were leaving she closed the door. Subsequently she fell backwards on the floor. When EMS arrived they noticed her blood pressure was in the 80s. She was brought to the emergency room and was noted to have an acute renal failure her creatinine was normal last month. She is denying any diarrhea or vomiting. She denies any chest pain or shortness of breath denies any urinary symptoms.    PAST MEDICAL HISTORY:   Past Medical History  Diagnosis Date  . Diabetes mellitus without complication (HCC)   . Hypertension   . Coronary artery disease   . GERD (gastroesophageal reflux disease)   . Depression     PAST SURGICAL HISTORY:  Past Surgical History  Procedure Laterality Date  . Percutaneous coronary rotoblator intervention (pci-r)    . Esophagogastroduodenoscopy N/A 09/20/2015    Procedure: ESOPHAGOGASTRODUODENOSCOPY (EGD);  Surgeon: Scot Junobert T Elliott, MD;  [facility];  Service: Endoscopy;  Laterality: N/A;    SOCIAL HISTORY:  Social History  Substance Use Topics  . Smoking status: Never Smoker   . Smokeless tobacco: Not on file  . Alcohol Use: No    FAMILY HISTORY:  Family History  Problem Relation Age of Onset  . Diabetes Mellitus II Maternal Aunt   . Diabetes Mellitus I Other     DRUG ALLERGIES: No Known  Allergies  REVIEW OF SYSTEMS:   CONSTITUTIONAL: No fever, positive fatigness and  weakness.  EYES: No blurred or double vision.  EARS, NOSE, AND THROAT: No tinnitus or ear pain.  RESPIRATORY: No cough, shortness of breath, wheezing or hemoptysis.  CARDIOVASCULAR: No chest pain, orthopnea, edema.  GASTROINTESTINAL: No nausea, vomiting, diarrhea or abdominal pain.  GENITOURINARY: No dysuria, hematuria.  ENDOCRINE: No polyuria, nocturia,  HEMATOLOGY: No anemia, easy bruising or bleeding SKIN: No rash or lesion. MUSCULOSKELETAL: No joint pain or arthritis.   NEUROLOGIC: No tingling, numbness, weakness.  PSYCHIATRY: No anxiety or depression.   MEDICATIONS AT HOME:  Prior to Admission medications   Medication Sig Start Date End Date Taking? Authorizing Provider  aspirin EC 81 MG tablet Take 81 mg by mouth daily.   Yes Historical Provider, MD  atorvastatin (LIPITOR) 40 MG tablet Take 40 mg by mouth daily.   Yes Historical Provider, MD  buPROPion (WELLBUTRIN XL) 150 MG 24 hr tablet Take 150 mg by mouth daily.   Yes Historical Provider, MD  citalopram (CELEXA) 40 MG tablet Take 40 mg by mouth daily.   Yes Historical Provider, MD  clopidogrel (PLAVIX) 75 MG tablet Take 75 mg by mouth daily.   Yes Historical Provider, MD  ergocalciferol (VITAMIN D2) 50000 UNITS capsule Take 50,000 Units by mouth every 14 (fourteen) days. On the first and third Monday of each month   Yes Historical Provider, MD  insulin glargine (LANTUS) 100 UNIT/ML injection  Inject 50 Units into the skin daily.   Yes Historical Provider, MD  lisinopril-hydrochlorothiazide (PRINZIDE,ZESTORETIC) 20-25 MG tablet Take 1 tablet by mouth daily.   Yes Historical Provider, MD  metoprolol succinate (TOPROL-XL) 25 MG 24 hr tablet Take 25 mg by mouth daily.   Yes Historical Provider, MD  pantoprazole (PROTONIX) 40 MG tablet Take 1 tablet (40 mg total) by mouth 2 (two) times daily. 09/18/15 09/17/16 Yes [name], MD  pioglitazone (ACTOS)  15 MG tablet Take 15 mg by mouth daily.   Yes Historical Provider, MD  polyethylene glycol (MIRALAX / GLYCOLAX) packet Take 17 g by mouth daily as needed for mild constipation.   Yes Historical Provider, MD  pregabalin (LYRICA) 75 MG capsule Take 75 mg by mouth 3 (three) times daily.   Yes Historical Provider, MD  ranitidine (ZANTAC) 150 MG tablet Take 150 mg by mouth 2 (two) times daily.   Yes Historical Provider, MD  sucralfate (CARAFATE) 1 GM/10ML suspension Take 10 mLs (1 g total) by mouth 4 (four) times daily. 09/18/15 09/17/16 Yes [name], MD  ondansetron [facility]) 4 MG tablet Take 1-2 tabs by mouth every 8 hours as needed for nausea/vomiting 09/18/15   [name], MD      PHYSICAL EXAMINATION:   VITAL SIGNS: Blood pressure 103/42, pulse 77, temperature 99.1 F (37.3 C), temperature source Oral, resp. rate 19, height  (1.753 m), weight 72.576 kg (160 lb), SpO2 97 %.  GENERAL:  69 y.o.-year-old patient lying in the bed with no acute distress.  EYES: Pupils equal, round, reactive to light and accommodation. No scleral icterus. Extraocular muscles intact.  HEENT: Head atraumatic, normocephalic. Oropharynx and nasopharynx clear.  NECK:  Supple, no jugular venous distention. No thyroid enlargement, no tenderness.  LUNGS: Normal breath sounds bilaterally, no wheezing, rales,rhonchi or crepitation. No use of accessory muscles of respiration.  CARDIOVASCULAR: S1, S2 normal. No murmurs, rubs, or gallops.  ABDOMEN: Soft, nontender, nondistended. Bowel sounds present. No organomegaly or mass.  EXTREMITIES: No pedal edema, cyanosis, or clubbing.  NEUROLOGIC: Cranial nerves II through XII are intact. Muscle strength 5/5 in all extremities. Sensation intact. Gait not checked.  PSYCHIATRIC: The patient is alert and oriented x 3.  SKIN: No obvious rash, lesion, or ulcer.   LABORATORY PANEL:   CBC  Recent Labs Lab 10/19/15 1721  WBC 14.6*  HGB 12.9  HCT 40.2  PLT 200  MCV 88.7   MCH 28.6  MCHC 32.2  RDW 12.9  LYMPHSABS 2.0  MONOABS 1.0*  EOSABS 0.1  BASOSABS 0.1   ------------------------------------------------------------------------------------------------------------------  Chemistries   Recent Labs Lab 10/19/15 1721  NA 129*  K 4.6  CL 93*  CO2 23  GLUCOSE 382*  BUN 61*  CREATININE 4.36*  CALCIUM 8.9   ------------------------------------------------------------------------------------------------------------------ estimated creatinine clearance is 12.9 mL/min (by C-G formula based on Cr of 4.36). ------------------------------------------------------------------------------------------------------------------ No results for input(s): TSH, T4TOTAL, T3FREE, THYROIDAB in the last 72 hours.  Invalid input(s): FREET3   Coagulation profile No results for input(s): INR, PROTIME in the last 168 hours. ------------------------------------------------------------------------------------------------------------------- No results for input(s): DDIMER in the last 72 hours. -------------------------------------------------------------------------------------------------------------------  Cardiac Enzymes No results for input(s): CKMB, TROPONINI, MYOGLOBIN in the last 168 hours.  Invalid input(s): CK ------------------------------------------------------------------------------------------------------------------ Invalid input(s): POCBNP  ---------------------------------------------------------------------------------------------------------------  Urinalysis    Component Value Date/Time   COLORURINE Amber 05/30/2013 2026   APPEARANCEUR Cloudy 05/30/2013 2026   LABSPEC 1.014 05/30/2013 2026   PHURINE 5.0 05/30/2013 2026   GLUCOSEU 50 mg/dL 40/98/1191 4782  HGBUR 1+ 05/30/2013 2026   BILIRUBINUR Negative 05/30/2013 2026   KETONESUR Negative 05/30/2013 2026   PROTEINUR 30 mg/dL 16/07/9603 5409   NITRITE Negative 05/30/2013 2026    LEUKOCYTESUR 3+ 05/30/2013 2026     RADIOLOGY: No results found.  EKG: Orders placed or performed during the hospital encounter of 10/19/15  . ED EKG  . ED EKG  . EKG 12-Lead  . EKG 12-Lead    IMPRESSION AND PLAN: Patient is a 69 year old white female presenting with a fall and noted to have hypotension by EMS and acute renal failure  1. Acute renal failure: Could be due to ATN as a result of hypotension, no evidence of fluid loss, I will do a renal ultrasound, bladder scan, hold lisinopril and HCTZ. Nephrology consult in the morning check a urinalysis  2. Hypotension exact etiology unknown, I will check a random cortisol level, IV fluids no evidence of sepsis  3. Coronary artery disease continue aspirin and Plavix  4. Hyperlipidemia continue Lipitor  5. GERD continue PPI   6. Diabetes type 2 continue Lantus and Actos place patient on sliding scale insulin  7. Miscellaneous heparin for DVT prophylaxis  All the records are reviewed and case discussed with ED provider. Management plans discussed with the patient, family and they are in agreement.  CODE STATUS: full     TOTAL TIME TAKING CARE OF THIS PATIENT: 50 min spent  Auburn Bilberry M.D on 10/19/2015 at 7:37 PM  Between 7am to 6pm - Pager - [phone]  After 6pm go to [email or web address] - password [email or web address] [facility]  [facility]  Office  [phone]  CC: Primary care physician; Delton Prairie, FNP

## 2015-10-19 NOTE — ED Notes (Signed)
Pt states her legs have been feeling weak today, pt had fall, denies any LOC. Pt A&O, complains of slight head pain where she fell. No bleeding noted, small knot on occipital noted.

## 2015-10-19 NOTE — ED Notes (Signed)
Pt from [address] that fell today, denies any LOC, pt c/o being weak at the knees all day. Pt hypotensive and cbg 400 per ems. Pt A&O , no noted bruises, denies taking any anticoags. Pt vs stable and fluid running now.

## 2015-10-20 ENCOUNTER — Inpatient Hospital Stay: Payer: Medicare (Managed Care)

## 2015-10-20 LAB — GLUCOSE, CAPILLARY
GLUCOSE-CAPILLARY: 214 mg/dL — AB (ref 65–99)
GLUCOSE-CAPILLARY: 196 mg/dL — AB (ref 65–99)
Glucose-Capillary: 252 mg/dL — ABNORMAL HIGH (ref 65–99)
Glucose-Capillary: 179 mg/dL — ABNORMAL HIGH (ref 65–99)

## 2015-10-20 LAB — HEMOGLOBIN A1C: HEMOGLOBIN A1C: 10.9 % — AB (ref 4.0–6.0)

## 2015-10-20 LAB — BASIC METABOLIC PANEL
Anion gap: 6 (ref 5–15)
BUN: 52 mg/dL — AB (ref 6–20)
CHLORIDE: 105 mmol/L (ref 101–111)
CO2: 23 mmol/L (ref 22–32)
Calcium: 8.4 mg/dL — ABNORMAL LOW (ref 8.9–10.3)
Creatinine, Ser: 2.91 mg/dL — ABNORMAL HIGH (ref 0.44–1.00)
GFR calc Af Amer: 18 mL/min — ABNORMAL LOW (ref >60)
GFR, EST NON AFRICAN AMERICAN: 16 mL/min — AB (ref >60)
GLUCOSE: 231 mg/dL — AB (ref 65–99)
POTASSIUM: 3.9 mmol/L (ref 3.5–5.1)
SODIUM: 134 mmol/L — AB (ref 135–145)

## 2015-10-20 LAB — CBC
HEMATOCRIT: 38.4 % (ref 35.0–47.0)
HEMOGLOBIN: 12.2 g/dL (ref 12.0–16.0)
MCH: 28.4 pg (ref 26.0–34.0)
MCHC: 31.9 g/dL — AB (ref 32.0–36.0)
MCV: 88.9 fL (ref 80.0–100.0)
Platelets: 167 10*3/uL (ref 150–440)
RBC: 4.32 MIL/uL (ref 3.80–5.20)
RDW: 13 % (ref 11.5–14.5)
WBC: 12.1 10*3/uL — ABNORMAL HIGH (ref 3.6–11.0)

## 2015-10-20 MED ORDER — DEXTROSE 5 % IV SOLN
1.0000 g | INTRAVENOUS | Status: DC
  Administered 2015-10-20 – 2015-10-21 (×2): 1 g via INTRAVENOUS
  Filled 2015-10-20 (×2): qty 10

## 2015-10-20 MED ORDER — PREGABALIN 75 MG PO CAPS
75.0000 mg | ORAL_CAPSULE | Freq: Every day | ORAL | Status: DC
  Administered 2015-10-21: 09:00:00 75 mg via ORAL
  Filled 2015-10-20: qty 1

## 2015-10-20 MED ORDER — FAMOTIDINE 20 MG PO TABS
20.0000 mg | ORAL_TABLET | Freq: Every day | ORAL | Status: DC
  Administered 2015-10-21: 20 mg via ORAL
  Filled 2015-10-20: qty 1

## 2015-10-20 MED ORDER — INSULIN ASPART 100 UNIT/ML ~~LOC~~ SOLN
0.0000 [IU] | Freq: Three times a day (TID) | SUBCUTANEOUS | Status: DC
  Administered 2015-10-20: 5 [IU] via SUBCUTANEOUS
  Administered 2015-10-20: 17:00:00 2 [IU] via SUBCUTANEOUS
  Administered 2015-10-21: 5 [IU] via SUBCUTANEOUS
  Filled 2015-10-20: qty 2
  Filled 2015-10-20 (×2): qty 5

## 2015-10-20 MED ORDER — INSULIN ASPART 100 UNIT/ML ~~LOC~~ SOLN: 0.0000 [IU] | Freq: Every day | SUBCUTANEOUS | Status: DC

## 2015-10-20 NOTE — Progress Notes (Addendum)
Inpatient Diabetes Program Recommendations  AACE/ADA: New Consensus Statement on Inpatient Glycemic Control (2015)  Target Ranges:  Prepandial:   less than 140 mg/dL      Peak postprandial:   less than 180 mg/dL (1-2 hours)      Critically ill patients:  140 - 180 mg/dL   Review of Glycemic Control  Results for [name] Munoz, Brylin (MRN [id number]) as of 10/20/2015 11:09  Ref. Range 09/20/2015 11:42 10/19/2015 17:49 10/19/2015 22:19 10/19/2015 22:20 10/20/2015 08:01  Glucose-Capillary Latest Ref Range: 65-99 mg/dL 409174 (H) 811339 (H) 914394 (H) 339 (H) 214 (H)    Diabetes history: Type 2, A1C 10.9% on 09/18/15 Outpatient Diabetes medications: Lantus 50 units qhs, Actos 15 mg qday Current orders for Inpatient glycemic control: Lantus 50 units qhs, Actos 15 mg qday, Novolog 0-9 units tid with meals, Novolog 0-5 units qhs  Inpatient Diabetes Program Recommendations: Agree with current orders for diabetes medication management. Will follow.   Susette RacerJulie Marsia Cino, RN, BA, MHA, CDE Diabetes Coordinator Inpatient Diabetes Program  [phone] (Team Pager) [phone] [facility] Office) 10/20/2015 8:27 AM

## 2015-10-20 NOTE — Progress Notes (Signed)
[facility] [place] [facility]   PATIENT NAME: Tilman NeatSusan Shimel    MR#:  [id number]  DATE OF BIRTH:  10-17-1946  SUBJECTIVE:  CHIEF COMPLAINT:   Chief Complaint  Patient presents with  . Fall   Feeling well this morning. No complaints.  REVIEW OF SYSTEMS:   Review of Systems  Constitutional: Negative for fever.  Respiratory: Negative for shortness of breath.   Cardiovascular: Negative for chest pain and palpitations.  Gastrointestinal: Negative for nausea, vomiting and abdominal pain.  Genitourinary: Negative for dysuria.    DRUG ALLERGIES:  No Known Allergies  VITALS:  Blood pressure 117/45, pulse 78, temperature 97.5 F (36.4 C), temperature source Oral, resp. rate 18, height 5\' 9"  (1.753 m), weight 72.576 kg (160 lb), SpO2 98 %.  PHYSICAL EXAMINATION:  GENERAL:  69 y.o.-year-old patient lying in the bed with no acute distress.  LUNGS: Normal breath sounds bilaterally, no wheezing, rales,rhonchi or crepitation. No use of accessory muscles of respiration.  CARDIOVASCULAR: S1, S2 normal. No murmurs, rubs, or gallops.  ABDOMEN: Soft, nontender, nondistended. Bowel sounds present. No organomegaly or mass.  EXTREMITIES: No pedal edema, cyanosis, or clubbing.  NEUROLOGIC: Cranial nerves II through XII are intact. Muscle strength 5/5 in all extremities. Sensation intact. Gait not checked.  PSYCHIATRIC: The patient is alert and oriented x 3.  SKIN: No obvious rash, lesion, or ulcer.    LABORATORY PANEL:   CBC  Recent Labs Lab 10/20/15 0530  WBC 12.1*  HGB 12.2  HCT 38.4  PLT 167   ------------------------------------------------------------------------------------------------------------------  Chemistries   Recent Labs Lab 10/20/15 0530  NA 134*  K 3.9  CL 105  CO2 23  GLUCOSE 231*  BUN 52*  CREATININE 2.91*  CALCIUM 8.4*    ------------------------------------------------------------------------------------------------------------------  Cardiac Enzymes No results for input(s): TROPONINI in the last 168 hours. ------------------------------------------------------------------------------------------------------------------  RADIOLOGY:  No results found.  EKG:   Orders placed or performed during the hospital encounter of 10/19/15  . ED EKG  . ED EKG  . EKG 12-Lead  . EKG 12-Lead    ASSESSMENT AND PLAN:   Patient is a 69 year old white female presenting with a fall and noted to have hypotension by EMS and acute renal failure  1. Acute renal failure:  - Improving with hydration, continue - Likely due to ATN, hypovolemia. She reports hyperglycemia and fairly low oral intake. - Continue to hold lisinopril and HCTZ - Nephrology consultation is pending  2. Urinary tract infection  - Urine and blood cultures are pending - Start Rocephin - This is the likely cause of her hypotension along with decreased oral intake  3. Coronary artery disease  - No chest pain or shortness of breath - continue aspirin and Plavix  4. Hyperlipidemia continue Lipitor  5. GERD continue PPI   6. Diabetes type 2 - Check A1c - Continue Lantus and sliding scale, and nighttime coverage, continue Actos  7. Miscellaneous  - heparin for DVT prophylaxis - Physical therapy consultation after fall  All the records are reviewed and case discussed with Care Management/Social Workerr. Management plans discussed with the patient, family and they are in agreement.  CODE STATUS: Full  TOTAL TIME TAKING CARE OF THIS PATIENT: 20 minutes.  Greater than 50% of time spent in care coordination and counseling. POSSIBLE D/C IN 2 DAYS, DEPENDING ON CLINICAL CONDITION.   Elby ShowersWALSH, Nelani Schmelzle M.D on 10/20/2015 at 9:10 AM  Between 7am to 6pm - Pager - [phone]  After 6pm go to [email or web address] -  password [email or web address] [facility]  [facility]  [facility]ospitalists  Office  [phone]  CC: Primary care physician; Delton Prairie, FNP

## 2015-10-20 NOTE — Plan of Care (Signed)
Problem: Safety: Goal: Ability to remain free from injury will improve Outcome: Progressing High fall risk due to generalized weakness & fall at home yesterday. Bed alarm on, hourly rounding. Safe environment provided. Pt understands how to use call system for assistance.   Problem: Skin Integrity: Goal: Risk for impaired skin integrity will decrease Outcome: Progressing Sacrum excoriated due to incontinence at times. Skin care provided. Pt able to turn independently.  Problem: Fluid Volume: Goal: Ability to maintain a balanced intake and output will improve Outcome: Progressing IVF infusing as ordered. BUN 61, Creatinine 4.36. Will be rechecked this AM. Nephrology consulted. Intake/output being measured closely.

## 2015-10-20 NOTE — Consult Note (Signed)
Date: 10/20/2015                  Patient Name:  [name] Munoz  MRN: [id number]  DOB: 12/11/1945  Age / Sex: 69 y.o., female         PCP: Delton Prairie, FNP                 Service Requesting Consult:  internal medicine                  Reason for Consult:  acute renal failure             History of Present Illness: Patient is a 69 y.o. female with medical problems of diabetes for over 25 years with peripheral neuropathy, hypertension, coronary disease, GERD, depression, who was admitted to [facility] on 10/19/2015 for evaluation status post fall.  Patient was brought to the emergency room after she lost balance and fell backwards. According to notes, when EMS arrived, her blood pressure was in the 80s. Patient denies loss of consciousness. She had a bump on her head. No fractures In the ER, her serum creatinine was noted to be 4.36. Her baseline is 1.0 from November 21. After IV hydration, her serum creatinine has improved to 2.91. She does not have any acute complaints No blood in the urine No problems voiding  Home medications do include lisinopril/HCTZ 20/25, metoprolol 25 mg daily   Medications: Outpatient medications: Prescriptions prior to admission  Medication Sig Dispense Refill Last Dose  . aspirin EC 81 MG tablet Take 81 mg by mouth daily.   10/19/2015 at 0700  . atorvastatin (LIPITOR) 40 MG tablet Take 40 mg by mouth daily.   10/19/2015 at 0700  . buPROPion (WELLBUTRIN XL) 150 MG 24 hr tablet Take 150 mg by mouth daily.   10/19/2015 at 0700  . citalopram (CELEXA) 40 MG tablet Take 40 mg by mouth daily.   10/19/2015 at 0700  . clopidogrel (PLAVIX) 75 MG tablet Take 75 mg by mouth daily.   10/19/2015 at 0700  . ergocalciferol (VITAMIN D2) 50000 UNITS capsule Take 50,000 Units by mouth every 14 (fourteen) days. On the first and third Monday of each month   Past Month at Unknown time  . insulin glargine (LANTUS) 100 UNIT/ML injection Inject 50 Units into the skin daily.    10/19/2015 at 0700  . lisinopril-hydrochlorothiazide (PRINZIDE,ZESTORETIC) 20-25 MG tablet Take 1 tablet by mouth daily.   10/19/2015 at 0700  . metoprolol succinate (TOPROL-XL) 25 MG 24 hr tablet Take 25 mg by mouth daily.   10/19/2015 at 0700  . pantoprazole (PROTONIX) 40 MG tablet Take 1 tablet (40 mg total) by mouth 2 (two) times daily. 60 tablet 1 10/19/2015 at 0700  . pioglitazone (ACTOS) 15 MG tablet Take 15 mg by mouth daily.   10/19/2015 at Unknown time  . polyethylene glycol (MIRALAX / GLYCOLAX) packet Take 17 g by mouth daily as needed for mild constipation.   prn at prn  . pregabalin (LYRICA) 75 MG capsule Take 75 mg by mouth 3 (three) times daily.   10/19/2015 at 0700  . ranitidine (ZANTAC) 150 MG tablet Take 150 mg by mouth 2 (two) times daily.   10/19/2015 at 0700  . sucralfate (CARAFATE) 1 GM/10ML suspension Take 10 mLs (1 g total) by mouth 4 (four) times daily. 420 mL 1 10/19/2015 at 0700  . ondansetron (ZOFRAN) 4 MG tablet Take 1-2 tabs by mouth every 8 hours as needed for nausea/vomiting  30 tablet 0 prn at pr    Current medications: Current Facility-Administered Medications  Medication Dose Route Frequency Provider Last Rate Last Dose  . 0.9 %  sodium chloride infusion   Intravenous Continuous Auburn Bilberry, MD 125 mL/hr at 10/19/15 2138    . acetaminophen (TYLENOL) tablet 650 mg  650 mg Oral Q6H PRN Auburn Bilberry, MD       Or  . acetaminophen (TYLENOL) suppository 650 mg  650 mg Rectal Q6H PRN Auburn Bilberry, MD      . aspirin EC tablet 81 mg  81 mg Oral Daily Auburn Bilberry, MD   81 mg at 10/20/15 0835  . atorvastatin (LIPITOR) tablet 40 mg  40 mg Oral Daily Auburn Bilberry, MD   40 mg at 10/20/15 0835  . buPROPion (WELLBUTRIN XL) 24 hr tablet 150 mg  150 mg Oral Daily Auburn Bilberry, MD   150 mg at 10/20/15 0835  . cefTRIAXone (ROCEPHIN) 1 g in dextrose 5 % 50 mL IVPB  1 g Intravenous Q24H Gale Journey, MD      . citalopram (CELEXA) tablet 40 mg  40 mg Oral Daily  Auburn Bilberry, MD   40 mg at 10/20/15 0835  . clopidogrel (PLAVIX) tablet 75 mg  75 mg Oral Daily Auburn Bilberry, MD   75 mg at 10/20/15 0835  . ergocalciferol (VITAMIN D2) capsule 50,000 Units  50,000 Units Oral Q14 Days Auburn Bilberry, MD   50,000 Units at 10/19/15 2119  . famotidine (PEPCID) tablet 20 mg  20 mg Oral BID Auburn Bilberry, MD   20 mg at 10/20/15 0835  . heparin injection 5,000 Units  5,000 Units Subcutaneous 3 times per day Auburn Bilberry, MD   5,000 Units at 10/19/15 2146  . HYDROcodone-acetaminophen (NORCO/VICODIN) 5-325 MG per tablet 1-2 tablet  1-2 tablet Oral Q4H PRN Auburn Bilberry, MD      . insulin aspart (novoLOG) injection 0-5 Units  0-5 Units Subcutaneous QHS Gale Journey, MD      . insulin aspart (novoLOG) injection 0-9 Units  0-9 Units Subcutaneous TID WC Gale Journey, MD      . insulin glargine (LANTUS) injection 50 Units  50 Units Subcutaneous Daily Auburn Bilberry, MD   50 Units at 10/20/15 [phone]  . pantoprazole (PROTONIX) EC tablet 40 mg  40 mg Oral BID Auburn Bilberry, MD   40 mg at 10/20/15 0835  . pioglitazone (ACTOS) tablet 15 mg  15 mg Oral Daily Auburn Bilberry, MD   15 mg at 10/20/15 0835  . polyethylene glycol (MIRALAX / GLYCOLAX) packet 17 g  17 g Oral Daily PRN Auburn Bilberry, MD      . pregabalin (LYRICA) capsule 75 mg  75 mg Oral TID Auburn Bilberry, MD   75 mg at 10/20/15 0835  . sucralfate (CARAFATE) 1 GM/10ML suspension 1 g  1 g Oral QID Auburn Bilberry, MD   1 g at 10/20/15 9604      Allergies: No Known Allergies    Past Medical History: Past Medical History  Diagnosis Date  . Diabetes mellitus without complication (HCC)   . Hypertension   . Coronary artery disease   . GERD (gastroesophageal reflux disease)   . Depression      Past Surgical History: Past Surgical History  Procedure Laterality Date  . Percutaneous coronary rotoblator intervention (pci-r)    . Esophagogastroduodenoscopy N/A 09/20/2015    Procedure:  ESOPHAGOGASTRODUODENOSCOPY (EGD);  Surgeon: Scot Jun, MD;  Location: [facility] ENDOSCOPY;  Service: Endoscopy;  Laterality: N/A;     Family History: Family History  Problem Relation Age of Onset  . Diabetes Mellitus II Maternal Aunt   . Diabetes Mellitus I Other      Social History: Social History   Social History  . Marital Status: Legally Separated    Spouse Name: N/A  . Number of Children: N/A  . Years of Education: N/A   Occupational History  . Not on file.   Social History Main Topics  . Smoking status: Never Smoker   . Smokeless tobacco: Never Used  . Alcohol Use: No  . Drug Use: No  . Sexual Activity: Not on file   Other Topics Concern  . Not on file   Social History Narrative     Review of Systems: Gen: No fevers, chills or weight loss HEENT: No sore throat, problems with vision CV: No chest pain Resp: Some cough with clear sputum GI: Appetite is good, no nausea or vomiting GU : No blood in the urine, no problems reported MS: Bump on the head, no other problems reported, patient is able to ambulate at home Derm:  No problems reported Psych: No problems reported Heme: No problems reported Neuro: Some weakness in the hands Endocrine, long-standing diabetes  Vital Signs: Blood pressure 117/45, pulse 78, temperature 97.5 F (36.4 C), temperature source Oral, resp. rate 18, height 5\' 9"  (1.753 m), weight 72.576 kg (160 lb), SpO2 98 %.   Intake/Output Summary (Last 24 hours) at 10/20/15 4098 Last data filed at 10/20/15 0335  Gross per 24 hour  Intake      0 ml  Output    550 ml  Net   -550 ml    Weight trends: [employer]   10/19/15 1722  Weight: 72.576 kg (160 lb)    Physical Exam: General:  well-appearing, laying in the bed   HEENT  anicteric sclera, pupils are round, moist oral mucous membranes   Neck:  supple, no masses   Lungs:  normal respiratory effort, clear to auscultation bilaterally   Heart::  regular rate and rhythm, no  rub or gallop   Abdomen:  soft, nontender, nondistended   Extremities:  no peripheral edema   Neurologic:  alert, oriented, wasting of small muscles of the hands noted bilaterally   Skin:  no acute rashes   Access:   Foley:        Lab results: Basic Metabolic Panel:  Recent Labs Lab 10/19/15 1721 10/20/15 0530  NA 129* 134*  K 4.6 3.9  CL 93* 105  CO2 23 23  GLUCOSE 382* 231*  BUN 61* 52*  CREATININE 4.36* 2.91*  CALCIUM 8.9 8.4*    Liver Function Tests: No results for input(s): AST, ALT, ALKPHOS, BILITOT, PROT, ALBUMIN in the last 168 hours. No results for input(s): LIPASE, AMYLASE in the last 168 hours. No results for input(s): AMMONIA in the last 168 hours.  CBC:  Recent Labs Lab 10/19/15 1721 10/20/15 0530  WBC 14.6* 12.1*  NEUTROABS 11.5*  --   HGB 12.9 12.2  HCT 40.2 38.4  MCV 88.7 88.9  PLT 200 167    Cardiac Enzymes: No results for input(s): CKTOTAL, TROPONINI in the last 168 hours.  BNP: Invalid input(s): POCBNP  CBG:  Recent Labs Lab 10/19/15 1749 10/19/15 2219 10/19/15 2220 10/20/15 0801  GLUCAP 339* 394* 339* 214*    Microbiology: No results found for this or any previous visit (from the past 720 hour(s)).   Coagulation Studies: No results for  input(s): LABPROT, INR in the last 72 hours.  Urinalysis:  Recent Labs  10/19/15 2053  COLORURINE YELLOW*  LABSPEC 1.009  PHURINE 5.0  GLUCOSEU 50*  HGBUR 1+*  BILIRUBINUR NEGATIVE  KETONESUR NEGATIVE  PROTEINUR NEGATIVE  NITRITE NEGATIVE  LEUKOCYTESUR 3+*        Imaging:  No results found.   Assessment & Plan: Pt is a 69 y.o. yo female with a PMHX of long-standing diabetes for over 25 years, coronary disease, hypertension, GERD, was admitted on 10/19/2015 status post fall at home and is found to have acute renal failure  1. Acute renal failure  - Baseline creatinine of 1.0 on November 21 - likely combination of volume depletion and ATN from hypotension. Patient's  home medications include ACE inhibitor, diuretic and metoprolol - Agree with holding all blood pressure medications - Agree with IV hydration  2. Urinary tract infection - Send urine for culture - Treated with antibiotics accordingly  3. Will obtain urine microalbumin/creatinine ratio  Her blood pressure currently is borderline low

## 2015-10-20 NOTE — Plan of Care (Signed)
Pt admitted after fall at home -Fluid Volume:  acute renal failure from deydration. Kidney function improved w/hydration. BUN and creatinine now 52 and 2.91 respectively.  Pt has also been hyperglycemic - being followed by diabetes educator.  On sliding scale and bedtime lantus.  Holding BP meds b/c of hypotension.  UTI w/blood and urine cultures pending.  Started on IV abx.   U/S of kidneys - was neg for hydronephrosis.   Safety: pt wked w/PT.  Up to chair.  Pt is steady w/1person asst.  Remained safe w/hourly rounding.  Also not impulsive and will call for help. Skin Integrity: Pt has some excoriation from incontinence.  Using barrier cream.

## 2015-10-20 NOTE — Progress Notes (Signed)
   10/20/15 1000  Clinical Encounter Type  Visited With Patient  Visit Type Initial  Referral From Nurse  Consult/Referral To Chaplain  Spiritual Encounters  Spiritual Needs Literature  Advance Directives (For Healthcare)  Does patient have an advance directive? No  Would patient like information on creating an advanced directive? Yes - Transport plannerducational materials given  Provided pastoral presence, support and Advance Directive education and forms to patient on unit.  Pt will contact us if/when she is ready to complete.  [employer]bury Automotive GroupChaplain Kailah Pennel-pager [phone]

## 2015-10-20 NOTE — Care Management (Signed)
Admitted to [facility] with the diagnosis of acute renal failure. Lives alone at [address] apartment complex x 1 year. Daughter is Tamala Serngela Walker [phone]). [facility] since July 2013. [facility]. Will update [facility] program concerning admission. No home Health. No skilled facility. No home oxygen. Uses a rollator walker to aid in ambulation. Takes care of all basic activities of daily living herself. Can drive, but has no car. Daughter and friend helps with errands. Good appetite. Larey SeatFell prior to this admission.  [facility] or family will transport home. Gwenette GreetBrenda S Jeane Cashatt RN MSN  [facility] Care Management [phone]

## 2015-10-20 NOTE — Progress Notes (Addendum)
Initial Nutrition Assessment   INTERVENTION:   Meals and Snacks: Cater to patient preferences Medical Food Supplement Therapy: will recommend on follow if po intake inadequate   NUTRITION DIAGNOSIS:   No nutrition diagnosis at this time  GOAL:   Patient will meet greater than or equal to 90% of their needs  MONITOR:    (Energy Intake, Electrolyte and renal Profile, Anthropometrics, Digestive System, Glucose Profile)  REASON FOR ASSESSMENT:   Diagnosis    ASSESSMENT:   Pt admitted with acute renal failure s/p fall. [facility].  Past Medical History  Diagnosis Date  . Diabetes mellitus without complication (HCC)   . Hypertension   . Coronary artery disease   . GERD (gastroesophageal reflux disease)   . Depression      Diet Order:  Diet heart healthy/carb modified Room service appropriate?: Yes; Fluid consistency:: Thin    Current Nutrition: Pt reports eating a very good breakfast this am including eggs and home fries; 100% of meal eaten per I/O chart  Food/Nutrition-Related History: Pt reports good appetite PTA eating 3-4 meals per day. Pt also reports she does not like to eat after 7pm.   Scheduled Medications:  . aspirin EC  81 mg Oral Daily  . atorvastatin  40 mg Oral Daily  . buPROPion  150 mg Oral Daily  . cefTRIAXone (ROCEPHIN)  IV  1 g Intravenous Q24H  . citalopram  40 mg Oral Daily  . clopidogrel  75 mg Oral Daily  . ergocalciferol  50,000 Units Oral Q14 Days  . famotidine  20 mg Oral BID  . heparin  5,000 Units Subcutaneous 3 times per day  . insulin aspart  0-5 Units Subcutaneous QHS  . insulin aspart  0-9 Units Subcutaneous TID WC  . insulin glargine  50 Units Subcutaneous Daily  . pantoprazole  40 mg Oral BID  . pioglitazone  15 mg Oral Daily  . pregabalin  75 mg Oral TID  . sucralfate  1 g Oral QID    Continuous Medications:  . sodium chloride 125 mL/hr at 10/19/15 2138     Electrolyte/Renal Profile and  Glucose Profile:   Recent Labs Lab 10/19/15 1721 10/20/15 0530  NA 129* 134*  K 4.6 3.9  CL 93* 105  CO2 23 23  BUN 61* 52*  CREATININE 4.36* 2.91*  CALCIUM 8.9 8.4*  GLUCOSE 382* 231*   Protein Profile: No results for input(s): ALBUMIN in the last 168 hours.  Gastrointestinal Profile: Last BM:  10/18/2015   Weight Change: Pt reports having n/v over a month ago, when EGD occurred and that her weight dropped some then. Pt reports weight of a little over 230lbs over a month ago. Per [facility] weight of 228lbs documented one month ago. RD measured weight of 230.6lbs on visit today.   Height:   Ht Readings from Last 1 Encounters:  10/20/15 5\' 9"  (1.753 m)    Weight:   Wt Readings from Last 1 Encounters:  10/20/15 230 lb 9.6 oz (104.599 kg)    Wt Readings from Last 10 Encounters:  10/20/15 230 lb 9.6 oz (104.599 kg)  09/20/15 228 lb 8 oz (103.647 kg)     BMI:  Body mass index is 34.04 kg/(m^2).   EDUCATION NEEDS:   No education needs identified at this time   LOW Care Level  Leda QuailAllyson Nilah Belcourt, RD, LDN Pager [phone] Weekend/On-Call Pager [phone]

## 2015-10-20 NOTE — Evaluation (Signed)
Physical Therapy Evaluation Patient Details Name: [name] Munoz MRN: [id number] DOB: [birth year] Today's Date: 10/20/2015   History of Present Illness  Pt is a 69 y.o. female s/p fall backwards after closing door.  Pt admitted with hypotension and acute renal failure.  Clinical Impression  Prior to admission, pt was independent ambulating with rollator.  Pt lives alone in 1 level apartment with ramp and elevator access.  [facility].  Currently pt is CGA with transfers and CGA to min assist with ambulation with RW (initial min assist d/t unsteadiness but improved to CGA and able to ambulate 2 laps around nursing station).  Pt would benefit from skilled PT to address noted impairments and functional limitations.  Recommend pt discharge to home when medically appropriate; no further PT needs anticipated with continued mobility during hospital stay.     Follow Up Recommendations No PT follow up    Equipment Recommendations       Recommendations for Other Services       Precautions / Restrictions Precautions Precautions: Fall Restrictions Weight Bearing Restrictions: No      Mobility  Bed Mobility Overal bed mobility: Modified Independent             General bed mobility comments: Supine to sit with HOB elevated  Transfers Overall transfer level: Needs assistance Equipment used: Rolling walker (2 wheeled) Transfers: Sit to/from Stand           General transfer comment: strong stand; steady without loss of balance  Ambulation/Gait Ambulation/Gait assistance: Min guard;Min assist Ambulation Distance (Feet): 360 Feet Assistive device: Rolling walker (2 wheeled)   Gait velocity: mild decrease   General Gait Details: pt initially mildly unsteady (mild lateral loss of balance B) requiring min assist to steady but after 30 feet pt appearing steady without loss of balance rest of ambulation distance  Stairs            Wheelchair Mobility     Modified Rankin (Stroke Patients Only)       Balance Overall balance assessment: Needs assistance Sitting-balance support: Bilateral upper extremity supported;Feet supported Sitting balance-Leahy Scale: Normal     Standing balance support: Bilateral upper extremity supported (on RW) Standing balance-Leahy Scale: Good Standing balance comment: static                             Pertinent Vitals/Pain Pain Assessment: No/denies pain  Vitals stable and WFL throughout treatment session.    Home Living Family/patient expects to be discharged to:: Private residence Living Arrangements: Alone   Type of Home: Apartment Home Access: Ramped entrance;Elevator     Home Layout: One level Home Equipment: Walker - 4 wheels (Raised commode)      Prior Function Level of Independence: Independent with assistive device(s)         Comments: Pt independent with rollator.  [facility] to [facility] M, T, F.     Hand Dominance        Extremity/Trunk Assessment   Upper Extremity Assessment: Overall WFL for tasks assessed           Lower Extremity Assessment: Overall WFL for tasks assessed         Communication      Cognition Arousal/Alertness: Awake/alert Behavior During Therapy: [facility] for tasks assessed/performed Overall Cognitive Status: Within Functional Limits for tasks assessed (pt reports memory loss since fall November 2015 but oriented x4)  General Comments   Nursing cleared pt for participation in physical therapy.  Pt agreeable to PT session.    Exercises   Performed semi-supine B LE therapeutic exercise x 10 reps:  Ankle pumps (AROM B LE's); SAQ's (AROM R; AROM L); heelslides (AROM R; AROM L), hip abd/adduction (AROM R; AROM L).  Pt required occasional vc's and tactile cues for correct technique with exercises.       Assessment/Plan    PT Assessment Patient needs continued PT services  PT Diagnosis Difficulty  walking   PT Problem List Decreased balance;Decreased mobility  PT Treatment Interventions DME instruction;Gait training;Functional mobility training;Therapeutic exercise;Therapeutic activities;Balance training;Patient/family education   PT Goals (Current goals can be found in the Care Plan section) Acute Rehab PT Goals Patient Stated Goal: to go home PT Goal Formulation: With patient Time For Goal Achievement: 11/03/15 Potential to Achieve Goals: Good    Frequency Min 2X/week   Barriers to discharge        Co-evaluation               End of Session Equipment Utilized During Treatment: Gait belt Activity Tolerance: Patient tolerated treatment well Patient left: in chair;with call bell/phone within reach;with chair alarm set;with nursing/sitter in room Nurse Communication: Mobility status         Time: [id number] PT Time Calculation (min) (ACUTE ONLY): 29 min   Charges:   PT Evaluation $Initial PT Evaluation Tier I: 1 Procedure PT Treatments $Therapeutic Exercise: 8-22 mins   PT G CodesHendricks Limes:        Huan Pollok 10/20/2015, 2:20 PM Hendricks LimesEmily Johnaton Sonneborn, PT [phone]

## 2015-10-21 DIAGNOSIS — N390 Urinary tract infection, site not specified: Secondary | ICD-10-CM

## 2015-10-21 LAB — GLUCOSE, CAPILLARY
GLUCOSE-CAPILLARY: 110 mg/dL — AB (ref 65–99)
GLUCOSE-CAPILLARY: 287 mg/dL — AB (ref 65–99)

## 2015-10-21 LAB — URINE CULTURE

## 2015-10-21 LAB — BASIC METABOLIC PANEL
ANION GAP: 3 — AB (ref 5–15)
BUN: 31 mg/dL — AB (ref 6–20)
CALCIUM: 7.8 mg/dL — AB (ref 8.9–10.3)
CO2: 22 mmol/L (ref 22–32)
Chloride: 109 mmol/L (ref 101–111)
Creatinine, Ser: 1.62 mg/dL — ABNORMAL HIGH (ref 0.44–1.00)
GFR calc Af Amer: 37 mL/min — ABNORMAL LOW (ref >60)
GFR, EST NON AFRICAN AMERICAN: 32 mL/min — AB (ref >60)
GLUCOSE: 119 mg/dL — AB (ref 65–99)
POTASSIUM: 3.8 mmol/L (ref 3.5–5.1)
SODIUM: 134 mmol/L — AB (ref 135–145)

## 2015-10-21 LAB — MICROALBUMIN / CREATININE URINE RATIO
Creatinine, Urine: 57.6 mg/dL
MICROALB UR: 44.2 ug/mL — AB
MICROALB/CREAT RATIO: 76.7 mg/g{creat} — AB (ref 0.0–30.0)

## 2015-10-21 LAB — CBC
HCT: 33.6 % — ABNORMAL LOW (ref 35.0–47.0)
Hemoglobin: 10.9 g/dL — ABNORMAL LOW (ref 12.0–16.0)
MCH: 28.8 pg (ref 26.0–34.0)
MCHC: 32.6 g/dL (ref 32.0–36.0)
MCV: 88.4 fL (ref 80.0–100.0)
PLATELETS: 141 10*3/uL — AB (ref 150–440)
RBC: 3.8 MIL/uL (ref 3.80–5.20)
RDW: 13.3 % (ref 11.5–14.5)
WBC: 8.6 10*3/uL (ref 3.6–11.0)

## 2015-10-21 MED ORDER — CEPHALEXIN 500 MG PO CAPS: 500.0000 mg | ORAL_CAPSULE | Freq: Three times a day (TID) | ORAL | Status: DC

## 2015-10-21 NOTE — Discharge Summary (Signed)
[facility] [place] [facility]  DISCHARGE SUMMARY   PATIENT NAME: [name] NeatSusan Munoz    MR#:  [id number]  DATE OF BIRTH:  Aug 26, 1946  DATE OF ADMISSION:  10/19/2015 ADMITTING PHYSICIAN: Auburn BilberryShreyang Patel, MD  DATE OF DISCHARGE: 10/21/2015  PRIMARY CARE PHYSICIAN: Delton PrairieBROOKS, KEATAH B, FNP    ADMISSION DIAGNOSIS:  Dehydration [E86.0] Hyperglycemia [R73.9] Acute renal failure (ARF) (HCC) [N17.9] Acute renal failure, unspecified acute renal failure type (HCC) [N17.9]  DISCHARGE DIAGNOSIS:  Active Problems:   Acute renal failure (ARF) (HCC)   UTI (urinary tract infection)   SECONDARY DIAGNOSIS:   Past Medical History  Diagnosis Date  . Diabetes mellitus without complication (HCC)   . Hypertension   . Coronary artery disease   . GERD (gastroesophageal reflux disease)   . Depression     HOSPITAL COURSE:   Patient is a 69 year old white female presenting with a fall and noted to have hypotension by EMS and acute renal failure  1. Acute renal failure: Resolved with IV fluids.  Baseline Cr around 1.5, came in with Cr 4.3 now back to 1.6.  Seen by nephrology during hospitalization. Likely due to ATN, hypovolemia. She reports hyperglycemia and fairly low oral intake.  2. Urinary tract infection:  Urine and blood cultures are pending. Treated with Rocephin. Will discharge on Keflex and follow cultures.  She report no dysuria or frequency. Is incontinent. Will need to monitor closely for recurrent UTI's.  3. Coronary artery disease : No chest pain or shortness of breath. Continue aspirin and Plavix  4. Hyperlipidemia continue Lipitor  5. GERD continue PPI   6. Diabetes type 2: A1c 10.9, poor outpatient control.  This will contribute to renal injury if not improved. Will continue lantus and actose. Will need to follow up with PCP.  7. Hypertension: BP has been low inpatient. Holding lisinopril, HCTZ and metoprolol. Recheck BP on Friday and assess for  resumption of BP meds.  8. Fall: due to hypotension. Physical therapy with no new recommendations.  DISCHARGE CONDITIONS:   stable  CONSULTS OBTAINED:  Treatment Team:  Auburn BilberryShreyang Patel, MD Mosetta PigeonHarmeet Singh, MD  DRUG ALLERGIES:  No Known Allergies  DISCHARGE MEDICATIONS:   Current Discharge Medication List    START taking these medications   Details  cephALEXin (KEFLEX) 500 MG capsule Take 1 capsule (500 mg total) by mouth 3 (three) times daily. Qty: 15 capsule, Refills: 0      CONTINUE these medications which have NOT CHANGED   Details  aspirin EC 81 MG tablet Take 81 mg by mouth daily.    atorvastatin (LIPITOR) 40 MG tablet Take 40 mg by mouth daily.    buPROPion (WELLBUTRIN XL) 150 MG 24 hr tablet Take 150 mg by mouth daily.    citalopram (CELEXA) 40 MG tablet Take 40 mg by mouth daily.    clopidogrel (PLAVIX) 75 MG tablet Take 75 mg by mouth daily.    ergocalciferol (VITAMIN D2) 50000 UNITS capsule Take 50,000 Units by mouth every 14 (fourteen) days. On the first and third Monday of each month    insulin glargine (LANTUS) 100 UNIT/ML injection Inject 50 Units into the skin daily.    pantoprazole (PROTONIX) 40 MG tablet Take 1 tablet (40 mg total) by mouth 2 (two) times daily. Qty: 60 tablet, Refills: 1    pioglitazone (ACTOS) 15 MG tablet Take 15 mg by mouth daily.    polyethylene glycol (MIRALAX / GLYCOLAX) packet Take 17 g by mouth daily as needed for  mild constipation.    pregabalin (LYRICA) 75 MG capsule Take 75 mg by mouth 3 (three) times daily.    ranitidine (ZANTAC) 150 MG tablet Take 150 mg by mouth 2 (two) times daily.    sucralfate (CARAFATE) 1 GM/10ML suspension Take 10 mLs (1 g total) by mouth 4 (four) times daily. Qty: 420 mL, Refills: 1      STOP taking these medications     lisinopril-hydrochlorothiazide (PRINZIDE,ZESTORETIC) 20-25 MG tablet      metoprolol succinate (TOPROL-XL) 25 MG 24 hr tablet      ondansetron (ZOFRAN) 4 MG tablet           DISCHARGE INSTRUCTIONS:    Stable. Carb modified, heart healthy diet. No home health needs. HOLD BP MEDS. Recheck BP and BMP on Friday 12/24.  If you experience worsening of your admission symptoms, develop shortness of breath, life threatening emergency, suicidal or homicidal thoughts you must seek medical attention immediately by calling 911 or calling your MD immediately  if symptoms less severe.  You Must read complete instructions/literature along with all the possible adverse reactions/side effects for all the Medicines you take and that have been prescribed to you. Take any new Medicines after you have completely understood and accept all the possible adverse reactions/side effects.   Please note  You were cared for by a hospitalist during your hospital stay. If you have any questions about your discharge medications or the care you received while you were in the hospital after you are discharged, you can call the unit and asked to speak with the hospitalist on call if the hospitalist that took care of you is not available. Once you are discharged, your primary care physician will handle any further medical issues. Please note that NO REFILLS for any discharge medications will be authorized once you are discharged, as it is imperative that you return to your primary care physician (or establish a relationship with a primary care physician if you do not have one) for your aftercare needs so that they can reassess your need for medications and monitor your lab values.    Today   CHIEF COMPLAINT:   Chief Complaint  Patient presents with  . Fall    HISTORY OF PRESENT ILLNESS:  [name] Munoz is a 69 y.o. female with a known history of diabetes type 2, hypertension, coronary artery disease who is brought to the ED after a fall. Patient reports that she started feeling weak earlier around 4:00. And she had a visitor that came to her house when they were leaving she closed the door.  Subsequently she fell backwards on the floor. When EMS arrived they noticed her blood pressure was in the 80s. She was brought to the emergency room and was noted to have an acute renal failure her creatinine was normal last month. She is denying any diarrhea or vomiting. She denies any chest pain or shortness of breath denies any urinary symptoms.  VITAL SIGNS:  Blood pressure 134/53, pulse 89, temperature 98.9 F (37.2 C), temperature source Oral, resp. rate 14, height  (1.753 m), weight 104.599 kg (230 lb 9.6 oz), SpO2 94 %.  I/O:   Intake/Output Summary (Last 24 hours) at 10/21/15 1044 Last data filed at 10/21/15 0929  Gross per 24 hour  Intake    480 ml  Output   1300 ml  Net   -820 ml    PHYSICAL EXAMINATION:  GENERAL:  69 y.o.-year-old patient lying in the bed with no acute  distress.  LUNGS: Normal breath sounds bilaterally, no wheezing, rales,rhonchi or crepitation. No use of accessory muscles of respiration.  CARDIOVASCULAR: S1, S2 normal. No murmurs, rubs, or gallops.  ABDOMEN: Soft, non-tender, non-distended. Bowel sounds present. No organomegaly or mass.  EXTREMITIES: No pedal edema, cyanosis, or clubbing.  NEUROLOGIC: Cranial nerves II through XII are intact. Muscle strength 5/5 in all extremities. Sensation intact. Gait not checked.  PSYCHIATRIC: The patient is alert and oriented x 3.  SKIN: No obvious rash, lesion, or ulcer.   DATA REVIEW:   CBC  Recent Labs Lab 10/21/15 0634  WBC 8.6  HGB 10.9*  HCT 33.6*  PLT 141*    Chemistries   Recent Labs Lab 10/21/15 0634  NA 134*  K 3.8  CL 109  CO2 22  GLUCOSE 119*  BUN 31*  CREATININE 1.62*  CALCIUM 7.8*    Cardiac Enzymes No results for input(s): TROPONINI in the last 168 hours.  Microbiology Results  Results for orders placed or performed during the hospital encounter of 10/19/15  Culture, blood (Routine X 2) w Reflex to ID Panel     Status: None (Preliminary result)   Collection Time:  10/20/15  9:36 AM  Result Value Ref Range Status   Specimen Description BLOOD RIGHT ASSIST CONTROL  Final   Special Requests BOTTLES DRAWN AEROBIC AND ANAEROBIC  11CC  Final   Culture NO GROWTH <12 HOURS  Final   Report Status PENDING  Incomplete    RADIOLOGY:  US Renal  10/20/2015  CLINICAL DATA:  Acute renal failure. EXAM: RENAL / URINARY TRACT ULTRASOUND COMPLETE COMPARISON:  CT 09/18/2015 FINDINGS: Right Kidney: Length: 10.1 cm. Echogenicity within normal limits. No mass or hydronephrosis visualized. Left Kidney: Length: 11.7 cm. Normal echogenicity without hydronephrosis. Ovoid shaped hypoechoic structure in the upper pole with acoustic enhancement. Findings are compatible with a cyst and it measures up to 3.9 cm. Hypoechoic structure in the left interpolar region measures up to 2.4 cm with acoustic enhancement. This is also suggestive for a cyst. Hypoechoic structure in the left kidney lower pole measures up to 2.4 cm without vascular flow. This is also compatible with a cyst. Bladder: Appears normal for degree of bladder distention. IMPRESSION: Negative for hydronephrosis. Left renal cysts. Electronically Signed   By: Richarda Overlie M.D.   On: 10/20/2015 09:35    EKG:   Orders placed or performed during the hospital encounter of 10/19/15  . ED EKG  . ED EKG  . EKG 12-Lead  . EKG 12-Lead      Management plans discussed with the patient, family and they are in agreement.  CODE STATUS:     Code Status Orders        Start     Ordered   10/19/15 2042  Full code   Continuous     10/19/15 2041      TOTAL TIME TAKING CARE OF THIS PATIENT: 35 minutes.  Greater than 50% of time spent in care coordination and counseling. Care plan discussed with her PCP.  Elby Showers M.D on 10/21/2015 at 10:44 AM  Between 7am to 6pm - Pager - [phone]  After 6pm go to [email or web address] - password [email or web address] [facility]  [place] [facility]  Office  [phone]  CC: Primary care  physician; Delton Prairie, FNP

## 2015-10-21 NOTE — Plan of Care (Addendum)
No c/o pain. VSS, afebrile. Possible d/c 2 days per Dr. Claris CheWalsh's progress note.   Problem: Safety: Goal: Ability to remain free from injury will improve High fall risk due to generalized weakness & fall at home yesterday. Bed alarm on, hourly rounding. Safe environment provided. Pt understands how to use call system for assistance.   Problem: Skin Integrity: Goal: Risk for impaired skin integrity will decrease Outcome: Progressing Sacrum excoriated due to incontinence at times. Skin care provided. Pt able to turn independently.  Problem: Fluid Volume: Goal: Ability to maintain a balanced intake and output will improve Outcome: Progressing IVF infusing as ordered. BUN (52) & Creatinine (2.91) improving from yesterday. Will be rechecked this AM. Nephrology following. Intake/output being measured closely.

## 2015-10-21 NOTE — Progress Notes (Signed)
Subjective:   Doing much better today BP control is acceptable S Cr has improved  Objective:  Vital signs in last 24 hours:  Temp:  [98.7 F (37.1 C)-99.3 F (37.4 C)] 98.9 F (37.2 C) (12/22 0508) Pulse Rate:  [81-89] 89 (12/22 0508) Resp:  [8-20] 14 (12/22 0510) BP: (124-134)/(45-54) 134/53 mmHg (12/22 0508) SpO2:  [94 %-99 %] 94 % (12/22 0508) Weight:  [104.599 kg (230 lb 9.6 oz)] 104.599 kg (230 lb 9.6 oz) (12/21 1100)  Weight change: 32.024 kg (70 lb 9.6 oz) Filed Weights   10/19/15 1722 10/20/15 1100  Weight: 72.576 kg (160 lb) 104.599 kg (230 lb 9.6 oz)    Intake/Output:    Intake/Output Summary (Last 24 hours) at 10/21/15 0856 Last data filed at 10/21/15 1308  Gross per 24 hour  Intake    600 ml  Output   1000 ml  Net   -400 ml     Physical Exam: General: Well appearing sitting in chair  HEENT Moist oral mucus membranes  Neck supple  Pulm/lungs Clear ant/lat  CVS/Heart Regular, no rub  Abdomen:  Soft, NT, ND  Extremities: Trace edema  Neurologic: Alert, oriented  Skin: No acute rashes  Access:        Basic Metabolic Panel:   Recent Labs Lab 10/19/15 1721 10/20/15 0530 10/21/15 0634  NA 129* 134* 134*  K 4.6 3.9 3.8  CL 93* 105 109  CO2 GLUCOSE 382* 231* 119*  BUN 61* 52* 31*  CREATININE 4.36* 2.91* 1.62*  CALCIUM 8.9 8.4* 7.8*     CBC:  Recent Labs Lab 10/19/15 1721 10/20/15 0530 10/21/15 0634  WBC 14.6* 12.1* 8.6  NEUTROABS 11.5*  --   --   HGB 12.9 12.2 10.9*  HCT 40.2 38.4 33.6*  MCV 88.7 88.9 88.4  PLT 200 167 141*      Microbiology:  Recent Results (from the past 720 hour(s))  Culture, blood (Routine X 2) w Reflex to ID Panel     Status: None (Preliminary result)   Collection Time: 10/20/15  9:36 AM  Result Value Ref Range Status   Specimen Description BLOOD RIGHT ASSIST CONTROL  Final   Special Requests BOTTLES DRAWN AEROBIC AND ANAEROBIC  11CC  Final   Culture NO GROWTH <12 HOURS  Final   Report  Status PENDING  Incomplete    Coagulation Studies: No results for input(s): LABPROT, INR in the last 72 hours.  Urinalysis:  Recent Labs  10/19/15 2053  COLORURINE YELLOW*  LABSPEC 1.009  PHURINE 5.0  GLUCOSEU 50*  HGBUR 1+*  BILIRUBINUR NEGATIVE  KETONESUR NEGATIVE  PROTEINUR NEGATIVE  NITRITE NEGATIVE  LEUKOCYTESUR 3+*      Imaging: US Renal  10/20/2015  CLINICAL DATA:  Acute renal failure. EXAM: RENAL / URINARY TRACT ULTRASOUND COMPLETE COMPARISON:  CT 09/18/2015 FINDINGS: Right Kidney: Length: 10.1 cm. Echogenicity within normal limits. No mass or hydronephrosis visualized. Left Kidney: Length: 11.7 cm. Normal echogenicity without hydronephrosis. Ovoid shaped hypoechoic structure in the upper pole with acoustic enhancement. Findings are compatible with a cyst and it measures up to 3.9 cm. Hypoechoic structure in the left interpolar region measures up to 2.4 cm with acoustic enhancement. This is also suggestive for a cyst. Hypoechoic structure in the left kidney lower pole measures up to 2.4 cm without vascular flow. This is also compatible with a cyst. Bladder: Appears normal for degree of bladder distention. IMPRESSION: Negative for hydronephrosis. Left renal cysts. Electronically Signed  By: Richarda OverlieAdam  Henn M.D.   On: 10/20/2015 09:35     Medications:   . sodium chloride 125 mL/hr at 10/21/15 0538   . aspirin EC  81 mg Oral Daily  . atorvastatin  40 mg Oral Daily  . buPROPion  150 mg Oral Daily  . cefTRIAXone (ROCEPHIN)  IV  1 g Intravenous Q24H  . citalopram  40 mg Oral Daily  . clopidogrel  75 mg Oral Daily  . ergocalciferol  50,000 Units Oral Q14 Days  . famotidine  20 mg Oral Daily  . heparin  5,000 Units Subcutaneous 3 times per day  . insulin aspart  0-5 Units Subcutaneous QHS  . insulin aspart  0-9 Units Subcutaneous TID WC  . insulin glargine  50 Units Subcutaneous Daily  . pantoprazole  40 mg Oral BID  . pioglitazone  15 mg Oral Daily  . pregabalin  75 mg  Oral Daily  . sucralfate  1 g Oral QID   acetaminophen **OR** acetaminophen, HYDROcodone-acetaminophen, polyethylene glycol  Assessment/ Plan:  69 y.o. female with a PMHX of long-standing diabetes for over 25 years, coronary disease, hypertension, GERD, was admitted on 10/19/2015 status post fall at home and is found to have acute renal failure  1. Acute renal failure  - Baseline creatinine of 1.0 on November 21 - likely combination of volume depletion and ATN from hypotension. Patient's home medications include ACE inhibitor, diuretic and metoprolol - Agree with holding all blood pressure medications - Agree with IV hydration - S Cr is  improving Results for [name], [name] Munoz (MRN [id number]) as of 10/21/2015 08:57  Ref. Range 09/20/2015 06:29 10/19/2015 17:21 10/20/2015 05:30 10/21/2015 06:34  Creatinine Latest Ref Range: 0.44-1.00 mg/dL 4.091.00 8.114.36 (H) 9.142.91 (H) 1.62 (H)   2. Urinary tract infection - Send urine for culture - Treated with antibiotics accordingly  3. Will obtain urine microalbumin/creatinine ratio  4. DM-2  - HbA1c 10.9 %  5. HTN - BP is controlled without any BP meds - Re-evaluate as outpatient to assess continued need for BP meds  F/u outpatient Ok to d/c home from renal standpoint     LOS: 2 [name] Munoz Munoz 12/22/20168:56 AM

## 2015-10-21 NOTE — Care Management (Signed)
Discharge to home today per Dr Clent RidgesWalsh. [facility] will transport. Gwenette GreetBrenda S Keynan Heffern RN MSN CCM Care Management [phone]

## 2015-10-21 NOTE — Progress Notes (Signed)
Inpatient Diabetes Program Recommendations  AACE/ADA: New Consensus Statement on Inpatient Glycemic Control (2015)  Target Ranges:  Prepandial:   less than 140 mg/dL      Peak postprandial:   less than 180 mg/dL (1-2 hours)      Critically ill patients:  140 - 180 mg/dL   Review of Glycemic Control  Results for [name], [name] (MRN [id number]) as of 10/21/2015 10:02  Ref. Range 10/20/2015 08:01 10/20/2015 11:12 10/20/2015 16:24 10/20/2015 21:39 10/21/2015 07:27  Glucose-Capillary Latest Ref Range: 65-99 mg/dL 409214 (H) 811252 (H) 914196 (H) 179 (H) 110 (H)   Diabetes history: Type 2, A1C 10.9% on 09/18/15 Outpatient Diabetes medications: Lantus 50 units qhs, Actos 15 mg qday Current orders for Inpatient glycemic control: Lantus 50 units qhs, Actos 15 mg qday, Novolog 0-9 units tid with meals, Novolog 0-5 units qhs  Inpatient Diabetes Program Recommendations: Agree with current orders for diabetes medication management.   Spoke with patient today- she admits to forgetting her Lantus insulin at times. She reports being more forgetful since a fall last November- I have requested she discuss missing her insulin with the MD- there may be a better time of the day to take the insulin so she will be more successful in taking it most days.  She has used insulin pens in the past- she currently is using a syringe.  Because of the ease of the pen, she would likely be more successful using the pen if it is covered by her insurance.  She is aware that her A1C is elevated and is working with her MD to get it down. She missed an appointment with the doctor on Tuesday and needs to reschedule it.     I have asked her to check blood sugars before each meal and at hs so MD can assess BG control.  If mealtime insulin is ordered for this patient at discharge, consider ordering a set amount since she does report confusion and may have difficulty with higher level processing.  Patient lives alone.   Susette RacerJulie Elida Harbin, RN, BA,  MHA, CDE Diabetes Coordinator Inpatient Diabetes Program  [phone] (Team Pager) [phone] [facility] Office) 10/21/2015 10:11 AM

## 2015-10-21 NOTE — Care Management Important Message (Signed)
Important Message  Patient Details  Name: [name] Munoz MRN: [id number] Date of Birth: [birth year]   Medicare Important Message Given:  Yes    Olegario MessierKathy A Wilma Michaelson 10/21/2015, 10:19 AM

## 2015-10-21 NOTE — Progress Notes (Signed)
Patient discharge home per MD order. Prescription given to patient. All discharge instructions given and all questions answered. [facility] will transport home.

## 2015-10-21 NOTE — Discharge Instructions (Signed)
°  DIET:  °Cardiac diet, Diabetic diet and Low fat, Low cholesterol diet ° °DISCHARGE CONDITION:  °Fair ° °ACTIVITY:  °Activity as tolerated ° °OXYGEN:  °Home Oxygen: No. °  °Oxygen Delivery: room air ° °DISCHARGE LOCATION:  °home  ° °If you experience worsening of your admission symptoms, develop shortness of breath, life threatening emergency, suicidal or homicidal thoughts you must seek medical attention immediately by calling 911 or calling your MD immediately  if symptoms less severe. ° °You Must read complete instructions/literature along with all the possible adverse reactions/side effects for all the Medicines you take and that have been prescribed to you. Take any new Medicines after you have completely understood and accpet all the possible adverse reactions/side effects.  ° °Please note ° °You were cared for by a hospitalist during your hospital stay. If you have any questions about your discharge medications or the care you received while you were in the hospital after you are discharged, you can call the unit and asked to speak with the hospitalist on call if the hospitalist that took care of you is not available. Once you are discharged, your primary care physician will handle any further medical issues. Please note that NO REFILLS for any discharge medications will be authorized once you are discharged, as it is imperative that you return to your primary care physician (or establish a relationship with a primary care physician if you do not have one) for your aftercare needs so that they can reassess your need for medications and monitor your lab values. ° ° °

## 2015-10-25 LAB — CULTURE, BLOOD (ROUTINE X 2)
Culture: NO GROWTH
Culture: NO GROWTH

## 2017-04-04 ENCOUNTER — Emergency Department: Payer: Medicare (Managed Care)

## 2017-04-04 ENCOUNTER — Emergency Department
Admission: EM | Admit: 2017-04-04 | Discharge: 2017-04-04 | Disposition: A | Discharge status: Home / Self Care | Payer: Medicare (Managed Care) | Attending: Emergency Medicine | Admitting: Emergency Medicine

## 2017-04-04 DIAGNOSIS — I2510 Atherosclerotic heart disease of native coronary artery without angina pectoris: Secondary | ICD-10-CM | POA: Diagnosis not present

## 2017-04-04 DIAGNOSIS — S0990XA Unspecified injury of head, initial encounter: Secondary | ICD-10-CM | POA: Diagnosis not present

## 2017-04-04 DIAGNOSIS — S0101XA Laceration without foreign body of scalp, initial encounter: Secondary | ICD-10-CM | POA: Diagnosis not present

## 2017-04-04 DIAGNOSIS — Y998 Other external cause status: Secondary | ICD-10-CM | POA: Insufficient documentation

## 2017-04-04 DIAGNOSIS — W010XXA Fall on same level from slipping, tripping and stumbling without subsequent striking against object, initial encounter: Secondary | ICD-10-CM | POA: Insufficient documentation

## 2017-04-04 DIAGNOSIS — Z7982 Long term (current) use of aspirin: Secondary | ICD-10-CM | POA: Insufficient documentation

## 2017-04-04 DIAGNOSIS — E119 Type 2 diabetes mellitus without complications: Secondary | ICD-10-CM | POA: Insufficient documentation

## 2017-04-04 DIAGNOSIS — Y9389 Activity, other specified: Secondary | ICD-10-CM | POA: Diagnosis not present

## 2017-04-04 DIAGNOSIS — Z79899 Other long term (current) drug therapy: Secondary | ICD-10-CM | POA: Insufficient documentation

## 2017-04-04 DIAGNOSIS — Y929 Unspecified place or not applicable: Secondary | ICD-10-CM | POA: Insufficient documentation

## 2017-04-04 DIAGNOSIS — N390 Urinary tract infection, site not specified: Secondary | ICD-10-CM

## 2017-04-04 DIAGNOSIS — Z794 Long term (current) use of insulin: Secondary | ICD-10-CM | POA: Insufficient documentation

## 2017-04-04 DIAGNOSIS — R42 Dizziness and giddiness: Secondary | ICD-10-CM | POA: Diagnosis present

## 2017-04-04 DIAGNOSIS — Z7902 Long term (current) use of antithrombotics/antiplatelets: Secondary | ICD-10-CM | POA: Insufficient documentation

## 2017-04-04 LAB — URINALYSIS, COMPLETE (UACMP) WITH MICROSCOPIC
BILIRUBIN URINE: NEGATIVE
Glucose, UA: >=500 mg/dL — AB
HGB URINE DIPSTICK: NEGATIVE
Ketones, ur: NEGATIVE mg/dL
Nitrite: POSITIVE — AB
Protein, ur: NEGATIVE mg/dL
SPECIFIC GRAVITY, URINE: 1.01 (ref 1.005–1.030)
pH: 5 (ref 5.0–8.0)

## 2017-04-04 LAB — CBC WITH DIFFERENTIAL/PLATELET
BASOS PCT: 1 %
Basophils Absolute: 0 10*3/uL (ref 0–0.1)
EOS PCT: 4 %
Eosinophils Absolute: 0.2 10*3/uL (ref 0–0.7)
HCT: 41.3 % (ref 35.0–47.0)
Hemoglobin: 13.8 g/dL (ref 12.0–16.0)
Lymphocytes Relative: 36 %
Lymphs Abs: 2 10*3/uL (ref 1.0–3.6)
MCH: 29.4 pg (ref 26.0–34.0)
MCHC: 33.5 g/dL (ref 32.0–36.0)
MCV: 87.7 fL (ref 80.0–100.0)
MONO ABS: 0.5 10*3/uL (ref 0.2–0.9)
Monocytes Relative: 8 %
Neutro Abs: 2.9 10*3/uL (ref 1.4–6.5)
Neutrophils Relative %: 51 %
PLATELETS: 131 10*3/uL — AB (ref 150–440)
RBC: 4.71 MIL/uL (ref 3.80–5.20)
RDW: 13.4 % (ref 11.5–14.5)
WBC: 5.6 10*3/uL (ref 3.6–11.0)

## 2017-04-04 LAB — COMPREHENSIVE METABOLIC PANEL
ALBUMIN: 3.8 g/dL (ref 3.5–5.0)
ALT: 14 U/L (ref 14–54)
ANION GAP: 9 (ref 5–15)
AST: 22 U/L (ref 15–41)
Alkaline Phosphatase: 101 U/L (ref 38–126)
BUN: 20 mg/dL (ref 6–20)
CHLORIDE: 103 mmol/L (ref 101–111)
CO2: 26 mmol/L (ref 22–32)
Calcium: 9.2 mg/dL (ref 8.9–10.3)
Creatinine, Ser: 1.22 mg/dL — ABNORMAL HIGH (ref 0.44–1.00)
GFR calc Af Amer: 51 mL/min — ABNORMAL LOW (ref >60)
GFR calc non Af Amer: 44 mL/min — ABNORMAL LOW (ref >60)
GLUCOSE: 320 mg/dL — AB (ref 65–99)
POTASSIUM: 4.4 mmol/L (ref 3.5–5.1)
SODIUM: 138 mmol/L (ref 135–145)
TOTAL PROTEIN: 7 g/dL (ref 6.5–8.1)
Total Bilirubin: 0.9 mg/dL (ref 0.3–1.2)

## 2017-04-04 LAB — PROTIME-INR
INR: 0.99
Prothrombin Time: 13.1 seconds (ref 11.4–15.2)

## 2017-04-04 LAB — TROPONIN I

## 2017-04-04 MED ORDER — CEPHALEXIN 500 MG PO CAPS: 500.0000 mg | ORAL_CAPSULE | Freq: Three times a day (TID) | ORAL | 0 refills | Status: AC

## 2017-04-04 MED ORDER — SODIUM CHLORIDE 0.9 % IV BOLUS (SEPSIS)
500.0000 mL | Freq: Once | INTRAVENOUS | Status: AC
  Administered 2017-04-04: 500 mL via INTRAVENOUS

## 2017-04-04 MED ORDER — LIDOCAINE-EPINEPHRINE (PF) 2 %-1:200000 IJ SOLN
10.0000 mL | Freq: Once | INTRAMUSCULAR | Status: AC
  Administered 2017-04-04: 10 mL
  Filled 2017-04-04 (×2): qty 10

## 2017-04-04 MED ORDER — DEXTROSE 5 % IV SOLN
1.0000 g | Freq: Once | INTRAVENOUS | Status: AC
  Administered 2017-04-04: 1 g via INTRAVENOUS
  Filled 2017-04-04: qty 10

## 2017-04-04 NOTE — ED Notes (Signed)
Pt states she has hx of "being off balance" and per pt when she gets up she takes a couple seconds to make sure she feels ok then takes her steps. Pt reports doing that this am however states she did become dizzy and fell.

## 2017-04-04 NOTE — ED Provider Notes (Addendum)
[facility] Emergency Department Provider Note  ____________________________________________   I have reviewed the triage vital signs and the nursing notes.   HISTORY  Chief Complaint Dizziness and Fall    HPI [name] Munoz is a 71 y.o. female who states that for years when she stands up in the morning she has to "equilibrate" her pressure because she gets lightheaded. Herdoctors are aware and she's been having workup for this as an outpatient. She states that she is in her normal state of health and she stood up too quickly this morning and felt lightheaded and fell. She did not pass out she had no chest pain or shortness of breath. This is a normal lightheadedness that she feels but she states that she may labs sufficient time to correct for her normal lightheadedness.     Past Medical History:  Diagnosis Date  . Coronary artery disease   . Depression   . Diabetes mellitus without complication (HCC)   . GERD (gastroesophageal reflux disease)   . Hypertension     Patient Active Problem List   Diagnosis Date Noted  . UTI (urinary tract infection) 10/21/2015  . Acute renal failure (ARF) (HCC) 10/19/2015  . Intractable vomiting 09/18/2015    Past Surgical History:  Procedure Laterality Date  . ESOPHAGOGASTRODUODENOSCOPY N/A 09/20/2015   Procedure: ESOPHAGOGASTRODUODENOSCOPY (EGD);  Surgeon: Scot Jun, MD;  Location: [facility] ENDOSCOPY;  Service: Endoscopy;  Laterality: N/A;  . PERCUTANEOUS CORONARY ROTOBLATOR INTERVENTION (PCI-R)      Prior to Admission medications   Medication Sig Start Date End Date Taking? Authorizing Provider  aspirin EC 81 MG tablet Take 81 mg by mouth daily.    [provider]  atorvastatin (LIPITOR) 40 MG tablet Take 40 mg by mouth daily.    [provider]  buPROPion (WELLBUTRIN XL) 150 MG 24 hr tablet Take 150 mg by mouth daily.    [provider]  cephALEXin (KEFLEX) 500 MG capsule Take 1  capsule (500 mg total) by mouth 3 (three) times daily. 10/21/15   Gale Journey, MD  citalopram (CELEXA) 40 MG tablet Take 40 mg by mouth daily.    [provider]  clopidogrel (PLAVIX) 75 MG tablet Take 75 mg by mouth daily.    [provider]  ergocalciferol (VITAMIN D2) 50000 UNITS capsule Take 50,000 Units by mouth every 14 (fourteen) days. On the first and third Monday of each month    [provider]  insulin glargine (LANTUS) 100 UNIT/ML injection Inject 50 Units into the skin daily.    [provider]  pantoprazole (PROTONIX) 40 MG tablet Take 1 tablet (40 mg total) by mouth 2 (two) times daily. 09/18/15 09/17/16  [name], MD  pioglitazone (ACTOS) 15 MG tablet Take 15 mg by mouth daily.    [provider]  polyethylene glycol (MIRALAX / GLYCOLAX) packet Take 17 g by mouth daily as needed for mild constipation.    [provider]  pregabalin (LYRICA) 75 MG capsule Take 75 mg by mouth 3 (three) times daily.    [provider]  ranitidine (ZANTAC) 150 MG tablet Take 150 mg by mouth 2 (two) times daily.    [provider]  sucralfate (CARAFATE) 1 GM/10ML suspension Take 10 mLs (1 g total) by mouth 4 (four) times daily. 09/18/15 09/17/16  [name], MD    Allergies Patient has no known allergies.  Family History  Problem Relation Age of Onset  . Diabetes Mellitus II Maternal Aunt   .  Diabetes Mellitus I Other     Social History Social History  Substance Use Topics  . Smoking status: Never Smoker  . Smokeless tobacco: Never Used  . Alcohol use No    Review of Systems Constitutional: No fever/chills Eyes: No visual changes. ENT: No sore throat. No stiff neck no neck pain Cardiovascular: Denies chest pain. Respiratory: Denies shortness of breath. Gastrointestinal:   no vomiting.  No diarrhea.  No constipation. Genitourinary: Negative for dysuria. Musculoskeletal: Negative lower extremity  swelling Skin: Negative for rash. Neurological: Negative for severe headaches, focal weakness or numbness.   ____________________________________________   PHYSICAL EXAM:  VITAL SIGNS: ED Triage Vitals  Enc Vitals Group     BP 04/04/17 0659 (!) 139/105     Pulse Rate 04/04/17 0659 65     Resp 04/04/17 0659 18     Temp 04/04/17 0659 97.7 F (36.5 C)     Temp Source 04/04/17 0659 Oral     SpO2 04/04/17 0659 99 %     Weight 04/04/17 0705 234 lb (106.1 kg)     Height 04/04/17 0705 5\' 9"  (1.753 m)     Head Circumference --      Peak Flow --      Pain Score 04/04/17 0658 5     Pain Loc --      Pain Edu? --      Excl. in GC? --     Constitutional: Alert and oriented. Well appearing and in no acute distress. Eyes: Conjunctivae are normal Head: Possibly 7 cm lack noted to left parietal region. Bleeding controlled no skull fracture palpated HEENT: No congestion/rhinnorhea. Mucous membranes are moist.  Oropharynx non-erythematous Neck:   Nontender with no meningismus, no masses, no stridor Cardiovascular: Normal rate, regular rhythm. Grossly normal heart sounds.  Good peripheral circulation. Respiratory: Normal respiratory effort.  No retractions. Lungs CTAB. Abdominal: Soft and nontender. No distention. No guarding no rebound Back:  There is no focal tenderness or step off.  there is no midline tenderness there are no lesions noted. there is no CVA tenderness Musculoskeletal: No lower extremity tenderness, no upper extremity tenderness. No joint effusions, no DVT signs strong distal pulses no edema Neurologic:  Normal speech and language. No gross focal neurologic deficits are appreciated.  Skin:  Skin is warm, dry and intact. No rash noted. Psychiatric: Mood and affect are normal. Speech and behavior are normal.  ____________________________________________   LABS (all labs ordered are listed, but only abnormal results are displayed)  Labs Reviewed  CBC WITH  DIFFERENTIAL/PLATELET  TROPONIN I  COMPREHENSIVE METABOLIC PANEL  URINALYSIS, COMPLETE (UACMP) WITH MICROSCOPIC  PROTIME-INR   ____________________________________________  EKG  I personally interpreted any EKGs ordered by me or triage Normal sinus rhythm rate 66 no acute ST elevation or depression normal axis unremarkable EKG ____________________________________________  RADIOLOGY  I reviewed any imaging ordered by me or triage that were performed during my shift and, if possible, patient and/or family made aware of any abnormal findings. ____________________________________________   PROCEDURES  Procedure(s) performed: LACERATION REPAIR Performed by: Jeanmarie PlantJAMES A Toya Palacios Authorized by: Jeanmarie PlantJAMES A Burton Gahan Consent: Verbal consent obtained. Risks and benefits: risks, benefits and alternatives were discussed Consent given by: patient Patient identity confirmed: provided demographic data Prepped and Draped in normal sterile fashion Wound explored  Laceration Location: Left parietal  Laceration Length: 7 cm  No Foreign Bodies seen or palpated  Anesthesia: local infiltration  Local anesthetic: lidocaine 1 % with epinephrine  Anesthetic total: 5 ml  Irrigation  method: syringe Amount of cleaning: standard  Skin closure: Interrupted 4-0 Prolene   Number of sutures: 7   Technique: Interrupted (used stitches because initially there was a gap that would not allow staples, that was closed)   Patient tolerance: Patient tolerated the procedure well with no immediate complications.   Procedures  Critical Care performed: None  ____________________________________________   INITIAL IMPRESSION / ASSESSMENT AND PLAN / ED COURSE  Pertinent labs & imaging results that were available during my care of the patient were reviewed by me and considered in my medical decision making (see chart for details).  Patient with chronic recurrent lightheadedness in the morning so that  probably too quickly and had a non-syncopal fall. This is not unusual for her. She does not have any residual complaints aside from a head lack and she would not be here more for the headlock. I have her. The laceration we'll check to make sure that there is no other inciting causes and I will obtain a CT scan because she is on Plavix and we will reassess  ----------------------------------------- 11:32 AM on 04/04/2017 -----------------------------------------  Patient in no acute distress, not orthostatic, does have a UTI, states she does not usually have symptoms with them, we did offer admission she would prefer to go home. We will discharge her, I will give her Rocephin 2 coverage. We'll send her on Keflex. Understands that there is some limitation in terms of resistance in the urine culture is pending. She has no further complaints she is awake and alert imaging and workup is otherwise reassuring, we will have her follow up with her primary care doctor and she knows of sutures need to come out in 7 days and is up-to-date on tetanus return precautions and follow-up given and understood    ____________________________________________   FINAL CLINICAL IMPRESSION(S) / ED DIAGNOSES  Final diagnoses:  None      This chart was dictated using voice recognition software.  Despite best efforts to proofread,  errors can occur which can change meaning.      Jeanmarie Plant, MD 04/04/17 1610    Jeanmarie Plant, MD 04/04/17 1133    Jeanmarie Plant, MD 04/04/17 [phone]

## 2017-04-04 NOTE — ED Triage Notes (Signed)
Per EMS, pt reports getting up to go to the bathroom and became dizzy per pt. Pt denies LOC. Pt states she fell and hit her head on the night stand. EMS reports 2 inch lac to left head. Pt denies neck or back pain. Pt reports hx of diabetes. Pt A&O and in NAD at this time.

## 2017-04-06 LAB — URINE CULTURE: Culture: >=100000 — AB

## 2017-07-10 ENCOUNTER — Other Ambulatory Visit: Payer: Self-pay | Admitting: Family

## 2017-07-10 DIAGNOSIS — Z0000 Encounter for general adult medical examination without abnormal findings: Secondary | ICD-10-CM

## 2018-03-07 ENCOUNTER — Emergency Department
Admission: EM | Admit: 2018-03-07 | Discharge: 2018-03-08 | Disposition: A | Discharge status: Home / Self Care | Payer: Medicare (Managed Care) | Attending: Emergency Medicine | Admitting: Emergency Medicine

## 2018-03-07 ENCOUNTER — Other Ambulatory Visit: Payer: Self-pay

## 2018-03-07 ENCOUNTER — Encounter: Payer: Self-pay | Admitting: *Deleted

## 2018-03-07 DIAGNOSIS — Z7902 Long term (current) use of antithrombotics/antiplatelets: Secondary | ICD-10-CM | POA: Insufficient documentation

## 2018-03-07 DIAGNOSIS — I10 Essential (primary) hypertension: Secondary | ICD-10-CM | POA: Diagnosis not present

## 2018-03-07 DIAGNOSIS — R112 Nausea with vomiting, unspecified: Secondary | ICD-10-CM | POA: Insufficient documentation

## 2018-03-07 DIAGNOSIS — E119 Type 2 diabetes mellitus without complications: Secondary | ICD-10-CM | POA: Diagnosis not present

## 2018-03-07 DIAGNOSIS — Z7982 Long term (current) use of aspirin: Secondary | ICD-10-CM | POA: Insufficient documentation

## 2018-03-07 DIAGNOSIS — I2510 Atherosclerotic heart disease of native coronary artery without angina pectoris: Secondary | ICD-10-CM | POA: Insufficient documentation

## 2018-03-07 DIAGNOSIS — N390 Urinary tract infection, site not specified: Secondary | ICD-10-CM | POA: Diagnosis not present

## 2018-03-07 DIAGNOSIS — R109 Unspecified abdominal pain: Secondary | ICD-10-CM

## 2018-03-07 LAB — URINALYSIS, COMPLETE (UACMP) WITH MICROSCOPIC
BILIRUBIN URINE: NEGATIVE
Ketones, ur: 80 mg/dL — AB
NITRITE: NEGATIVE
PROTEIN: 30 mg/dL — AB
Specific Gravity, Urine: 1.023 (ref 1.005–1.030)
pH: 6 (ref 5.0–8.0)

## 2018-03-07 LAB — COMPREHENSIVE METABOLIC PANEL
ALBUMIN: 4.2 g/dL (ref 3.5–5.0)
ALT: 21 U/L (ref 14–54)
ANION GAP: 13 (ref 5–15)
AST: 30 U/L (ref 15–41)
Alkaline Phosphatase: 91 U/L (ref 38–126)
BUN: 18 mg/dL (ref 6–20)
CHLORIDE: 96 mmol/L — AB (ref 101–111)
CO2: 25 mmol/L (ref 22–32)
Calcium: 9.4 mg/dL (ref 8.9–10.3)
Creatinine, Ser: 1.11 mg/dL — ABNORMAL HIGH (ref 0.44–1.00)
GFR calc Af Amer: 57 mL/min — ABNORMAL LOW (ref >60)
GFR calc non Af Amer: 49 mL/min — ABNORMAL LOW (ref >60)
GLUCOSE: 427 mg/dL — AB (ref 65–99)
POTASSIUM: 4.7 mmol/L (ref 3.5–5.1)
SODIUM: 134 mmol/L — AB (ref 135–145)
Total Bilirubin: 2.4 mg/dL — ABNORMAL HIGH (ref 0.3–1.2)
Total Protein: 7.4 g/dL (ref 6.5–8.1)

## 2018-03-07 LAB — CBC
HEMATOCRIT: 44.1 % (ref 35.0–47.0)
HEMOGLOBIN: 14.8 g/dL (ref 12.0–16.0)
MCH: 30.2 pg (ref 26.0–34.0)
MCHC: 33.5 g/dL (ref 32.0–36.0)
MCV: 90.1 fL (ref 80.0–100.0)
Platelets: 130 10*3/uL — ABNORMAL LOW (ref 150–440)
RBC: 4.9 MIL/uL (ref 3.80–5.20)
RDW: 13.5 % (ref 11.5–14.5)
WBC: 7.2 10*3/uL (ref 3.6–11.0)

## 2018-03-07 LAB — LIPASE, BLOOD: LIPASE: 41 U/L (ref 11–51)

## 2018-03-07 MED ORDER — IOPAMIDOL (ISOVUE-300) INJECTION 61%
30.0000 mL | Freq: Once | INTRAVENOUS | Status: AC
  Administered 2018-03-07: 30 mL via ORAL

## 2018-03-07 MED ORDER — MORPHINE SULFATE (PF) 4 MG/ML IV SOLN
4.0000 mg | Freq: Once | INTRAVENOUS | Status: AC
Start: 2018-03-07 — End: 2018-03-07
  Administered 2018-03-07: 4 mg via INTRAVENOUS
  Filled 2018-03-07: qty 1

## 2018-03-07 MED ORDER — METOCLOPRAMIDE HCL 5 MG/ML IJ SOLN
10.0000 mg | Freq: Once | INTRAMUSCULAR | Status: AC
  Administered 2018-03-07: 10 mg via INTRAVENOUS
  Filled 2018-03-07: qty 2

## 2018-03-07 MED ORDER — SODIUM CHLORIDE 0.9 % IV BOLUS
500.0000 mL | Freq: Once | INTRAVENOUS | Status: AC
  Administered 2018-03-07: 500 mL via INTRAVENOUS

## 2018-03-07 MED ORDER — ONDANSETRON HCL 4 MG/2ML IJ SOLN
4.0000 mg | Freq: Once | INTRAMUSCULAR | Status: AC | PRN
  Administered 2018-03-07: 4 mg via INTRAVENOUS
  Filled 2018-03-07: qty 2

## 2018-03-07 NOTE — ED Triage Notes (Signed)
Per EMS pt has had abd pain on and off x 2 weeks and 2 days of N&V. Pt c/o R and L upper quad pain. She has a cough and states that is from allergies. Pt is Diabetic and glucose by EMS was 439. Pt lives alone.

## 2018-03-07 NOTE — ED Provider Notes (Signed)
[facility] Emergency Department Provider Note ____________________________________________   First MD Initiated Contact with Patient 03/07/18 2117     (approximate)  I have reviewed the triage vital signs and the nursing notes.   HISTORY  Chief Complaint Abdominal Pain    HPI [name] Munoz is a 72 y.o. female with PMH as noted below who presents with abdominal pain over the last 2 weeks, intermittent, worse in the last 2 days, primarily in the mid and upper abdomen worse on the left side but sometimes radiating to the right.  She states it is associated with nausea, several episodes of vomiting, and a sour taste in her mouth.  She denies associated diarrhea, and states she has been constipated for the last few days.  She denies fever or urinary symptoms.  No chest pain or difficulty breathing.  She reports that her glucose has been elevated because she has been trying to drink Sprite to settle her stomach.  Past Medical History:  Diagnosis Date  . Coronary artery disease   . Depression   . Diabetes mellitus without complication (HCC)   . GERD (gastroesophageal reflux disease)   . Hypertension     Patient Active Problem List   Diagnosis Date Noted  . UTI (urinary tract infection) 10/21/2015  . Acute renal failure (ARF) (HCC) 10/19/2015  . Intractable vomiting 09/18/2015    Past Surgical History:  Procedure Laterality Date  . ESOPHAGOGASTRODUODENOSCOPY N/A 09/20/2015   Procedure: ESOPHAGOGASTRODUODENOSCOPY (EGD);  Surgeon: Scot Jun, MD;  Location: [facility] ENDOSCOPY;  Service: Endoscopy;  Laterality: N/A;  . PERCUTANEOUS CORONARY ROTOBLATOR INTERVENTION (PCI-R)      Prior to Admission medications   Medication Sig Start Date End Date Taking? Authorizing Provider  aspirin EC 81 MG tablet Take 81 mg by mouth daily.    [provider]  atorvastatin (LIPITOR) 40 MG tablet Take 40 mg by mouth daily.    [provider]  buPROPion  (WELLBUTRIN XL) 150 MG 24 hr tablet Take 150 mg by mouth daily.    [provider]  citalopram (CELEXA) 40 MG tablet Take 40 mg by mouth daily.    [provider]  clopidogrel (PLAVIX) 75 MG tablet Take 75 mg by mouth daily.    [provider]  ergocalciferol (VITAMIN D2) 50000 UNITS capsule Take 50,000 Units by mouth every 14 (fourteen) days. On the first and third Monday of each month    [provider]  insulin glargine (LANTUS) 100 UNIT/ML injection Inject 30 Units into the skin 2 (two) times daily.     [provider]  pantoprazole (PROTONIX) 40 MG tablet Take 1 tablet (40 mg total) by mouth 2 (two) times daily. Patient not taking: Reported on 04/04/2017 09/18/15 09/17/16  [name], MD  pioglitazone (ACTOS) 15 MG tablet Take 15 mg by mouth daily.    [provider]  polyethylene glycol (MIRALAX / GLYCOLAX) packet Take 17 g by mouth daily as needed for mild constipation.    [provider]  pregabalin (LYRICA) 75 MG capsule Take 75 mg by mouth 3 (three) times daily.    [provider]  ranitidine (ZANTAC) 150 MG tablet Take 150 mg by mouth 2 (two) times daily as needed.     [provider]  sucralfate (CARAFATE) 1 GM/10ML suspension Take 10 mLs (1 g total) by mouth 4 (four) times daily. Patient not taking: Reported on 04/04/2017 09/18/15 09/17/16  [name], MD    Allergies Patient has no  known allergies.  Family History  Problem Relation Age of Onset  . Diabetes Mellitus II Maternal Aunt   . Diabetes Mellitus I Other     Social History Social History   Tobacco Use  . Smoking status: Never Smoker  . Smokeless tobacco: Never Used  Substance Use Topics  . Alcohol use: No    Alcohol/week: 0.0 oz  . Drug use: No    Review of Systems  Constitutional: Positive for chills. Eyes: No redness. ENT: No sore throat. Cardiovascular: Denies chest pain. Respiratory: Denies shortness of  breath. Gastrointestinal: Positive for nausea and vomiting.  Genitourinary: Negative for dysuria.  Musculoskeletal: Negative for back pain. Skin: Negative for rash. Neurological: Negative for headache.   ____________________________________________   PHYSICAL EXAM:  VITAL SIGNS: ED Triage Vitals  Enc Vitals Group     BP --      Pulse Rate 03/07/18 2049 93     Resp --      Temp --      Temp src --      SpO2 03/07/18 2046 96 %     Weight 03/07/18 2050 210 lb (95.3 kg)     Height 03/07/18 2050  (1.753 m)     Head Circumference --      Peak Flow --      Pain Score 03/07/18 2049 8     Pain Loc --      Pain Edu? --      Excl. in GC? --     Constitutional: Alert and oriented.  Uncomfortable appearing but in no acute distress. Eyes: Conjunctivae are normal.  No scleral icterus. Head: Atraumatic. Nose: No congestion/rhinnorhea. Mouth/Throat: Mucous membranes are slightly dry.   Neck: Normal range of motion.  Cardiovascular: Borderline tachycardic, regular rhythm. Grossly normal heart sounds.  Good peripheral circulation. Respiratory: Normal respiratory effort.  No retractions. Lungs CTAB. Gastrointestinal: Soft with mild bilateral upper quadrant and left mid abdominal tenderness. No distention.  Genitourinary: No flank tenderness. Musculoskeletal: No lower extremity edema.  Extremities warm and well perfused.  Neurologic:  Normal speech and language. No gross focal neurologic deficits are appreciated.  Skin:  Skin is warm and dry. No rash noted. Psychiatric: Mood and affect are normal. Speech and behavior are normal.  ____________________________________________   LABS (all labs ordered are listed, but only abnormal results are displayed)  Labs Reviewed  COMPREHENSIVE METABOLIC PANEL - Abnormal; Notable for the following components:      Result Value   Sodium 134 (*)    Chloride 96 (*)    Glucose, Bld 427 (*)    Creatinine, Ser 1.11 (*)    Total Bilirubin 2.4  (*)    GFR calc non Af Amer 49 (*)    GFR calc Af Amer 57 (*)    All other components within normal limits  CBC - Abnormal; Notable for the following components:   Platelets 130 (*)    All other components within normal limits  URINALYSIS, COMPLETE (UACMP) WITH MICROSCOPIC - Abnormal; Notable for the following components:   Color, Urine YELLOW (*)    APPearance HAZY (*)    Glucose, UA >=500 (*)    Hgb urine dipstick SMALL (*)    Ketones, ur 80 (*)    Protein, ur 30 (*)    Leukocytes, UA TRACE (*)    Bacteria, UA RARE (*)    All other components within normal limits  LIPASE, BLOOD   ____________________________________________  EKG   ____________________________________________  RADIOLOGY  CT abdomen: Pending  ____________________________________________   PROCEDURES  Procedure(s) performed: No  Procedures  Critical Care performed: No ____________________________________________   INITIAL IMPRESSION / ASSESSMENT AND PLAN / ED COURSE  Pertinent labs & imaging results that were available during my care of the patient were reviewed by me and considered in my medical decision making (see chart for details).  72 year old female with PMH as noted above presents with primarily upper abdominal pain over the last 2 weeks, worse in the last 2 days, and associated with nausea, vomiting, and a sour taste in her mouth.  Past medical records reviewed in Epic; the patient was last seen in the ED the last year for lightheadedness, and last admitted almost 3 years ago for acute renal failure.  On exam today, she is uncomfortable but not acutely ill-appearing.  She has some tenderness primarily in the upper abdomen but no peritoneal signs.  The remainder of the exam is as described above.  Differential includes gastritis, PUD, GERD, colitis or diverticulitis, or less likely SBO, UTI, or hepatobiliary cause.  Plan: Analgesia, antiemetic, IV fluids, and CT abdomen.     ----------------------------------------- 11:40 PM on 03/07/2018 -----------------------------------------   Patient's UA is consistent with possible UTI.  Her glucose is elevated but she does not have a low bicarbonate or elevated anion gap, so there is no evidence of DKA.    The patient is still pending CT of her abdomen.  If no acute findings on CT, given her level of discomfort and difficulty tolerating p.o., she may require admission.  I have signed the patient out to the oncoming physician Dr. Darnelle Catalan.    ____________________________________________   FINAL CLINICAL IMPRESSION(S) / ED DIAGNOSES  Final diagnoses:  Urinary tract infection without hematuria, site unspecified  Abdominal pain, unspecified abdominal location      NEW MEDICATIONS STARTED DURING THIS VISIT:  New Prescriptions   No medications on file     Note:  This document was prepared using Dragon voice recognition software and may include unintentional dictation errors.    Dionne Bucy, MD 03/07/18 [phone]

## 2018-03-08 ENCOUNTER — Encounter: Payer: Self-pay | Admitting: Radiology

## 2018-03-08 ENCOUNTER — Emergency Department: Payer: Medicare (Managed Care)

## 2018-03-08 LAB — GLUCOSE, CAPILLARY
GLUCOSE-CAPILLARY: 298 mg/dL — AB (ref 65–99)
GLUCOSE-CAPILLARY: 331 mg/dL — AB (ref 65–99)
GLUCOSE-CAPILLARY: 418 mg/dL — AB (ref 65–99)
Glucose-Capillary: 343 mg/dL — ABNORMAL HIGH (ref 65–99)

## 2018-03-08 MED ORDER — SODIUM CHLORIDE 0.9 % IV BOLUS
1000.0000 mL | Freq: Once | INTRAVENOUS | Status: AC
  Administered 2018-03-08: 1000 mL via INTRAVENOUS

## 2018-03-08 MED ORDER — INSULIN ASPART 100 UNIT/ML ~~LOC~~ SOLN
4.0000 [IU] | Freq: Once | SUBCUTANEOUS | Status: AC
Start: 2018-03-08 — End: 2018-03-08
  Administered 2018-03-08: 4 [IU] via SUBCUTANEOUS
  Filled 2018-03-08: qty 1

## 2018-03-08 MED ORDER — IOPAMIDOL (ISOVUE-370) INJECTION 76%
75.0000 mL | Freq: Once | INTRAVENOUS | Status: AC | PRN
  Administered 2018-03-08: 75 mL via INTRAVENOUS

## 2018-03-08 MED ORDER — CEPHALEXIN 500 MG PO CAPS: 500.0000 mg | ORAL_CAPSULE | Freq: Four times a day (QID) | ORAL | 0 refills | Status: AC

## 2018-03-08 MED ORDER — CEFTRIAXONE SODIUM 1 G IJ SOLR
1.0000 g | Freq: Once | INTRAMUSCULAR | Status: AC
  Administered 2018-03-08: 1 g via INTRAVENOUS
  Filled 2018-03-08: qty 10

## 2018-03-08 MED ORDER — CEPHALEXIN 500 MG PO CAPS
500.0000 mg | ORAL_CAPSULE | Freq: Once | ORAL | Status: AC
  Administered 2018-03-08: 500 mg via ORAL
  Filled 2018-03-08: qty 1

## 2018-03-08 MED ORDER — INSULIN ASPART 100 UNIT/ML ~~LOC~~ SOLN
4.0000 [IU] | Freq: Once | SUBCUTANEOUS | Status: AC
  Administered 2018-03-08: 4 [IU] via INTRAVENOUS
  Filled 2018-03-08: qty 1

## 2018-03-08 NOTE — ED Notes (Signed)
Called daughter and daughter is coming to pick up patient,

## 2018-03-08 NOTE — ED Provider Notes (Signed)
CT is read by radiology as revealing no acute change. Her urine is consistent with infection. She had a fever as well.  ----------------------------------------- 4:42 AM on 03/08/2018 -----------------------------------------  At this point the patient is feeling well. She is having no abdominal pain she is not having any other complaints I will let her go. patient had a fever but the fever has since gone down after the fever went down her heart rate went up. She did not meet SIRS criteria.   Arnaldo Natal, MD 03/08/18 [phone]

## 2018-03-08 NOTE — Discharge Instructions (Addendum)
Please take the Keflex one pill 4 times a day, this should treat your urinary tract infection and make your belly pain feel better. This may also help your blood sugar go back down towards normal. Please check your sugars at least twice a day for now. Please follow-up with your doctor in the next day or 2 to see how you are doing.please see her doctor or return sooner if your blood sugar is over 300.

## 2018-04-24 ENCOUNTER — Encounter: Payer: Self-pay | Admitting: Emergency Medicine

## 2018-04-24 ENCOUNTER — Emergency Department
Admission: EM | Admit: 2018-04-24 | Discharge: 2018-04-24 | Disposition: A | Discharge status: Home / Self Care | Payer: Medicare (Managed Care) | Attending: Emergency Medicine | Admitting: Emergency Medicine

## 2018-04-24 ENCOUNTER — Other Ambulatory Visit: Payer: Self-pay

## 2018-04-24 ENCOUNTER — Emergency Department: Payer: Medicare (Managed Care)

## 2018-04-24 DIAGNOSIS — M7651 Patellar tendinitis, right knee: Secondary | ICD-10-CM | POA: Diagnosis not present

## 2018-04-24 DIAGNOSIS — Z7902 Long term (current) use of antithrombotics/antiplatelets: Secondary | ICD-10-CM | POA: Insufficient documentation

## 2018-04-24 DIAGNOSIS — Z7982 Long term (current) use of aspirin: Secondary | ICD-10-CM | POA: Diagnosis not present

## 2018-04-24 DIAGNOSIS — I10 Essential (primary) hypertension: Secondary | ICD-10-CM | POA: Insufficient documentation

## 2018-04-24 DIAGNOSIS — M25461 Effusion, right knee: Secondary | ICD-10-CM | POA: Diagnosis not present

## 2018-04-24 DIAGNOSIS — M25561 Pain in right knee: Secondary | ICD-10-CM | POA: Diagnosis present

## 2018-04-24 DIAGNOSIS — Z794 Long term (current) use of insulin: Secondary | ICD-10-CM | POA: Diagnosis not present

## 2018-04-24 DIAGNOSIS — E119 Type 2 diabetes mellitus without complications: Secondary | ICD-10-CM | POA: Diagnosis not present

## 2018-04-24 DIAGNOSIS — Z79899 Other long term (current) drug therapy: Secondary | ICD-10-CM | POA: Insufficient documentation

## 2018-04-24 MED ORDER — TRAMADOL HCL 50 MG PO TABS: 50.0000 mg | ORAL_TABLET | Freq: Two times a day (BID) | ORAL | 0 refills | Status: DC | PRN

## 2018-04-24 NOTE — ED Triage Notes (Addendum)
Pt comes into the ED via [facility] where she was picked up at the grocerie store while trying to get on the transportation bus.  Patient states that she was going up the second stair of the us when her right knee went out on her and she felt a "pop".  Patient was able to ambulate afterwards on scene but she states the pain is getting worse.  Patient has no obvious deformity noted to the leg.  Patient in NAD at this time with even and unlabored respirations. Denies hitting her head when she fell, patient states she was holding onto both arm rails and was able to gently guide herself down to her bottom.

## 2018-04-24 NOTE — ED Provider Notes (Addendum)
[facility] Emergency Department Provider Note   ____________________________________________   First MD Initiated Contact with Patient 04/24/18 1251     (approximate)  I have reviewed the triage vital signs and the nursing notes.   HISTORY  Chief Complaint Knee Pain    HPI [name] Munoz is a 72 y.o. female patient complain of right knee pain secondary to trying to board a bus.  Patient states she was going up the second stairs of the bus when she felt a "pop" in her knee gave out.  Patient states she is able to ambulate but the pain has increased.  Incident occurred approximately 1 hour ago.  Patient rates the pain as a 5/10.  Patient described the pain is "achy".  No palliative measures for complaint.  Past Medical History:  Diagnosis Date  . Coronary artery disease   . Depression   . Diabetes mellitus without complication (HCC)   . GERD (gastroesophageal reflux disease)   . Hypertension     Patient Active Problem List   Diagnosis Date Noted  . UTI (urinary tract infection) 10/21/2015  . Acute renal failure (ARF) (HCC) 10/19/2015  . Intractable vomiting 09/18/2015    Past Surgical History:  Procedure Laterality Date  . ESOPHAGOGASTRODUODENOSCOPY N/A 09/20/2015   Procedure: ESOPHAGOGASTRODUODENOSCOPY (EGD);  Surgeon: Scot Junobert T Elliott, MD;  [facility] END[facility]COPY;  Service: Endoscopy;  Laterality: N/A;  . PERCUTANEOUS CORONARY ROTOBLATOR INTERVENTION (PCI-R)      Prior to Admission medications   Medication Sig Start Date End Date Taking? Authorizing Provider  aspirin EC 81 MG tablet Take 81 mg by mouth daily.    [provider]  atorvastatin (LIPITOR) 40 MG tablet Take 40 mg by mouth daily.    [provider]  buPROPion (WELLBUTRIN XL) 150 MG 24 hr tablet Take 150 mg by mouth daily.    [provider]  citalopram (CELEXA) 40 MG tablet Take 40 mg by mouth daily.    [provider]  clopidogrel (PLAVIX) 75  MG tablet Take 75 mg by mouth daily.    [provider]  ergocalciferol (VITAMIN D2) 50000 UNITS capsule Take 50,000 Units by mouth every 14 (fourteen) days. On the first and third Monday of each month    [provider]  insulin glargine (LANTUS) 100 UNIT/ML injection Inject 30 Units into the skin 2 (two) times daily.     [provider]  pioglitazone (ACTOS) 15 MG tablet Take 15 mg by mouth daily.    [provider]  polyethylene glycol (MIRALAX / GLYCOLAX) packet Take 17 g by mouth daily as needed for mild constipation.    [provider]  pregabalin (LYRICA) 75 MG capsule Take 75 mg by mouth 3 (three) times daily.    [provider]  ranitidine (ZANTAC) 150 MG tablet Take 150 mg by mouth 2 (two) times daily as needed.     [provider]  traMADol (ULTRAM) 50 MG tablet Take 1 tablet (50 mg total) by mouth every 12 (twelve) hours as needed. 04/24/18   Joni ReiningSmith, Danylle Ouk K, PA-C    Allergies Patient has no known allergies.  Family History  Problem Relation Age of Onset  . Diabetes Mellitus II Maternal Aunt   . Diabetes Mellitus I Other     Social History Social History   Tobacco Use  . Smoking status: Never Smoker  . Smokeless tobacco: Never Used  Substance Use Topics  . Alcohol use: No    Alcohol/week: 0.0 oz  .  Drug use: No    Review of Systems Constitutional: No fever/chills Eyes: No visual changes. ENT: No sore throat. Cardiovascular: Denies chest pain. Respiratory: Denies shortness of breath. Gastrointestinal: No abdominal pain.  No nausea, no vomiting.  No diarrhea.  No constipation. Genitourinary: Negative for dysuria. Musculoskeletal: Negative for back pain. Skin: Negative for rash. Neurological: Negative for headaches, focal weakness or numbness. Psychiatric:Depression Endocrine: Diabetes, hypertension, renal insufficiency.  ____________________________________________   PHYSICAL EXAM:  VITAL  SIGNS: ED Triage Vitals [04/24/18 1239]  Enc Vitals Group     BP 140/87     Pulse Rate 78     Resp 16     Temp 99.5 F (37.5 C)     Temp Source Oral     SpO2 94 %     Weight 213 lb (96.6 kg)     Height 5\' 9"  (1.753 m)     Head Circumference      Peak Flow      Pain Score 5     Pain Loc      Pain Edu?      Excl. in GC?     Constitutional: Alert and oriented. Well appearing and in no acute distress. Cardiovascular: Normal rate, regular rhythm. Grossly normal heart sounds.  Good peripheral circulation. Respiratory: Normal respiratory effort.  No retractions. Lungs CTAB. Musculoskeletal: No obvious deformity to the right knee.  Patient has moderate crepitus with palpation inferior patella. Neurologic:  Normal speech and language. No gross focal neurologic deficits are appreciated. No gait instability. Skin:  Skin is warm, dry and intact. No rash noted. Psychiatric: Mood and affect are normal. Speech and behavior are normal.  ____________________________________________   LABS (all labs ordered are listed, but only abnormal results are displayed)  Labs Reviewed - No data to display ____________________________________________  EKG   ____________________________________________  RADIOLOGY  ED MD interpretation:    Official radiology report(s): Dg Knee Complete 4 Views Right  Result Date: 04/24/2018 CLINICAL DATA:  Right knee gave out and felt pop while walking. EXAM: RIGHT KNEE - COMPLETE 4+ VIEW COMPARISON:  None. FINDINGS: Minimal degenerative changes of the medial compartment and patellofemoral joints. Possible small joint effusion. No evidence of fracture or dislocation. IMPRESSION: No acute fracture or dislocation. Possible small joint effusion. Minimal osteoarthritic change. Electronically Signed   By: Elberta Fortis M.D.   On: 04/24/2018 13:14    ____________________________________________   PROCEDURES  Procedure(s) performed: None  Procedures  Critical  Care performed: No  ____________________________________________   INITIAL IMPRESSION / ASSESSMENT AND PLAN / ED COURSE  As part of my medical decision making, I reviewed the following data within the electronic MEDICAL RECORD NUMBER    Right knee pain secondary to patellar tendinitis with mild effusion.  Discussed x-ray findings with patient.  Patient given discharge care instruction.  Patient placed in a knee immobilizer and advised take medication as directed.  Patient advised to follow orthopedic if no improvement in 1 week.  Return to ED if condition worsens.      ____________________________________________   FINAL CLINICAL IMPRESSION(S) / ED DIAGNOSES  Final diagnoses:  Effusion of right knee  Patellar tendinitis of right knee     ED Discharge Orders        Ordered    traMADol (ULTRAM) 50 MG tablet  Every 12 hours PRN     04/24/18 1328       Note:  This document was prepared using Dragon voice recognition software and may include unintentional dictation errors.  Joni Reining, PA-C 04/24/18 1338    Joni Reining, PA-C 04/24/18 1419    Emily Filbert, MD 04/24/18 [phone]

## 2018-04-24 NOTE — ED Notes (Signed)
Pt c/o right knee pain after getting into a van and feeling her knee "pop". Pt states after the pop of her right knee she was no longer able to put any weight on that leg. Pt denies any hx of injuring her right knee.

## 2018-04-24 NOTE — ED Notes (Signed)
First RN note:  Pt came in via [facility] from getting groceries where she fell trying to get on the bus.  States she felt a "pop" but was able to be ambulatory again after that.  Pain is progressing for patient.  Patient had stable VSS.

## 2018-04-24 NOTE — Discharge Instructions (Addendum)
Do not wear knee support when sleeping.

## 2018-10-17 ENCOUNTER — Encounter: Payer: Self-pay | Admitting: Emergency Medicine

## 2018-10-17 ENCOUNTER — Observation Stay
Admission: EM | Admit: 2018-10-17 | Discharge: 2018-10-18 | Disposition: A | Discharge status: Home / Self Care | Payer: Medicare (Managed Care) | Attending: Internal Medicine | Admitting: Internal Medicine

## 2018-10-17 ENCOUNTER — Other Ambulatory Visit: Payer: Self-pay

## 2018-10-17 DIAGNOSIS — R739 Hyperglycemia, unspecified: Secondary | ICD-10-CM | POA: Diagnosis present

## 2018-10-17 DIAGNOSIS — Z7982 Long term (current) use of aspirin: Secondary | ICD-10-CM | POA: Insufficient documentation

## 2018-10-17 DIAGNOSIS — I129 Hypertensive chronic kidney disease with stage 1 through stage 4 chronic kidney disease, or unspecified chronic kidney disease: Secondary | ICD-10-CM | POA: Diagnosis not present

## 2018-10-17 DIAGNOSIS — I214 Non-ST elevation (NSTEMI) myocardial infarction: Secondary | ICD-10-CM | POA: Diagnosis present

## 2018-10-17 DIAGNOSIS — N184 Chronic kidney disease, stage 4 (severe): Secondary | ICD-10-CM | POA: Insufficient documentation

## 2018-10-17 DIAGNOSIS — R7989 Other specified abnormal findings of blood chemistry: Secondary | ICD-10-CM | POA: Diagnosis not present

## 2018-10-17 DIAGNOSIS — N289 Disorder of kidney and ureter, unspecified: Secondary | ICD-10-CM

## 2018-10-17 DIAGNOSIS — I340 Nonrheumatic mitral (valve) insufficiency: Secondary | ICD-10-CM | POA: Insufficient documentation

## 2018-10-17 DIAGNOSIS — I959 Hypotension, unspecified: Secondary | ICD-10-CM | POA: Diagnosis present

## 2018-10-17 DIAGNOSIS — K5909 Other constipation: Secondary | ICD-10-CM | POA: Insufficient documentation

## 2018-10-17 DIAGNOSIS — E1122 Type 2 diabetes mellitus with diabetic chronic kidney disease: Secondary | ICD-10-CM | POA: Diagnosis not present

## 2018-10-17 DIAGNOSIS — Z794 Long term (current) use of insulin: Secondary | ICD-10-CM | POA: Insufficient documentation

## 2018-10-17 DIAGNOSIS — K529 Noninfective gastroenteritis and colitis, unspecified: Secondary | ICD-10-CM | POA: Insufficient documentation

## 2018-10-17 DIAGNOSIS — N179 Acute kidney failure, unspecified: Secondary | ICD-10-CM | POA: Diagnosis not present

## 2018-10-17 DIAGNOSIS — F329 Major depressive disorder, single episode, unspecified: Secondary | ICD-10-CM | POA: Diagnosis not present

## 2018-10-17 DIAGNOSIS — K219 Gastro-esophageal reflux disease without esophagitis: Secondary | ICD-10-CM | POA: Insufficient documentation

## 2018-10-17 DIAGNOSIS — Z833 Family history of diabetes mellitus: Secondary | ICD-10-CM | POA: Insufficient documentation

## 2018-10-17 DIAGNOSIS — Z6832 Body mass index (BMI) 32.0-32.9, adult: Secondary | ICD-10-CM | POA: Insufficient documentation

## 2018-10-17 DIAGNOSIS — Z7902 Long term (current) use of antithrombotics/antiplatelets: Secondary | ICD-10-CM | POA: Insufficient documentation

## 2018-10-17 DIAGNOSIS — I2510 Atherosclerotic heart disease of native coronary artery without angina pectoris: Secondary | ICD-10-CM | POA: Diagnosis not present

## 2018-10-17 DIAGNOSIS — Z955 Presence of coronary angioplasty implant and graft: Secondary | ICD-10-CM | POA: Insufficient documentation

## 2018-10-17 DIAGNOSIS — Z79899 Other long term (current) drug therapy: Secondary | ICD-10-CM | POA: Insufficient documentation

## 2018-10-17 DIAGNOSIS — E782 Mixed hyperlipidemia: Secondary | ICD-10-CM | POA: Insufficient documentation

## 2018-10-17 LAB — BASIC METABOLIC PANEL
Anion gap: 11 (ref 5–15)
BUN: 25 mg/dL — AB (ref 8–23)
CHLORIDE: 98 mmol/L (ref 98–111)
CO2: 21 mmol/L — ABNORMAL LOW (ref 22–32)
CREATININE: 2.12 mg/dL — AB (ref 0.44–1.00)
Calcium: 9 mg/dL (ref 8.9–10.3)
GFR calc Af Amer: 26 mL/min — ABNORMAL LOW (ref >60)
GFR, EST NON AFRICAN AMERICAN: 23 mL/min — AB (ref >60)
Glucose, Bld: 535 mg/dL (ref 70–99)
POTASSIUM: 5.6 mmol/L — AB (ref 3.5–5.1)
SODIUM: 130 mmol/L — AB (ref 135–145)

## 2018-10-17 LAB — GLUCOSE, CAPILLARY
GLUCOSE-CAPILLARY: 423 mg/dL — AB (ref 70–99)
Glucose-Capillary: 469 mg/dL — ABNORMAL HIGH (ref 70–99)
Glucose-Capillary: 272 mg/dL — ABNORMAL HIGH (ref 70–99)
Glucose-Capillary: 152 mg/dL — ABNORMAL HIGH (ref 70–99)

## 2018-10-17 LAB — PROTIME-INR
INR: 0.98
PROTHROMBIN TIME: 12.9 s (ref 11.4–15.2)

## 2018-10-17 LAB — CBC WITH DIFFERENTIAL/PLATELET
Abs Immature Granulocytes: 0.06 10*3/uL (ref 0.00–0.07)
BASOS ABS: 0 10*3/uL (ref 0.0–0.1)
BASOS PCT: 0 %
EOS PCT: 0 %
Eosinophils Absolute: 0 10*3/uL (ref 0.0–0.5)
HCT: 47.3 % — ABNORMAL HIGH (ref 36.0–46.0)
Hemoglobin: 15.6 g/dL — ABNORMAL HIGH (ref 12.0–15.0)
Immature Granulocytes: 1 %
Lymphocytes Relative: 8 %
Lymphs Abs: 0.8 10*3/uL (ref 0.7–4.0)
MCH: 29.7 pg (ref 26.0–34.0)
MCHC: 33 g/dL (ref 30.0–36.0)
MCV: 89.9 fL (ref 80.0–100.0)
MONO ABS: 0.5 10*3/uL (ref 0.1–1.0)
Monocytes Relative: 5 %
NEUTROS ABS: 8.4 10*3/uL — AB (ref 1.7–7.7)
NRBC: 0 % (ref 0.0–0.2)
Neutrophils Relative %: 86 %
PLATELETS: 145 10*3/uL — AB (ref 150–400)
RBC: 5.26 MIL/uL — AB (ref 3.87–5.11)
RDW: 12.6 % (ref 11.5–15.5)
WBC: 9.8 10*3/uL (ref 4.0–10.5)

## 2018-10-17 LAB — HEMOGLOBIN A1C
Hgb A1c MFr Bld: 12.1 % — ABNORMAL HIGH (ref 4.8–5.6)
Mean Plasma Glucose: 300.57 mg/dL

## 2018-10-17 LAB — TROPONIN I
TROPONIN I: 0.08 ng/mL — AB (ref <0.03)
Troponin I: 0.14 ng/mL (ref <0.03)
Troponin I: 0.09 ng/mL (ref <0.03)
Troponin I: 0.08 ng/mL (ref <0.03)

## 2018-10-17 LAB — TYPE AND SCREEN
ABO/RH(D): O POS
Antibody Screen: NEGATIVE

## 2018-10-17 LAB — APTT: aPTT: 24 seconds (ref 24–36)

## 2018-10-17 LAB — HEPARIN LEVEL (UNFRACTIONATED): Heparin Unfractionated: 0.62 IU/mL (ref 0.30–0.70)

## 2018-10-17 MED ORDER — ONDANSETRON HCL 4 MG PO TABS: 4.0000 mg | ORAL_TABLET | Freq: Three times a day (TID) | ORAL | Status: DC | PRN

## 2018-10-17 MED ORDER — PREGABALIN 75 MG PO CAPS
75.0000 mg | ORAL_CAPSULE | Freq: Three times a day (TID) | ORAL | Status: DC
  Administered 2018-10-17 – 2018-10-18 (×3): 75 mg via ORAL
  Filled 2018-10-17 (×3): qty 1

## 2018-10-17 MED ORDER — HEPARIN BOLUS VIA INFUSION
4000.0000 [IU] | Freq: Once | INTRAVENOUS | Status: AC
  Administered 2018-10-17: 4000 [IU] via INTRAVENOUS
  Filled 2018-10-17: qty 4000

## 2018-10-17 MED ORDER — POLYETHYLENE GLYCOL 3350 17 G PO PACK: 17.0000 g | PACK | Freq: Every day | ORAL | Status: DC | PRN

## 2018-10-17 MED ORDER — ONDANSETRON HCL 4 MG/2ML IJ SOLN: 4.0000 mg | Freq: Four times a day (QID) | INTRAMUSCULAR | Status: DC | PRN

## 2018-10-17 MED ORDER — CITALOPRAM HYDROBROMIDE 20 MG PO TABS
40.0000 mg | ORAL_TABLET | Freq: Every day | ORAL | Status: DC
  Administered 2018-10-17 – 2018-10-18 (×2): 40 mg via ORAL
  Filled 2018-10-17 (×2): qty 2

## 2018-10-17 MED ORDER — INSULIN ASPART 100 UNIT/ML ~~LOC~~ SOLN
SUBCUTANEOUS | Status: AC
  Administered 2018-10-17: 20 [IU] via SUBCUTANEOUS
  Filled 2018-10-17: qty 1

## 2018-10-17 MED ORDER — ACETAMINOPHEN 325 MG PO TABS
650.0000 mg | ORAL_TABLET | Freq: Four times a day (QID) | ORAL | Status: DC | PRN
  Administered 2018-10-17 (×2): 650 mg via ORAL
  Filled 2018-10-17 (×2): qty 2

## 2018-10-17 MED ORDER — INSULIN GLARGINE 100 UNIT/ML ~~LOC~~ SOLN
30.0000 [IU] | Freq: Two times a day (BID) | SUBCUTANEOUS | Status: DC
  Administered 2018-10-17 – 2018-10-18 (×3): 30 [IU] via SUBCUTANEOUS
  Filled 2018-10-17 (×4): qty 0.3

## 2018-10-17 MED ORDER — SODIUM CHLORIDE 0.9 % IV BOLUS
500.0000 mL | Freq: Once | INTRAVENOUS | Status: AC
  Administered 2018-10-17: 500 mL via INTRAVENOUS

## 2018-10-17 MED ORDER — SODIUM CHLORIDE 0.9 % IV SOLN
INTRAVENOUS | Status: DC
  Administered 2018-10-17: 15:00:00 via INTRAVENOUS

## 2018-10-17 MED ORDER — ASPIRIN 81 MG PO CHEW
324.0000 mg | CHEWABLE_TABLET | Freq: Once | ORAL | Status: AC
  Administered 2018-10-17: 324 mg via ORAL
  Filled 2018-10-17: qty 4

## 2018-10-17 MED ORDER — INSULIN ASPART 100 UNIT/ML ~~LOC~~ SOLN
20.0000 [IU] | Freq: Once | SUBCUTANEOUS | Status: AC
  Administered 2018-10-17: 20 [IU] via SUBCUTANEOUS
  Filled 2018-10-17: qty 1

## 2018-10-17 MED ORDER — INSULIN ASPART 100 UNIT/ML ~~LOC~~ SOLN
0.0000 [IU] | Freq: Three times a day (TID) | SUBCUTANEOUS | Status: DC
  Administered 2018-10-17: 8 [IU] via SUBCUTANEOUS
  Administered 2018-10-18 (×2): 3 [IU] via SUBCUTANEOUS
  Filled 2018-10-17 (×3): qty 1

## 2018-10-17 MED ORDER — INSULIN ASPART 100 UNIT/ML ~~LOC~~ SOLN
4.0000 [IU] | Freq: Three times a day (TID) | SUBCUTANEOUS | Status: DC
  Administered 2018-10-17 – 2018-10-18 (×3): 4 [IU] via SUBCUTANEOUS
  Filled 2018-10-17 (×3): qty 1

## 2018-10-17 MED ORDER — CLOPIDOGREL BISULFATE 75 MG PO TABS
75.0000 mg | ORAL_TABLET | Freq: Every day | ORAL | Status: DC
  Administered 2018-10-17 – 2018-10-18 (×2): 75 mg via ORAL
  Filled 2018-10-17 (×2): qty 1

## 2018-10-17 MED ORDER — BUPROPION HCL ER (XL) 150 MG PO TB24
150.0000 mg | ORAL_TABLET | Freq: Every day | ORAL | Status: DC
  Administered 2018-10-17 – 2018-10-18 (×2): 150 mg via ORAL
  Filled 2018-10-17 (×2): qty 1

## 2018-10-17 MED ORDER — ACETAMINOPHEN 650 MG RE SUPP: 650.0000 mg | Freq: Four times a day (QID) | RECTAL | Status: DC | PRN

## 2018-10-17 MED ORDER — FAMOTIDINE 20 MG PO TABS
20.0000 mg | ORAL_TABLET | Freq: Two times a day (BID) | ORAL | Status: DC
  Administered 2018-10-17 – 2018-10-18 (×3): 20 mg via ORAL
  Filled 2018-10-17 (×3): qty 1

## 2018-10-17 MED ORDER — HEPARIN (PORCINE) 25000 UT/250ML-% IV SOLN
1000.0000 [IU]/h | INTRAVENOUS | Status: DC
  Administered 2018-10-17: 1000 [IU]/h via INTRAVENOUS
  Filled 2018-10-17: qty 250

## 2018-10-17 MED ORDER — ONDANSETRON HCL 4 MG PO TABS: 4.0000 mg | ORAL_TABLET | Freq: Four times a day (QID) | ORAL | Status: DC | PRN

## 2018-10-17 MED ORDER — ASPIRIN EC 81 MG PO TBEC
81.0000 mg | DELAYED_RELEASE_TABLET | Freq: Every day | ORAL | Status: DC
  Administered 2018-10-18: 81 mg via ORAL
  Filled 2018-10-17: qty 1

## 2018-10-17 MED ORDER — PIOGLITAZONE HCL 15 MG PO TABS
15.0000 mg | ORAL_TABLET | Freq: Every day | ORAL | Status: DC
  Administered 2018-10-17 – 2018-10-18 (×2): 15 mg via ORAL
  Filled 2018-10-17 (×2): qty 1

## 2018-10-17 MED ORDER — ATORVASTATIN CALCIUM 20 MG PO TABS
40.0000 mg | ORAL_TABLET | Freq: Every day | ORAL | Status: DC
  Administered 2018-10-17 – 2018-10-18 (×2): 40 mg via ORAL
  Filled 2018-10-17 (×2): qty 2

## 2018-10-17 MED ORDER — INSULIN ASPART 100 UNIT/ML ~~LOC~~ SOLN
20.0000 [IU] | Freq: Once | SUBCUTANEOUS | Status: AC
  Administered 2018-10-17: 20 [IU] via SUBCUTANEOUS

## 2018-10-17 NOTE — ED Triage Notes (Signed)
Pt arrives via ems from home. PT called ems after fall due to pt's inability to get up. EMS reports hypotension. EMS reports orthostatic changes with blood pressure. EMS also notes bloody stool in bathroom. Pt arrives a & o x 4.

## 2018-10-17 NOTE — ED Notes (Signed)
Cardiology to bedside at this time.

## 2018-10-17 NOTE — ED Notes (Signed)
Hospitalist to bedside at this time 

## 2018-10-17 NOTE — ED Provider Notes (Signed)
[facility] Emergency Department Provider Note ____________________________________________   First MD Initiated Contact with Patient 10/17/18 1106     (approximate)  I have reviewed the triage vital signs and the nursing notes.   HISTORY  Chief Complaint Hypotension    HPI [name] Munoz is a 72 y.o. female with PMH as noted below who presents with weakness, chronic but acutely worsened today, and associated with inability to get up after she fell.  The patient states that she tripped and fell this morning after getting out of bed, but then was unable to get up.  She states that she normally cannot get up when she falls but felt somewhat weaker than usual.  She also reports alternating constipation and then diarrhea after she takes laxatives recently, and she states that she has had some blood in the stool recently.  She denies other acute symptoms.  She denies any chest pain or shortness of breath.   Past Medical History:  Diagnosis Date  . Coronary artery disease   . Depression   . Diabetes mellitus without complication (HCC)   . GERD (gastroesophageal reflux disease)   . Hypertension     Patient Active Problem List   Diagnosis Date Noted  . Hypotension 10/17/2018  . UTI (urinary tract infection) 10/21/2015  . Acute renal failure (ARF) (HCC) 10/19/2015  . Intractable vomiting 09/18/2015    Past Surgical History:  Procedure Laterality Date  . ESOPHAGOGASTRODUODENOSCOPY N/A 09/20/2015   Procedure: ESOPHAGOGASTRODUODENOSCOPY (EGD);  Surgeon: Scot Junobert T Elliott, MD;  [facility];  Service: Endoscopy;  Laterality: N/A;  . PERCUTANEOUS CORONARY ROTOBLATOR INTERVENTION (PCI-R)      Prior to Admission medications   Medication Sig Start Date End Date Taking? Authorizing Provider  aspirin EC 81 MG tablet Take 81 mg by mouth daily.   Yes [provider]  atorvastatin (LIPITOR) 40 MG tablet Take 40 mg by mouth daily.   Yes [provider]  buPROPion (WELLBUTRIN XL) 150 MG 24 hr tablet Take 150 mg by mouth daily.   Yes [provider]  citalopram (CELEXA) 40 MG tablet Take 40 mg by mouth daily.   Yes [provider]  clopidogrel (PLAVIX) 75 MG tablet Take 75 mg by mouth daily.   Yes [provider]  lisinopril-hydrochlorothiazide (PRINZIDE,ZESTORETIC) 20-25 MG tablet Take 1 tablet by mouth daily.   Yes [provider]  metoprolol succinate (TOPROL-XL) 25 MG 24 hr tablet Take 1 tablet by mouth daily.   Yes [provider]  ondansetron (ZOFRAN) 4 MG tablet Take 1 tablet by mouth every 6 (six) hours as needed. 09/18/15  Yes [provider]  pioglitazone (ACTOS) 15 MG tablet Take 15 mg by mouth daily.   Yes [provider]  polyethylene glycol (MIRALAX / GLYCOLAX) packet Take 17 g by mouth daily as needed for mild constipation.   Yes [provider]  pregabalin (LYRICA) 75 MG capsule Take 75 mg by mouth 3 (three) times daily.   Yes [provider]  ranitidine (ZANTAC) 150 MG tablet Take 150 mg by mouth 2 (two) times daily as needed.    Yes [provider]  ergocalciferol (VITAMIN D2) 50000 UNITS capsule Take 50,000 Units by mouth every 14 (fourteen) days. On the first and third Monday of each month    [provider]  insulin glargine (LANTUS) 100 UNIT/ML injection Inject 30 Units into the skin 2 (two) times daily.     [provider]  traMADol Janean Sark(ULTRAM)  50 MG tablet Take 1 tablet (50 mg total) by mouth every 12 (twelve) hours as needed. Patient not taking: Reported on 10/17/2018 04/24/18   Joni Reining, PA-C    Allergies Patient has no known allergies.  Family History  Problem Relation Age of Onset  . Diabetes Mellitus II Maternal Aunt   . Diabetes Mellitus I Other     Social History Social History   Tobacco Use  . Smoking status: Never Smoker  . Smokeless tobacco: Never Used  Substance Use Topics  .  Alcohol use: No    Alcohol/week: 0.0 standard drinks  . Drug use: No    Review of Systems  Constitutional: No fever.  Positive for weakness. Eyes: No redness. ENT: No neck pain. Cardiovascular: Denies chest pain. Respiratory: Denies shortness of breath. Gastrointestinal: No vomiting.  Genitourinary: Negative for dysuria or frequency.  Musculoskeletal: Negative for back pain. Skin: Negative for rash. Neurological: Negative for headache.   ____________________________________________   PHYSICAL EXAM:  VITAL SIGNS: ED Triage Vitals  Enc Vitals Group     BP 10/17/18 1104 (!) 114/55     Pulse Rate 10/17/18 1102 76     Resp 10/17/18 1102 16     Temp 10/17/18 1058 98 F (36.7 C)     Temp src --      SpO2 10/17/18 1102 100 %     Weight 10/17/18 1100 208 lb (94.3 kg)     Height 10/17/18 1100 5\' 9"  (1.753 m)     Head Circumference --      Peak Flow --      Pain Score 10/17/18 1100 2     Pain Loc --      Pain Edu? --      Excl. in GC? --     Constitutional: Alert and oriented.  Relatively comfortable appearing and in no acute distress. Eyes: Conjunctivae are normal.  EOMI.  PERRLA. Head: Atraumatic. Nose: No congestion/rhinnorhea. Mouth/Throat: Mucous membranes are dry.   Neck: Normal range of motion.  No midline cervical spinal tenderness. Cardiovascular: Normal rate, regular rhythm. Grossly normal heart sounds.  Good peripheral circulation. Respiratory: Normal respiratory effort.  No retractions. Lungs CTAB. Gastrointestinal: Soft and nontender. No distention.  Genitourinary: No flank tenderness. Musculoskeletal: No lower extremity edema.  Extremities warm and well perfused.  Neurologic:  Normal speech and language.  Motor intact in all extremities.  Normal coordination.  No gross focal neurologic deficits are appreciated.  Skin:  Skin is warm and dry. No rash noted. Psychiatric: Mood and affect are normal. Speech and behavior are  normal.  ____________________________________________   LABS (all labs ordered are listed, but only abnormal results are displayed)  Labs Reviewed  BASIC METABOLIC PANEL - Abnormal; Notable for the following components:      Result Value   Sodium 130 (*)    Potassium 5.6 (*)    CO2 21 (*)    Glucose, Bld 535 (*)    BUN 25 (*)    Creatinine, Ser 2.12 (*)    GFR calc non Af Amer 23 (*)    GFR calc Af Amer 26 (*)    All other components within normal limits  CBC WITH DIFFERENTIAL/PLATELET - Abnormal; Notable for the following components:   RBC 5.26 (*)    Hemoglobin 15.6 (*)    HCT 47.3 (*)    Platelets 145 (*)    Neutro Abs 8.4 (*)    All other components within normal limits  TROPONIN I - Abnormal;  Notable for the following components:   Troponin I 0.14 (*)    All other components within normal limits  PROTIME-INR  URINALYSIS, COMPLETE (UACMP) WITH MICROSCOPIC  APTT  TROPONIN I  TROPONIN I  TROPONIN I  HEMOGLOBIN A1C  HEPARIN LEVEL (UNFRACTIONATED)  TYPE AND SCREEN   ____________________________________________  EKG  ED ECG REPORT I, Dionne BucySebastian Amish Mintzer, the attending physician, personally viewed and interpreted this ECG.  Date: 10/17/2018 EKG Time: 1103 Rate: 77 Rhythm: normal sinus rhythm QRS Axis: normal Intervals: normal ST/T Wave abnormalities: Deep T wave inversions V2 to V3 with possible minimal ST elevation inferiorly Narrative Interpretation: New nonspecific ST abnormalities when compared to EKG from 04/04/2017  ED ECG REPORT I, Dionne BucySebastian Lyvia Mondesir, the attending physician, personally viewed and interpreted this ECG.  Date: 10/17/2018 EKG Time: 1141 Rate: 72 Rhythm: normal sinus rhythm QRS Axis: normal Intervals: normal ST/T Wave abnormalities: T wave inversions anteriorly slightly improved from EKG of 1103 today Narrative Interpretation: No significant dynamic  changes   ____________________________________________  RADIOLOGY    ____________________________________________   PROCEDURES  Procedure(s) performed: No  Procedures  Critical Care performed: Yes  CRITICAL CARE Performed by: Dionne BucySebastian Tawona Filsinger   Total critical care time: 20 minutes  Critical care time was exclusive of separately billable procedures and treating other patients.  Critical care was necessary to treat or prevent imminent or life-threatening deterioration.  Critical care was time spent personally by me on the following activities: development of treatment plan with patient and/or surrogate as well as nursing, discussions with consultants, evaluation of patient's response to treatment, examination of patient, obtaining history from patient or surrogate, ordering and performing treatments and interventions, ordering and review of laboratory studies, ordering and review of radiographic studies, pulse oximetry and re-evaluation of patient's condition. ____________________________________________   INITIAL IMPRESSION / ASSESSMENT AND PLAN / ED COURSE  Pertinent labs & imaging results that were available during my care of the patient were reviewed by me and considered in my medical decision making (see chart for details).  72 year old female with PMH as noted above including CAD with prior history of stents presents with weakness and inability to get up after a fall this morning.  She was also noted to be hypotensive by EMS.  On review of systems the patient does report some blood in her stool recently although states it has been a relatively small amount.  On exam, the patient is relatively comfortable appearing and her vital signs are normal in the ED.  Her neuro exam is nonfocal.  The remainder of the exam is as described above.  EKG shows deep anterior T wave inversions which are new when compared to EKG from last year and concerning in this patient with a  history of CAD, however she has having no symptoms of ACS at this time.  I gave a dose of aspirin.  I consulted Dr. Gwen PoundsKowalski from cardiology.  Differential for the patient's symptoms is broad but includes dehydration, infection, anemia, renal insufficiency, other metabolic etiology, or less likely cardiac cause.  We will obtain lab work-up and I plan to admit the patient given the acute EKG changes.  ----------------------------------------- 1:36 PM on 10/17/2018 -----------------------------------------  The troponin is elevated.  Repeat EKG after 40 minutes showed no dynamic changes other than mild improvement in the T wave inversions.  I discussed the case further with Dr. Gwen PoundsKowalski.  We will start the patient on a heparin drip.  I signed the patient out to the hospitalist for admission. ____________________________________________  FINAL CLINICAL IMPRESSION(S) / ED DIAGNOSES  Final diagnoses:  Non-STEMI (non-ST elevated myocardial infarction) (HCC)  Hyperglycemia  Acute renal insufficiency      NEW MEDICATIONS STARTED DURING THIS VISIT:  New Prescriptions   No medications on file     Note:  This document was prepared using Dragon voice recognition software and may include unintentional dictation errors.    Dionne Bucy, MD 10/17/18 [phone]

## 2018-10-17 NOTE — Consult Note (Signed)
[facility]  Patient ID: [name] NeatSusan Munoz, MRN: [id number], DOB/AGE: 04/26/1946 72 y.o. Admit date: 10/17/2018   Date of Consult: 10/17/2018 Primary Physician: Delton PrairieBrooks, Keatah B, FNP Primary Cardiologist: None  Chief Complaint:  Chief Complaint  Patient presents with  . Hypotension   Reason for Consult: Abnormal EKG  HPI: 72 y.o. female with known coronary artery disease status post PCI and stent placements in the remote past with essential hypertension mixed hyperlipidemia diabetes with complications chronic kidney disease stage IV.  The patient has done well recently without evidence of significant chest pain shortness of breath with and without physical activity and no evidence of congestive heart failure.  The patient has not had any previous history of coronary artery artery myocardial infarction.  The patient has had tripped over the edge of her bed and fallen and abraded her arm of which she felt like she was weak and fatigued and could not get up after that.  She felt dehydrated due to some of her medications and therefore was taken to the hospital.  Since hydration she has felt much better at this time and has had no evidence of chest pain or other rhythm disturbances or myocardial infarction.  Her troponin is 0.14 possibly consistent with kidney disease as well as other cardiovascular concerns but will watch closely.  Currently the patient is hemodynamically stable  Past Medical History:  Diagnosis Date  . Coronary artery disease   . Depression   . Diabetes mellitus without complication (HCC)   . GERD (gastroesophageal reflux disease)   . Hypertension       Surgical History:  Past Surgical History:  Procedure Laterality Date  . ESOPHAGOGASTRODUODENOSCOPY N/A 09/20/2015   Procedure: ESOPHAGOGASTRODUODENOSCOPY (EGD);  Surgeon: Scot Junobert T Elliott, MD;  [facility];  Service: Endoscopy;  Laterality: N/A;  . PERCUTANEOUS CORONARY ROTOBLATOR  INTERVENTION (PCI-R)       Home Meds: Prior to Admission medications   Medication Sig Start Date End Date Taking? Authorizing Provider  aspirin EC 81 MG tablet Take 81 mg by mouth daily.   Yes [provider]  atorvastatin (LIPITOR) 40 MG tablet Take 40 mg by mouth daily.   Yes [provider]  buPROPion (WELLBUTRIN XL) 150 MG 24 hr tablet Take 150 mg by mouth daily.   Yes [provider]  citalopram (CELEXA) 40 MG tablet Take 40 mg by mouth daily.   Yes [provider]  clopidogrel (PLAVIX) 75 MG tablet Take 75 mg by mouth daily.   Yes [provider]  lisinopril-hydrochlorothiazide (PRINZIDE,ZESTORETIC) 20-25 MG tablet Take 1 tablet by mouth daily.   Yes [provider]  metoprolol succinate (TOPROL-XL) 25 MG 24 hr tablet Take 1 tablet by mouth daily.   Yes [provider]  ondansetron (ZOFRAN) 4 MG tablet Take 1 tablet by mouth every 6 (six) hours as needed. 09/18/15  Yes [provider]  pioglitazone (ACTOS) 15 MG tablet Take 15 mg by mouth daily.   Yes [provider]  polyethylene glycol (MIRALAX / GLYCOLAX) packet Take 17 g by mouth daily as needed for mild constipation.   Yes [provider]  pregabalin (LYRICA) 75 MG capsule Take 75 mg by mouth 3 (three) times daily.   Yes [provider]  ranitidine (ZANTAC) 150 MG tablet Take 150 mg by mouth 2 (two) times daily as needed.    Yes [provider]  ergocalciferol (VITAMIN D2) 50000 UNITS capsule Take 50,000 Units by mouth every 14 (  fourteen) days. On the first and third Monday of each month    [provider]  insulin glargine (LANTUS) 100 UNIT/ML injection Inject 30 Units into the skin 2 (two) times daily.     [provider]  traMADol (ULTRAM) 50 MG tablet Take 1 tablet (50 mg total) by mouth every 12 (twelve) hours as needed. Patient not taking: Reported on 10/17/2018 04/24/18   Joni ReiningSmith, Ronald K, PA-C     Inpatient Medications:  . heparin  4,000 Units Intravenous Once  . insulin aspart  0-15 Units Subcutaneous TID WC  . insulin aspart  4 Units Subcutaneous TID WC   . heparin      Allergies: No Known Allergies  Social History   Socioeconomic History  . Marital status: Legally Separated    Spouse name: Not on file  . Number of children: Not on file  . Years of education: Not on file  . Highest education level: Not on file  Occupational History  . Not on file  Social Needs  . Financial resource strain: Not on file  . Food insecurity:    Worry: Not on file    Inability: Not on file  . Transportation needs:    Medical: Not on file    Non-medical: Not on file  Tobacco Use  . Smoking status: Never Smoker  . Smokeless tobacco: Never Used  Substance and Sexual Activity  . Alcohol use: No    Alcohol/week: 0.0 standard drinks  . Drug use: No  . Sexual activity: Not on file  Lifestyle  . Physical activity:    Days per week: Not on file    Minutes per session: Not on file  . Stress: Not on file  Relationships  . Social connections:    Talks on phone: Not on file    Gets together: Not on file    Attends religious service: Not on file    Active member of club or organization: Not on file    Attends meetings of clubs or organizations: Not on file    Relationship status: Not on file  . Intimate partner violence:    Fear of current or ex partner: Not on file    Emotionally abused: Not on file    Physically abused: Not on file    Forced sexual activity: Not on file  Other Topics Concern  . Not on file  Social History Narrative  . Not on file     Family History  Problem Relation Age of Onset  . Diabetes Mellitus II Maternal Aunt   . Diabetes Mellitus I Other      Review of Systems Positive for fall Negative for: General:  chills, fever, night sweats or weight changes.  Cardiovascular: PND orthopnea syncope dizziness  Dermatological skin lesions  rashes Respiratory: Cough congestion Urologic: Frequent urination urination at night and hematuria Abdominal: negative for nausea, vomiting, diarrhea, bright red blood per rectum, melena, or hematemesis Neurologic: negative for visual changes, and/or hearing changes  All other systems reviewed and are otherwise negative except as noted above.  Labs: Recent Labs    10/17/18 1134  TROPONINI 0.14*   Lab Results  Component Value Date   WBC 9.8 10/17/2018   HGB 15.6 (H) 10/17/2018   HCT 47.3 (H) 10/17/2018   MCV 89.9 10/17/2018   PLT 145 (L) 10/17/2018    Recent Labs  Lab 10/17/18 1134  NA 130*  K 5.6*  CL 98  CO2 21*  BUN 25*  CREATININE 2.12*  CALCIUM 9.0  GLUCOSE 535*   Lab Results  Component Value Date   CHOL 102 05/15/2013   HDL 11 (L) 05/15/2013   LDLCALC 39 05/15/2013   TRIG 262 (H) 05/15/2013   No results found for: DDIMER  Radiology/Studies:  No results found.  EKG: Normal sinus rhythm with nonspecific ST and T wave changes  Weights: Filed Weights   10/17/18 1100  Weight: 94.3 kg     Physical Exam: Blood pressure (!) 116/57, pulse 69, temperature 98 F (36.7 C), resp. rate 16, height 5\' 9"  (1.753 m), weight 94.3 kg, SpO2 100 %. Body mass index is 30.72 kg/m. General: Well developed, well nourished, in no acute distress. Head eyes ears nose throat: Normocephalic, atraumatic, sclera non-icteric, no xanthomas, nares are without discharge. No apparent thyromegaly and/or mass  Lungs: Normal respiratory effort.  no wheezes, no rales, no rhonchi.  Heart: RRR with normal S1 S2. no murmur gallop, no rub, PMI is normal size and placement, carotid upstroke normal without bruit, jugular venous pressure is normal Abdomen: Soft, non-tender, non-distended with normoactive bowel sounds. No hepatomegaly. No rebound/guarding. No obvious abdominal masses. Abdominal aorta is normal size without bruit Extremities: No edema. no cyanosis, no clubbing, no ulcers   Peripheral : 2+ bilateral upper extremity pulses, 2+ bilateral femoral pulses, 2+ bilateral dorsal pedal pulse Neuro: Alert and oriented. No facial asymmetry. No focal deficit. Moves all extremities spontaneously. Musculoskeletal: Normal muscle tone without kyphosis Psych:  Responds to questions appropriately with a normal affect.    Assessment: 72 year old female with known coronary artery disease status post PCI and stent placement essential hypertension mixed hyperlipidemia diabetes chronic kidney disease on appropriate medications having a fall without evidence of myocardial infarction heart failure at this time  Plan: 1.  Continue serial ECG and enzymes to assess for myocardial infarction need for further intervention at this time 2.  Continue medication management as before for essential hypertension mixed hyperlipidemia and diabetes without change 3.  Continue hydration and follow closely for significant chronic kidney disease exacerbation 4.  Echocardiogram for LV systolic dysfunction valvular heart disease 5.  Further treatment options after above  Signed, Lamar Blinks M.D. [facility] Cardiology 10/17/2018, 1:52 PM

## 2018-10-17 NOTE — ED Notes (Signed)
EDP to bedside to provide patient with updated plan of care. 

## 2018-10-17 NOTE — ED Notes (Signed)
Hospitalist aware of patient CBG, gives ok to continue to 2A.

## 2018-10-17 NOTE — H&P (Signed)
[employer] [facility] [place] [facility]   PATIENT NAME: [name] Munoz    MR#:  [id number]  DATE OF BIRTH:  Nov 18, 1945  DATE OF ADMISSION:  10/17/2018  PRIMARY CARE PHYSICIAN: Delton Prairie, FNP   REQUESTING/REFERRING PHYSICIAN: Dionne Bucy, MD  CHIEF COMPLAINT:   Chief Complaint  Patient presents with  . Hypotension    HISTORY OF PRESENT ILLNESS: [name] Munoz  is a 72 y.o. female with a known history of  Coronary artery disease with previous 2 stents, depression, diabetes, GERD and hypertension who presented to the hospital after a fall.  Patient states that she was trying to get out of her bed when she tripped and fell.  She called EMS when they arrived they noticed her blood pressure was low.  They brought her to the emergency room.  Patient is noted to have abnormal EKG elevated troponin as well as blood sugars which are very high.  Patient currently denying any chest pain or shortness of breath.  She does complaint of intermittent diarrhea which is chronic for her.  ER physician spoke to the on-call cardiologist they recommended IV heparin.    PAST MEDICAL HISTORY:   Past Medical History:  Diagnosis Date  . Coronary artery disease   . Depression   . Diabetes mellitus without complication (HCC)   . GERD (gastroesophageal reflux disease)   . Hypertension     PAST SURGICAL HISTORY:  Past Surgical History:  Procedure Laterality Date  . ESOPHAGOGASTRODUODENOSCOPY N/A 09/20/2015   Procedure: ESOPHAGOGASTRODUODENOSCOPY (EGD);  Surgeon: Scot Jun, MD;  Location: [facility] ENDOSCOPY;  Service: Endoscopy;  Laterality: N/A;  . PERCUTANEOUS CORONARY ROTOBLATOR INTERVENTION (PCI-R)      SOCIAL HISTORY:  Social History   Tobacco Use  . Smoking status: Never Smoker  . Smokeless tobacco: Never Used  Substance Use Topics  . Alcohol use: No    Alcohol/week: 0.0 standard drinks    FAMILY HISTORY:  Family History  Problem Relation Age of Onset  . Diabetes  Mellitus II Maternal Aunt   . Diabetes Mellitus I Other     DRUG ALLERGIES: No Known Allergies  REVIEW OF SYSTEMS:   CONSTITUTIONAL: No fever, fatigue or positive weakness.  EYES: No blurred or double vision.  EARS, NOSE, AND THROAT: No tinnitus or ear pain.  RESPIRATORY: No cough, shortness of breath, wheezing or hemoptysis.  CARDIOVASCULAR: No chest pain, orthopnea, edema.  GASTROINTESTINAL: No nausea, vomiting, diarrhea or abdominal pain.  GENITOURINARY: No dysuria, hematuria.  ENDOCRINE: No polyuria, nocturia,  HEMATOLOGY: No anemia, easy bruising or bleeding SKIN: No rash or lesion. MUSCULOSKELETAL: No joint pain or arthritis.   NEUROLOGIC: No tingling, numbness, weakness.  PSYCHIATRY: No anxiety or depression.   MEDICATIONS AT HOME:  Prior to Admission medications   Medication Sig Start Date End Date Taking? Authorizing Provider  aspirin EC 81 MG tablet Take 81 mg by mouth daily.   Yes [provider]  atorvastatin (LIPITOR) 40 MG tablet Take 40 mg by mouth daily.   Yes [provider]  buPROPion (WELLBUTRIN XL) 150 MG 24 hr tablet Take 150 mg by mouth daily.   Yes [provider]  citalopram (CELEXA) 40 MG tablet Take 40 mg by mouth daily.   Yes [provider]  clopidogrel (PLAVIX) 75 MG tablet Take 75 mg by mouth daily.   Yes [provider]  lisinopril-hydrochlorothiazide (PRINZIDE,ZESTORETIC) 20-25 MG tablet Take 1 tablet by mouth daily.   Yes [provider]  metoprolol succinate (TOPROL-XL)  25 MG 24 hr tablet Take 1 tablet by mouth daily.   Yes [provider]  ondansetron (ZOFRAN) 4 MG tablet Take 1 tablet by mouth every 6 (six) hours as needed. 09/18/15  Yes [provider]  pioglitazone (ACTOS) 15 MG tablet Take 15 mg by mouth daily.   Yes [provider]  polyethylene glycol (MIRALAX / GLYCOLAX) packet Take 17 g by mouth daily as needed for mild constipation.   Yes [provider]  pregabalin (LYRICA) 75 MG capsule Take 75 mg by mouth 3 (three) times daily.   Yes [provider]  ranitidine (ZANTAC) 150 MG tablet Take 150 mg by mouth 2 (two) times daily as needed.    Yes [provider]  ergocalciferol (VITAMIN D2) 50000 UNITS capsule Take 50,000 Units by mouth every 14 (fourteen) days. On the first and third Monday of each month    [provider]  insulin glargine (LANTUS) 100 UNIT/ML injection Inject 30 Units into the skin 2 (two) times daily.     [provider]  traMADol (ULTRAM) 50 MG tablet Take 1 tablet (50 mg total) by mouth every 12 (twelve) hours as needed. Patient not taking: Reported on 10/17/2018 04/24/18   Joni ReiningSmith, Ronald K, PA-C      PHYSICAL EXAMINATION:   VITAL SIGNS: Blood pressure (!) 113/46, pulse 73, temperature 98 F (36.7 C), resp. rate 11, height 5\' 9"  (1.753 m), weight 94.3 kg, SpO2 97 %.  GENERAL:  72 y.o.-year-old patient lying in the bed with no acute distress.  EYES: Pupils equal, round, reactive to light and accommodation. No scleral icterus. Extraocular muscles intact.  HEENT: Head atraumatic, normocephalic. Oropharynx and nasopharynx clear.  NECK:  Supple, no jugular venous distention. No thyroid enlargement, no tenderness.  LUNGS: Normal breath sounds bilaterally, no wheezing, rales,rhonchi or crepitation. No use of accessory muscles of respiration.  CARDIOVASCULAR: S1, S2 normal. No murmurs, rubs, or gallops.  ABDOMEN: Soft, nontender, nondistended. Bowel sounds present. No organomegaly or mass.  EXTREMITIES: No pedal edema, cyanosis, or clubbing.  NEUROLOGIC: Cranial nerves II through XII are intact. Muscle strength 5/5 in all extremities. Sensation intact. Gait not checked.  PSYCHIATRIC: The patient is alert and oriented x 3.  SKIN: No obvious rash, lesion, or ulcer.   LABORATORY PANEL:   CBC Recent Labs  Lab 10/17/18 1134  WBC 9.8  HGB 15.6*  HCT 47.3*  PLT 145*  MCV 89.9  MCH 29.7   MCHC 33.0  RDW 12.6  LYMPHSABS 0.8  MONOABS 0.5  EOSABS 0.0  BASOSABS 0.0   ------------------------------------------------------------------------------------------------------------------  Chemistries  Recent Labs  Lab 10/17/18 1134  NA 130*  K 5.6*  CL 98  CO2 21*  GLUCOSE 535*  BUN 25*  CREATININE 2.12*  CALCIUM 9.0   ------------------------------------------------------------------------------------------------------------------ estimated creatinine clearance is 29.7 mL/min (A) (by C-G formula based on SCr of 2.12 mg/dL (H)). ------------------------------------------------------------------------------------------------------------------ No results for input(s): TSH, T4TOTAL, T3FREE, THYROIDAB in the last 72 hours.  Invalid input(s): FREET3   Coagulation profile Recent Labs  Lab 10/17/18 1134  INR 0.98   ------------------------------------------------------------------------------------------------------------------- No results for input(s): DDIMER in the last 72 hours. -------------------------------------------------------------------------------------------------------------------  Cardiac Enzymes Recent Labs  Lab 10/17/18 1134  TROPONINI 0.14*   ------------------------------------------------------------------------------------------------------------------ Invalid input(s): POCBNP  ---------------------------------------------------------------------------------------------------------------  Urinalysis    Component Value Date/Time   COLORURINE YELLOW (A) 03/07/2018 2238   APPEARANCEUR HAZY (A) 03/07/2018 2238   APPEARANCEUR Cloudy 05/30/2013 2026   LABSPEC 1.023 03/07/2018 2238   LABSPEC 1.014 05/30/2013  2026   PHURINE 6.0 03/07/2018 2238   GLUCOSEU >=500 (A) 03/07/2018 2238   GLUCOSEU 50 mg/dL 01/09/323508/10/2012 [id number]   HGBUR SMALL (A) 03/07/2018 2238   BILIRUBINUR NEGATIVE 03/07/2018 2238   BILIRUBINUR Negative 05/30/2013 2026   KETONESUR 80  (A) 03/07/2018 2238   PROTEINUR 30 (A) 03/07/2018 2238   NITRITE NEGATIVE 03/07/2018 2238   LEUKOCYTESUR TRACE (A) 03/07/2018 2238   LEUKOCYTESUR 3+ 05/30/2013 2026     RADIOLOGY: No results found.  EKG: Orders placed or performed during the hospital encounter of 10/17/18  . EKG 12-Lead  . EKG 12-Lead  . EKG 12-Lead  . EKG 12-Lead    IMPRESSION AND PLAN: Patient is a 72 year old post fall  1.  Elevated troponin we will cycle cardiac enzymes Cardiology has been notified Continue aspirin Plavix Heparin will be initiated  2.  Diabetes type 2 with severe hyperglycemia We will check a hemoglobin A1c Give one-time dose of NovoLog Placed on moderate sensitivity sliding scale  3.  Acute renal failure on chronic kidney disease stage III we will give IV fluids monitor renal function  4.  Hypertension we will hold all antihypertensives in light of hypotension on presentation  5.  Morbid obesity weight loss recommended  6.  Chronic constipation diarrhea with occasional blood in the stool no evidence of GI bleed can use heparin for now we will monitor closely  All the records are reviewed and case discussed with ED provider. Management plans discussed with the patient, family and they are in agreement.  CODE STATUS: Code Status History    Date Active Date Inactive Code Status Order ID Comments User Context   10/19/2015 2041 10/21/2015 1609 Full Code [id number]  Auburn BilberryPatel, Airel Magadan, MD Inpatient   09/18/2015 0628 09/20/2015 1641 Full Code [id number]  Arnaldo Nataliamond, Michael S, MD Inpatient       TOTAL TIME TAKING CARE OF THIS PATIENT: 55minutes.    Auburn BilberryShreyang Antione Obar M.D on 10/17/2018 at 12:45 PM  Between 7am to 6pm - Pager - [phone]  After 6pm go to [email or web address] - password [email or web address]  [employer] [facility] Office  [phone]  CC: Primary care physician; Delton PrairieBrooks, Keatah B, FNP

## 2018-10-17 NOTE — Progress Notes (Signed)
Advanced care plan.  Purpose of the Encounter: CODE STATUS  Parties in Attendance: Patient herself  Patient's Decision Capacity: Intact  Subjective/Patient's story: Patient is a 72 year old with coronary artery disease, diabetes presenting after a fall noted to have elevated troponin and severe hyperglycemia   Objective/Medical story  I discussed with the patient regarding her desires for cardiac and pulmonary resuscitation  Goals of care determination:  Patient states that she would like everything done   CODE STATUS: Full code   Time spent discussing advanced care planning: 16 minutes

## 2018-10-17 NOTE — ED Notes (Signed)
Pt with brief episode bradycardia, down into the 40's.  Pt sitting up in bed, asymptomatic at the time.  Cardiology made aware.

## 2018-10-17 NOTE — ED Notes (Signed)
Attempted to call report, RN unavailable at this time.  Bedside report to be given.

## 2018-10-17 NOTE — Consult Note (Signed)
ANTICOAGULATION CONSULT NOTE - Follow Up Consult  Pharmacy Consult for heparin infusion Indication: chest pain/ACS  No Known Allergies  Patient Measurements: Height: 5\' 9"  (175.3 cm) Weight: 208 lb (94.3 kg) IBW/kg (Calculated) : 66.2 Heparin Dosing Weight: 86.2  Vital Signs: Temp: 98 F (36.7 C) (12/19 1058) BP: 116/57 (12/19 1230) Pulse Rate: 69 (12/19 1230)  Labs: Recent Labs    10/17/18 1134  HGB 15.6*  HCT 47.3*  PLT 145*  LABPROT 12.9  INR 0.98  CREATININE 2.12*  TROPONINI 0.14*    Estimated Creatinine Clearance: 29.7 mL/min (A) (by C-G formula based on SCr of 2.12 mg/dL (H)).   Medications:  (Not in a hospital admission)   Assessment: 72 y.o. female with a known history of  Coronary artery disease with previous 2 stents, depression, diabetes, GERD and hypertension who presented to the hospital after a fall.  Despite fall and documented bloody stool, no acute concern of bleeding risk per primary MD.  Cardiology also recommends initiation of heparin infusion at this time.  Goal of Therapy:  Heparin level 0.3-0.7 units/ml Monitor platelets by anticoagulation protocol: Yes   Plan:  Give 4000 units bolus x 1 Start heparin infusion at 1000 units/hr Check heparin level every 8 hours until two consecutive therapeutic levels then daily and CBC daily while on heparin. Continue to monitor H&H and platelets  Orinda Kennerhris A Maikel Neisler, PharmD Clinical Pharmacist 10/17/2018,1:11 PM

## 2018-10-18 ENCOUNTER — Observation Stay
Admit: 2018-10-18 | Discharge: 2018-10-18 | Disposition: A | Discharge status: Home / Self Care | Payer: Medicare (Managed Care) | Attending: Internal Medicine | Admitting: Internal Medicine

## 2018-10-18 LAB — BASIC METABOLIC PANEL
Anion gap: 8 (ref 5–15)
BUN: 29 mg/dL — ABNORMAL HIGH (ref 8–23)
CHLORIDE: 104 mmol/L (ref 98–111)
CO2: 21 mmol/L — ABNORMAL LOW (ref 22–32)
Calcium: 8.2 mg/dL — ABNORMAL LOW (ref 8.9–10.3)
Creatinine, Ser: 2.18 mg/dL — ABNORMAL HIGH (ref 0.44–1.00)
GFR calc Af Amer: 26 mL/min — ABNORMAL LOW (ref >60)
GFR calc non Af Amer: 22 mL/min — ABNORMAL LOW (ref >60)
Glucose, Bld: 89 mg/dL (ref 70–99)
Potassium: 3.8 mmol/L (ref 3.5–5.1)
Sodium: 133 mmol/L — ABNORMAL LOW (ref 135–145)

## 2018-10-18 LAB — URINALYSIS, COMPLETE (UACMP) WITH MICROSCOPIC
Bilirubin Urine: NEGATIVE
Glucose, UA: >=500 mg/dL — AB
Hgb urine dipstick: NEGATIVE
KETONES UR: NEGATIVE mg/dL
NITRITE: NEGATIVE
Protein, ur: NEGATIVE mg/dL
SPECIFIC GRAVITY, URINE: 1.011 (ref 1.005–1.030)
pH: 5 (ref 5.0–8.0)

## 2018-10-18 LAB — GLUCOSE, CAPILLARY
GLUCOSE-CAPILLARY: 193 mg/dL — AB (ref 70–99)
Glucose-Capillary: 67 mg/dL — ABNORMAL LOW (ref 70–99)
Glucose-Capillary: 80 mg/dL (ref 70–99)
Glucose-Capillary: 178 mg/dL — ABNORMAL HIGH (ref 70–99)

## 2018-10-18 LAB — CBC
HCT: 42.2 % (ref 36.0–46.0)
Hemoglobin: 13.6 g/dL (ref 12.0–15.0)
MCH: 29 pg (ref 26.0–34.0)
MCHC: 32.2 g/dL (ref 30.0–36.0)
MCV: 90 fL (ref 80.0–100.0)
Platelets: 137 10*3/uL — ABNORMAL LOW (ref 150–400)
RBC: 4.69 MIL/uL (ref 3.87–5.11)
RDW: 12.6 % (ref 11.5–15.5)
WBC: 8.2 10*3/uL (ref 4.0–10.5)
nRBC: 0 % (ref 0.0–0.2)

## 2018-10-18 LAB — HEPARIN LEVEL (UNFRACTIONATED): HEPARIN UNFRACTIONATED: 0.6 [IU]/mL (ref 0.30–0.70)

## 2018-10-18 LAB — TROPONIN I
TROPONIN I: 0.09 ng/mL — AB (ref <0.03)
TROPONIN I: 0.05 ng/mL — AB (ref <0.03)
Troponin I: 0.06 ng/mL (ref <0.03)

## 2018-10-18 MED ORDER — DEXTROSE 50 % IV SOLN: 50.0000 mL | INTRAVENOUS | Status: DC | PRN

## 2018-10-18 MED ORDER — GLUCAGON HCL RDNA (DIAGNOSTIC) 1 MG IJ SOLR: 1.0000 mg | Freq: Once | INTRAMUSCULAR | Status: DC | PRN

## 2018-10-18 MED ORDER — SODIUM CHLORIDE 0.9 % IV BOLUS
1000.0000 mL | Freq: Once | INTRAVENOUS | Status: AC
  Administered 2018-10-18: 1000 mL via INTRAVENOUS

## 2018-10-18 NOTE — Progress Notes (Signed)
Patient given discharge instructions. Patient verbalized understanding with no questions on concerns. IV's taken out and tele monitor off. [facility] program to provide transportation to take patient home. Per [facility] nurse, transportation will be here at 1300. Patient currently eating her lunch. Will be discharging around 1300.

## 2018-10-18 NOTE — Progress Notes (Signed)
[facility] [place], [place] 10/18/18  Subjective:   Patient known to our practice from 2016 when she was seen in the hospital for acute renal failure.  She has known history of longstanding diabetes which is poorly controlled.  She was brought in this time for a fall.  She states she could not get up therefore EMS had to come and help her.  Patient has had variable blood pressures with 1 documented hypotension of 88/41.  Home medication regimen includes lisinopril/HCTZ  Objective:  Vital signs in last 24 hours:  Temp:  [97.3 F (36.3 C)-98 F (36.7 C)] 97.3 F (36.3 C) (12/20 0730) Pulse Rate:  [65-76] 70 (12/20 0730) Resp:  [11-16] 16 (12/20 0346) BP: (88-153)/(33-132) 114/53 (12/20 0730) SpO2:  [97 %-100 %] 100 % (12/20 0730) Weight:  [94.3 kg-99.9 kg] 99.9 kg (12/20 0346)  Weight change:  Filed Weights   10/17/18 1100 10/18/18 0346  Weight: 94.3 kg 99.9 kg    Intake/Output:    Intake/Output Summary (Last 24 hours) at 10/18/2018 1001 Last data filed at 10/18/2018 0750 Gross per 24 hour  Intake 2975.29 ml  Output 600 ml  Net 2375.29 ml     Physical Exam: General:  Sitting up in the bed, eating breakfast  HEENT  anicteric, moist oral mucous membranes  Neck  supple  Pulm/lungs  normal breathing effort, clear to auscultation  CVS/Heart  regular, no rub or gallop  Abdomen:   Soft  Extremities:  No edema  Neurologic:  Alert, oriented  Skin:  No acute rashes    Basic Metabolic Panel:  Recent Labs  Lab 10/17/18 1134 10/18/18 0118  NA 130* 133*  K 5.6* 3.8  CL 98 104  CO2 21* 21*  GLUCOSE 535* 89  BUN 25* 29*  CREATININE 2.12* 2.18*  CALCIUM 9.0 8.2*     CBC: Recent Labs  Lab 10/17/18 1134 10/18/18 0118  WBC 9.8 8.2  NEUTROABS 8.4*  --   HGB 15.6* 13.6  HCT 47.3* 42.2  MCV 89.9 90.0  PLT 145* 137*     No results found for: HEPBSAG, HEPBSAB, HEPBIGM    Microbiology:  No results found for this or any previous visit (from  the past 240 hour(s)).  Coagulation Studies: Recent Labs    10/17/18 1134  LABPROT 12.9  INR 0.98    Urinalysis: Recent Labs    10/17/18 0751  COLORURINE YELLOW*  LABSPEC 1.011  PHURINE 5.0  GLUCOSEU >=500*  HGBUR NEGATIVE  BILIRUBINUR NEGATIVE  KETONESUR NEGATIVE  PROTEINUR NEGATIVE  NITRITE NEGATIVE  LEUKOCYTESUR MODERATE*      Imaging: No results found.   Medications:   . sodium chloride 100 mL/hr at 10/17/18 1754   . aspirin EC  81 mg Oral Daily  . atorvastatin  40 mg Oral Daily  . buPROPion  150 mg Oral Daily  . citalopram  40 mg Oral Daily  . clopidogrel  75 mg Oral Daily  . famotidine  20 mg Oral BID  . insulin aspart  0-15 Units Subcutaneous TID WC  . insulin aspart  4 Units Subcutaneous TID WC  . insulin glargine  30 Units Subcutaneous BID  . pioglitazone  15 mg Oral Daily  . pregabalin  75 mg Oral TID   acetaminophen **OR** acetaminophen, dextrose, glucagon (human recombinant), ondansetron **OR** ondansetron (ZOFRAN) IV, ondansetron, polyethylene glycol  Assessment/ Plan:  72 y.o. Caucasian female with longstanding diabetes, poorly controlled, coronary disease, hypertension, GERD Patient is followed by [facility]  1.  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  She also had IV contrast exposure for a CT of the abdomen.  It is possible that renal disease has progressed after that IV contrast exposure however no lab results are available since then.  Other differential includes acute kidney injury/ATN from hypotension We plan to obtain renal ultrasound.  Follow-up patient in the outpatient setting in about 2 weeks  2.  Hypertension with CKD Blood pressure control has been variable and is relatively low at present without needing antihypertensives If blood pressure remains low/normal, she can be discharged without HCTZ/lisinopril.  We can  consider restarting lisinopril as outpatient as necessary.  3. Diabetes type 2/CKD Diabetes is poorly controlled.  Most recent hemoglobin A1c is 12.1%.      LOS: 0 Marchelle Rinella 12/20/201910:01 AM  [facility] [place], [place] [phone]  Note: This note was prepared with Dragon dictation. Any transcription errors are unintentional

## 2018-10-18 NOTE — Clinical Social Work Note (Signed)
[facility]  Case discussed with case manager and plan is to discharge home with home health.  CSW to sign off please re-consult if social work needs arise.  Ervin KnackEric R. Revan Gendron, MSW, [employer]CSWA [phone]

## 2018-10-18 NOTE — Consult Note (Signed)
ANTICOAGULATION CONSULT NOTE - Follow Up Consult  Pharmacy Consult for heparin infusion Indication: chest pain/ACS  No Known Allergies  Patient Measurements: Height: 5\' 9"  (175.3 cm) Weight: 220 lb 3.2 oz (99.9 kg) IBW/kg (Calculated) : 66.2 Heparin Dosing Weight: 86.2  Vital Signs: Temp: 97.3 F (36.3 C) (12/20 0730) Temp Source: Oral (12/20 0730) BP: 114/53 (12/20 0730) Pulse Rate: 70 (12/20 0730)  Labs: Recent Labs    10/17/18 1134  10/17/18 1445 10/17/18 1928 10/17/18 2154 10/18/18 0118 10/18/18 0704  HGB 15.6*  --   --   --   --  13.6  --   HCT 47.3*  --   --   --   --  42.2  --   PLT 145*  --   --   --   --  137*  --   APTT 24  --   --   --   --   --   --   LABPROT 12.9  --   --   --   --   --   --   INR 0.98  --   --   --   --   --   --   HEPARINUNFRC  --   --   --   --  0.62  --  0.60  CREATININE 2.12*  --   --   --   --  2.18*  --   TROPONINI 0.14*   < > 0.08* 0.08*  --  0.09*  --    < > = values in this interval not displayed.    Estimated Creatinine Clearance: 29.8 mL/min (A) (by C-G formula based on SCr of 2.18 mg/dL (H)).   Medications:  Medications Prior to Admission  Medication Sig Dispense Refill Last Dose  . aspirin EC 81 MG tablet Take 81 mg by mouth daily.   10/16/2018 at 0700  . atorvastatin (LIPITOR) 40 MG tablet Take 40 mg by mouth daily.   10/16/2018 at 0700  . buPROPion (WELLBUTRIN XL) 150 MG 24 hr tablet Take 150 mg by mouth daily.   10/16/2018 at 0700  . citalopram (CELEXA) 40 MG tablet Take 40 mg by mouth daily.   10/16/2018 at 0700  . clopidogrel (PLAVIX) 75 MG tablet Take 75 mg by mouth daily.   10/16/2018 at 0700  . lisinopril-hydrochlorothiazide (PRINZIDE,ZESTORETIC) 20-25 MG tablet Take 1 tablet by mouth daily.   10/16/2018 at 0700  . metoprolol succinate (TOPROL-XL) 25 MG 24 hr tablet Take 1 tablet by mouth daily.   10/16/2018 at 0700  . ondansetron (ZOFRAN) 4 MG tablet Take 1 tablet by mouth every 6 (six) hours as needed.   prn  at prn  . pioglitazone (ACTOS) 15 MG tablet Take 15 mg by mouth daily.   10/16/2018 at 0700  . polyethylene glycol (MIRALAX / GLYCOLAX) packet Take 17 g by mouth daily as needed for mild constipation.   Past Week at prn  . pregabalin (LYRICA) 75 MG capsule Take 75 mg by mouth 3 (three) times daily.   10/16/2018 at 2000  . ranitidine (ZANTAC) 150 MG tablet Take 150 mg by mouth 2 (two) times daily as needed.    10/16/2018 at 2000  . ergocalciferol (VITAMIN D2) 50000 UNITS capsule Take 50,000 Units by mouth every 14 (fourteen) days. On the first and third Monday of each month   Completed Course at Unknown time  . insulin glargine (LANTUS) 100 UNIT/ML injection Inject 30 Units into the skin 2 (two) times  daily.    Not Taking at Unknown time  . traMADol (ULTRAM) 50 MG tablet Take 1 tablet (50 mg total) by mouth every 12 (twelve) hours as needed. (Patient not taking: Reported on 10/17/2018) 12 tablet 0 Completed Course at Unknown time    Assessment: 72 y.o. female with a known history of  Coronary artery disease with previous 2 stents, depression, diabetes, GERD and hypertension who presented to the hospital after a fall.  Despite fall and documented bloody stool, no acute concern of bleeding risk per primary MD.  Cardiology also recommends initiation of heparin infusion at this time.  Goal of Therapy:  Heparin level 0.3-0.7 units/ml Monitor platelets by anticoagulation protocol: Yes   Plan:  12/19 @ 0730 HL 0.60 therapeutic. Will continue current rate and recheck HL w/ am labs. CBC check w/ am labs.  [name] Munoz, PharmD, BCPS Clinical Pharmacist 10/18/2018 7:34 AM

## 2018-10-18 NOTE — Progress Notes (Signed)
[facility]  Patient: [name] Munoz / Admit Date: 10/17/2018 / Date of Encounter: 10/18/2018, 8:45 AM   Subjective: Patient very sleepy today due to restless night and some hypotension.  Patient has had waxing and waning glucose.  Patient is recovering from fall without evidence of major injury.  Patient has had telemetry showing normal sinus rhythm with bundle branch block.  Troponin has been peaked at 2.14 but steady at 0.09 multifactorial in nature possibly due to demand ischemia.  No current evidence of acute coronary syndrome.  Review of Systems: Positive for: Weakness sleepiness Negative for: Vision change, hearing change, syncope, dizziness, nausea, vomiting,diarrhea, bloody stool, stomach pain, cough, congestion, diaphoresis, urinary frequency, urinary pain,skin lesions, skin rashes Others previously listed  Objective: Telemetry: Sinus rhythm Physical Exam: Blood pressure (!) 114/53, pulse 70, temperature (!) 97.3 F (36.3 C), temperature source Oral, resp. rate 16, height 5\' 9"  (1.753 m), weight 99.9 kg, SpO2 100 %. Body mass index is 32.52 kg/m. General: Well developed, well nourished, in no acute distress. Head: Normocephalic, atraumatic, sclera non-icteric, no xanthomas, nares are without discharge. Neck: No apparent masses Lungs: Normal respirations with no wheezes, no rhonchi, no rales , no crackles   Heart: Regular rate and rhythm, normal S1 S2, no murmur, no rub, no gallop, PMI is normal size and placement, carotid upstroke normal without bruit, jugular venous pressure normal Abdomen: Soft, non-tender, non-distended with normoactive bowel sounds. No hepatosplenomegaly. Abdominal aorta is normal size without bruit Extremities: No edema, no clubbing, no cyanosis, no ulcers,  Peripheral: 2+ radial, 2+ femoral, 2+ dorsal pedal pulses Neuro: Alert and oriented. Moves all extremities spontaneously. Psych:  Responds to questions appropriately  with a normal affect.   Intake/Output Summary (Last 24 hours) at 10/18/2018 0845 Last data filed at 10/18/2018 0750 Gross per 24 hour  Intake 2975.29 ml  Output 600 ml  Net 2375.29 ml    Inpatient Medications:  . aspirin EC  81 mg Oral Daily  . atorvastatin  40 mg Oral Daily  . buPROPion  150 mg Oral Daily  . citalopram  40 mg Oral Daily  . clopidogrel  75 mg Oral Daily  . famotidine  20 mg Oral BID  . insulin aspart  0-15 Units Subcutaneous TID WC  . insulin aspart  4 Units Subcutaneous TID WC  . insulin glargine  30 Units Subcutaneous BID  . pioglitazone  15 mg Oral Daily  . pregabalin  75 mg Oral TID   Infusions:  . sodium chloride 100 mL/hr at 10/17/18 1754    Labs: Recent Labs    10/17/18 1134 10/18/18 0118  NA 130* 133*  K 5.6* 3.8  CL 98 104  CO2 21* 21*  GLUCOSE 535* 89  BUN 25* 29*  CREATININE 2.12* 2.18*  CALCIUM 9.0 8.2*   No results for input(s): AST, ALT, ALKPHOS, BILITOT, PROT, ALBUMIN in the last 72 hours. Recent Labs    10/17/18 1134 10/18/18 0118  WBC 9.8 8.2  NEUTROABS 8.4*  --   HGB 15.6* 13.6  HCT 47.3* 42.2  MCV 89.9 90.0  PLT 145* 137*   Recent Labs    10/17/18 1445 10/17/18 1928 10/18/18 0118 10/18/18 0704  TROPONINI 0.08* 0.08* 0.09* 0.06*   Invalid input(s): POCBNP Recent Labs    10/17/18 1311  HGBA1C 12.1*     Weights: Filed Weights   10/17/18 1100 10/18/18 0346  Weight: 94.3 kg 99.9 kg     Radiology/Studies:  No results found.  Assessment and Recommendation  72 y.o. female with known coronary disease status post previous stenting essential hypertension mixed hyperlipidemia diabetes and chronic kidney disease stage IV with dehydration weakness fatigue and fall with minimal elevation of troponin possible demand ischemia 1.  Continue supportive care for hydration and low blood pressure 2.  Echocardiogram for LV systolic dysfunction valvular heart disease contributing to above 3.  Abstain from any  antihypertensive medication management due to concerns of chronic kidney disease as well as hypotension causing current symptoms 4.  High intensity cholesterol therapy 5.  Begin ambulation follow-up for further significant symptoms and possible further intervention if anginal symptoms occur  Signed, Arnoldo HookerBruce Raheen Capili M.D. FACC

## 2018-10-18 NOTE — Progress Notes (Signed)
Inpatient Diabetes Program Recommendations  AACE/ADA: New Consensus Statement on Inpatient Glycemic Control (2015)  Target Ranges:  Prepandial:   less than 140 mg/dL      Peak postprandial:   less than 180 mg/dL (1-2 hours)      Critically ill patients:  140 - 180 mg/dL   Lab Results  Component Value Date   GLUCAP 178 (H) 10/18/2018   HGBA1C 12.1 (H) 10/17/2018    Review of Glycemic Control Results for [name] NeatCATES, [name] Munoz (MRN [id number]) as of 10/18/2018 09:24  Ref. Range 10/17/2018 14:37 10/17/2018 16:43 10/17/2018 21:26 10/18/2018 04:18 10/18/2018 04:41 10/18/2018 07:34  Glucose-Capillary Latest Ref Range: 70 - 99 mg/dL 629423 (H) 528272 (H) 413152 (H) 67 (L) 80 178 (H)   Diabetes history: DM 2 Outpatient Diabetes medications: Lantus 30 units bid, Actos 15 mg daily  Current orders for Inpatient glycemic control:  Lantus 30 units bid, Actos 15 mg daily, Novolog moderate tid with meals, Novolog 4 units tid with meals  Inpatient Diabetes Program Recommendations:   Agree with current orders.  A1C indicates poor control of DM.  Blood sugars likely reduced due to large amounts of SQ Novolog given on 12/19.  Consider slight reduction of Lantus to 25 units bid.  Will follow.   Thanks,  Beryl MeagerJenny Develle Sievers, RN, BC-ADM Inpatient Diabetes Coordinator Pager [phone] (8a-5p)

## 2018-10-18 NOTE — Discharge Summary (Signed)
[facility] [place] [facility]   PATIENT NAME: [name] Munoz    MR#:  [id number]  DATE OF BIRTH:  January 28, 1946  DATE OF ADMISSION:  10/17/2018 ADMITTING PHYSICIAN: Auburn Bilberry, MD  DATE OF DISCHARGE: 10/18/2018  PRIMARY CARE PHYSICIAN: Delton Prairie, FNP    ADMISSION DIAGNOSIS:  Acute renal insufficiency [N28.9] Hyperglycemia [R73.9] Non-STEMI (non-ST elevated myocardial infarction) (HCC) [I21.4]  DISCHARGE DIAGNOSIS:  Active Problems:   Hypotension   SECONDARY DIAGNOSIS:   Past Medical History:  Diagnosis Date  . Coronary artery disease   . Depression   . Diabetes mellitus without complication (HCC)   . GERD (gastroesophageal reflux disease)   . Hypertension     HOSPITAL COURSE:  72 year old female with history of diabetes, CKD stage III, CAD and hypertension who presented to the hospital after a fall and was found to have low blood pressure.   1.  Hypotension: Patient's blood pressure has responded to fluids.  She was eval by cardiology.  Recommendations are to discontinue blood pressure medications at this time and have outpatient follow-up and these may be restarted as an outpatient by her primary care physician.  Her blood pressure is stable and is normotensive.  2.  Elevated troponin: Patient was eval by cardiology.  She has ruled out for ACS.  Her troponins are flat and are due to demand ischemia from hypotension.   3.  Diabetes: Patient needs really close follow-up for her diabetes.  She will follow-up with her PCP  4.  Acute on chronic kidney disease stage IV: Patient evaluated by nephrology Creatinine has improved.  Patient will need outpatient follow-up.  She needs to discontinue any nephrotoxic medications.    DISCHARGE CONDITIONS AND DIET:   Stable for discharge on diabetic diet  CONSULTS OBTAINED:  Treatment Team:  Lamar Blinks, MD Mosetta Pigeon, MD  DRUG ALLERGIES:  No Known Allergies  DISCHARGE MEDICATIONS:    Allergies as of 10/18/2018   No Known Allergies     Medication List    STOP taking these medications   lisinopril-hydrochlorothiazide 20-25 MG tablet Commonly known as:  PRINZIDE,ZESTORETIC   metoprolol succinate 25 MG 24 hr tablet Commonly known as:  TOPROL-XL   traMADol 50 MG tablet Commonly known as:  ULTRAM     TAKE these medications   aspirin EC 81 MG tablet Take 81 mg by mouth daily.   atorvastatin 40 MG tablet Commonly known as:  LIPITOR Take 40 mg by mouth daily.   buPROPion 150 MG 24 hr tablet Commonly known as:  WELLBUTRIN XL Take 150 mg by mouth daily.   citalopram 40 MG tablet Commonly known as:  CELEXA Take 40 mg by mouth daily.   clopidogrel 75 MG tablet Commonly known as:  PLAVIX Take 75 mg by mouth daily.   ergocalciferol 1.25 MG (50000 UT) capsule Commonly known as:  VITAMIN D2 Take 50,000 Units by mouth every 14 (fourteen) days. On the first and third Monday of each month   insulin glargine 100 UNIT/ML injection Commonly known as:  LANTUS Inject 30 Units into the skin 2 (two) times daily.   ondansetron 4 MG tablet Commonly known as:  ZOFRAN Take 1 tablet by mouth every 6 (six) hours as needed.   pioglitazone 15 MG tablet Commonly known as:  ACTOS Take 15 mg by mouth daily.   polyethylene glycol packet Commonly known as:  MIRALAX / GLYCOLAX Take 17 g by mouth daily as needed for mild constipation.   pregabalin 75  MG capsule Commonly known as:  LYRICA Take 75 mg by mouth 3 (three) times daily.   ranitidine 150 MG tablet Commonly known as:  ZANTAC Take 150 mg by mouth 2 (two) times daily as needed.         Today   CHIEF COMPLAINT:   Patient doing well this morning.  Denies chest pain, dizziness or lightheadedness.  Would like to go home.   VITAL SIGNS:  Blood pressure (!) 114/53, pulse 70, temperature (!) 97.3 F (36.3 C), temperature source Oral, resp. rate 16, height 5\' 9"  (1.753 m), weight 99.9 kg, SpO2 100  %.   REVIEW OF SYSTEMS:  Review of Systems  Constitutional: Negative.  Negative for chills, fever and malaise/fatigue.  HENT: Negative.  Negative for ear discharge, ear pain, hearing loss, nosebleeds and sore throat.   Eyes: Negative.  Negative for blurred vision and pain.  Respiratory: Negative.  Negative for cough, hemoptysis, shortness of breath and wheezing.   Cardiovascular: Negative.  Negative for chest pain, palpitations and leg swelling.  Gastrointestinal: Negative.  Negative for abdominal pain, blood in stool, diarrhea, nausea and vomiting.  Genitourinary: Negative.  Negative for dysuria.  Musculoskeletal: Negative.  Negative for back pain.  Skin: Negative.   Neurological: Negative for dizziness, tremors, speech change, focal weakness, seizures and headaches.  Endo/Heme/Allergies: Negative.  Does not bruise/bleed easily.  Psychiatric/Behavioral: Negative.  Negative for depression, hallucinations and suicidal ideas.     PHYSICAL EXAMINATION:  GENERAL:  72 y.o.-year-old patient lying in the bed with no acute distress.  NECK:  Supple, no jugular venous distention. No thyroid enlargement, no tenderness.  LUNGS: Normal breath sounds bilaterally, no wheezing, rales,rhonchi  No use of accessory muscles of respiration.  CARDIOVASCULAR: S1, S2 normal. No murmurs, rubs, or gallops.  ABDOMEN: Soft, non-tender, non-distended. Bowel sounds present. No organomegaly or mass.  EXTREMITIES: No pedal edema, cyanosis, or clubbing.  PSYCHIATRIC: The patient is alert and oriented x 3.  SKIN: No obvious rash, lesion, or ulcer.   DATA REVIEW:   CBC Recent Labs  Lab 10/18/18 0118  WBC 8.2  HGB 13.6  HCT 42.2  PLT 137*    Chemistries  Recent Labs  Lab 10/18/18 0118  NA 133*  K 3.8  CL 104  CO2 21*  GLUCOSE 89  BUN 29*  CREATININE 2.18*  CALCIUM 8.2*    Cardiac Enzymes Recent Labs  Lab 10/17/18 1928 10/18/18 0118 10/18/18 0704  TROPONINI 0.08* 0.09* 0.06*     Microbiology Results  @[email or web address] @  RADIOLOGY:  No results found.    Allergies as of 10/18/2018   No Known Allergies     Medication List    STOP taking these medications   lisinopril-hydrochlorothiazide 20-25 MG tablet Commonly known as:  PRINZIDE,ZESTORETIC   metoprolol succinate 25 MG 24 hr tablet Commonly known as:  TOPROL-XL   traMADol 50 MG tablet Commonly known as:  ULTRAM     TAKE these medications   aspirin EC 81 MG tablet Take 81 mg by mouth daily.   atorvastatin 40 MG tablet Commonly known as:  LIPITOR Take 40 mg by mouth daily.   buPROPion 150 MG 24 hr tablet Commonly known as:  WELLBUTRIN XL Take 150 mg by mouth daily.   citalopram 40 MG tablet Commonly known as:  CELEXA Take 40 mg by mouth daily.   clopidogrel 75 MG tablet Commonly known as:  PLAVIX Take 75 mg by mouth daily.   ergocalciferol 1.25 MG (50000 UT) capsule Commonly known as:  VITAMIN D2 Take 50,000 Units by mouth every 14 (fourteen) days. On the first and third Monday of each month   insulin glargine 100 UNIT/ML injection Commonly known as:  LANTUS Inject 30 Units into the skin 2 (two) times daily.   ondansetron 4 MG tablet Commonly known as:  ZOFRAN Take 1 tablet by mouth every 6 (six) hours as needed.   pioglitazone 15 MG tablet Commonly known as:  ACTOS Take 15 mg by mouth daily.   polyethylene glycol packet Commonly known as:  MIRALAX / GLYCOLAX Take 17 g by mouth daily as needed for mild constipation.   pregabalin 75 MG capsule Commonly known as:  LYRICA Take 75 mg by mouth 3 (three) times daily.   ranitidine 150 MG tablet Commonly known as:  ZANTAC Take 150 mg by mouth 2 (two) times daily as needed.          Management plans discussed with the patient and she is in agreement. Stable for discharge home  Patient should follow up with [facility]  CODE STATUS:     Code Status Orders  (From admission, onward)         Start     Ordered   10/17/18  1421  Full code  Continuous     10/17/18 1421        Code Status History    Date Active Date Inactive Code Status Order ID Comments User Context   10/19/2015 2041 10/21/2015 1609 Full Code [id number]  Auburn BilberryPatel, Shreyang, MD Inpatient   09/18/2015 0628 09/20/2015 1641 Full Code [id number]  Arnaldo Nataliamond, Michael S, MD Inpatient      TOTAL TIME TAKING CARE OF THIS PATIENT: 38 minutes.    Note: This dictation was prepared with Dragon dictation along with smaller phrase technology. Any transcriptional errors that result from this process are unintentional.  Grazia Taffe M.D on 10/18/2018 at 10:51 AM  Between 7am to 6pm - Pager - [phone] After 6pm go to [email or web address] - Social research officer, governmentpassword [email or web address] [email or web address]  [facility]  Office  [phone]  CC: Primary care physician; Delton PrairieBrooks, Keatah B, FNP

## 2018-10-18 NOTE — Progress Notes (Signed)
*  PRELIMINARY RESULTS* Echocardiogram 2D Echocardiogram has been performed.  Cristela BlueHege, Adilenne Ashworth 10/18/2018, 9:40 AM

## 2018-10-18 NOTE — Plan of Care (Signed)

## 2018-10-18 NOTE — Consult Note (Signed)
ANTICOAGULATION CONSULT NOTE - Follow Up Consult  Pharmacy Consult for heparin infusion Indication: chest pain/ACS  No Known Allergies  Patient Measurements: Height: 5\' 9"  (175.3 cm) Weight: 208 lb (94.3 kg) IBW/kg (Calculated) : 66.2 Heparin Dosing Weight: 86.2  Vital Signs: Temp: 97.3 F (36.3 C) (12/19 1642) Temp Source: Oral (12/19 1642) BP: 105/33 (12/19 2002) Pulse Rate: 65 (12/19 2002)  Labs: Recent Labs    10/17/18 1134 10/17/18 1311 10/17/18 1445 10/17/18 1928 10/17/18 2154  HGB 15.6*  --   --   --   --   HCT 47.3*  --   --   --   --   PLT 145*  --   --   --   --   APTT 24  --   --   --   --   LABPROT 12.9  --   --   --   --   INR 0.98  --   --   --   --   HEPARINUNFRC  --   --   --   --  0.62  CREATININE 2.12*  --   --   --   --   TROPONINI 0.14* 0.09* 0.08* 0.08*  --     Estimated Creatinine Clearance: 29.7 mL/min (A) (by C-G formula based on SCr of 2.12 mg/dL (H)).   Medications:  Medications Prior to Admission  Medication Sig Dispense Refill Last Dose  . aspirin EC 81 MG tablet Take 81 mg by mouth daily.   10/16/2018 at 0700  . atorvastatin (LIPITOR) 40 MG tablet Take 40 mg by mouth daily.   10/16/2018 at 0700  . buPROPion (WELLBUTRIN XL) 150 MG 24 hr tablet Take 150 mg by mouth daily.   10/16/2018 at 0700  . citalopram (CELEXA) 40 MG tablet Take 40 mg by mouth daily.   10/16/2018 at 0700  . clopidogrel (PLAVIX) 75 MG tablet Take 75 mg by mouth daily.   10/16/2018 at 0700  . lisinopril-hydrochlorothiazide (PRINZIDE,ZESTORETIC) 20-25 MG tablet Take 1 tablet by mouth daily.   10/16/2018 at 0700  . metoprolol succinate (TOPROL-XL) 25 MG 24 hr tablet Take 1 tablet by mouth daily.   10/16/2018 at 0700  . ondansetron (ZOFRAN) 4 MG tablet Take 1 tablet by mouth every 6 (six) hours as needed.   prn at prn  . pioglitazone (ACTOS) 15 MG tablet Take 15 mg by mouth daily.   10/16/2018 at 0700  . polyethylene glycol (MIRALAX / GLYCOLAX) packet Take 17 g by mouth  daily as needed for mild constipation.   Past Week at prn  . pregabalin (LYRICA) 75 MG capsule Take 75 mg by mouth 3 (three) times daily.   10/16/2018 at 2000  . ranitidine (ZANTAC) 150 MG tablet Take 150 mg by mouth 2 (two) times daily as needed.    10/16/2018 at 2000  . ergocalciferol (VITAMIN D2) 50000 UNITS capsule Take 50,000 Units by mouth every 14 (fourteen) days. On the first and third Monday of each month   Completed Course at Unknown time  . insulin glargine (LANTUS) 100 UNIT/ML injection Inject 30 Units into the skin 2 (two) times daily.    Not Taking at Unknown time  . traMADol (ULTRAM) 50 MG tablet Take 1 tablet (50 mg total) by mouth every 12 (twelve) hours as needed. (Patient not taking: Reported on 10/17/2018) 12 tablet 0 Completed Course at Unknown time    Assessment: 72 y.o. female with a known history of  Coronary artery disease with  previous 2 stents, depression, diabetes, GERD and hypertension who presented to the hospital after a fall.  Despite fall and documented bloody stool, no acute concern of bleeding risk per primary MD.  Cardiology also recommends initiation of heparin infusion at this time.  Goal of Therapy:  Heparin level 0.3-0.7 units/ml Monitor platelets by anticoagulation protocol: Yes   Plan:  12/19 @ 2200 HL 0.62 therapeutic. Will continue current rate and recheck HL w/ am labs. CBC check w/ am labs.  Thomasene Rippleavid  [place], PharmD Clinical Pharmacist 10/18/2018,1:30 AM

## 2018-10-18 NOTE — Plan of Care (Signed)

## 2018-10-19 LAB — ECHOCARDIOGRAM COMPLETE
Height: 69 in
Weight: 3523.2 oz

## 2019-05-15 ENCOUNTER — Emergency Department: Payer: Medicare (Managed Care)

## 2019-05-15 ENCOUNTER — Inpatient Hospital Stay
Admission: EM | Admit: 2019-05-15 | Discharge: 2019-05-19 | DRG: 493 | Disposition: A | Discharge status: Skilled Nursing Facility | Payer: Medicare (Managed Care) | Attending: Nurse Practitioner | Admitting: Nurse Practitioner

## 2019-05-15 ENCOUNTER — Other Ambulatory Visit: Payer: Self-pay

## 2019-05-15 DIAGNOSIS — S82141A Displaced bicondylar fracture of right tibia, initial encounter for closed fracture: Secondary | ICD-10-CM | POA: Diagnosis present

## 2019-05-15 DIAGNOSIS — S82209A Unspecified fracture of shaft of unspecified tibia, initial encounter for closed fracture: Secondary | ICD-10-CM | POA: Diagnosis present

## 2019-05-15 DIAGNOSIS — Z79899 Other long term (current) drug therapy: Secondary | ICD-10-CM | POA: Diagnosis not present

## 2019-05-15 DIAGNOSIS — X501XXA Overexertion from prolonged static or awkward postures, initial encounter: Secondary | ICD-10-CM | POA: Diagnosis not present

## 2019-05-15 DIAGNOSIS — E785 Hyperlipidemia, unspecified: Secondary | ICD-10-CM | POA: Diagnosis present

## 2019-05-15 DIAGNOSIS — S8264XA Nondisplaced fracture of lateral malleolus of right fibula, initial encounter for closed fracture: Secondary | ICD-10-CM | POA: Diagnosis present

## 2019-05-15 DIAGNOSIS — Z955 Presence of coronary angioplasty implant and graft: Secondary | ICD-10-CM | POA: Diagnosis not present

## 2019-05-15 DIAGNOSIS — Z794 Long term (current) use of insulin: Secondary | ICD-10-CM | POA: Diagnosis not present

## 2019-05-15 DIAGNOSIS — Z833 Family history of diabetes mellitus: Secondary | ICD-10-CM

## 2019-05-15 DIAGNOSIS — I129 Hypertensive chronic kidney disease with stage 1 through stage 4 chronic kidney disease, or unspecified chronic kidney disease: Secondary | ICD-10-CM | POA: Diagnosis present

## 2019-05-15 DIAGNOSIS — I2510 Atherosclerotic heart disease of native coronary artery without angina pectoris: Secondary | ICD-10-CM | POA: Diagnosis present

## 2019-05-15 DIAGNOSIS — W010XXA Fall on same level from slipping, tripping and stumbling without subsequent striking against object, initial encounter: Secondary | ICD-10-CM | POA: Diagnosis present

## 2019-05-15 DIAGNOSIS — Z7902 Long term (current) use of antithrombotics/antiplatelets: Secondary | ICD-10-CM | POA: Diagnosis not present

## 2019-05-15 DIAGNOSIS — T148XXA Other injury of unspecified body region, initial encounter: Secondary | ICD-10-CM | POA: Diagnosis present

## 2019-05-15 DIAGNOSIS — F329 Major depressive disorder, single episode, unspecified: Secondary | ICD-10-CM | POA: Diagnosis present

## 2019-05-15 DIAGNOSIS — Z7982 Long term (current) use of aspirin: Secondary | ICD-10-CM

## 2019-05-15 DIAGNOSIS — K219 Gastro-esophageal reflux disease without esophagitis: Secondary | ICD-10-CM | POA: Diagnosis present

## 2019-05-15 DIAGNOSIS — Z1159 Encounter for screening for other viral diseases: Secondary | ICD-10-CM

## 2019-05-15 DIAGNOSIS — E669 Obesity, unspecified: Secondary | ICD-10-CM | POA: Diagnosis present

## 2019-05-15 DIAGNOSIS — N189 Chronic kidney disease, unspecified: Secondary | ICD-10-CM

## 2019-05-15 DIAGNOSIS — Z6841 Body Mass Index (BMI) 40.0 and over, adult: Secondary | ICD-10-CM

## 2019-05-15 DIAGNOSIS — E1122 Type 2 diabetes mellitus with diabetic chronic kidney disease: Secondary | ICD-10-CM | POA: Diagnosis present

## 2019-05-15 DIAGNOSIS — S82121A Displaced fracture of lateral condyle of right tibia, initial encounter for closed fracture: Secondary | ICD-10-CM

## 2019-05-15 LAB — COMPREHENSIVE METABOLIC PANEL
ALT: 12 U/L (ref 0–44)
AST: 16 U/L (ref 15–41)
Albumin: 3.7 g/dL (ref 3.5–5.0)
Alkaline Phosphatase: 115 U/L (ref 38–126)
Anion gap: 10 (ref 5–15)
BUN: 45 mg/dL — ABNORMAL HIGH (ref 8–23)
CO2: 23 mmol/L (ref 22–32)
Calcium: 8.5 mg/dL — ABNORMAL LOW (ref 8.9–10.3)
Chloride: 106 mmol/L (ref 98–111)
Creatinine, Ser: 1.72 mg/dL — ABNORMAL HIGH (ref 0.44–1.00)
GFR calc Af Amer: 34 mL/min — ABNORMAL LOW (ref >60)
GFR calc non Af Amer: 29 mL/min — ABNORMAL LOW (ref >60)
Glucose, Bld: 375 mg/dL — ABNORMAL HIGH (ref 70–99)
Potassium: 4.2 mmol/L (ref 3.5–5.1)
Sodium: 139 mmol/L (ref 135–145)
Total Bilirubin: 1.2 mg/dL (ref 0.3–1.2)
Total Protein: 6.6 g/dL (ref 6.5–8.1)

## 2019-05-15 LAB — TYPE AND SCREEN
ABO/RH(D): O POS
Antibody Screen: NEGATIVE

## 2019-05-15 LAB — CBC WITH DIFFERENTIAL/PLATELET
Abs Immature Granulocytes: 0.03 10*3/uL (ref 0.00–0.07)
Basophils Absolute: 0 10*3/uL (ref 0.0–0.1)
Basophils Relative: 0 %
Eosinophils Absolute: 0.2 10*3/uL (ref 0.0–0.5)
Eosinophils Relative: 2 %
HCT: 37.1 % (ref 36.0–46.0)
Hemoglobin: 11.5 g/dL — ABNORMAL LOW (ref 12.0–15.0)
Immature Granulocytes: 0 %
Lymphocytes Relative: 14 %
Lymphs Abs: 1 10*3/uL (ref 0.7–4.0)
MCH: 29.2 pg (ref 26.0–34.0)
MCHC: 31 g/dL (ref 30.0–36.0)
MCV: 94.2 fL (ref 80.0–100.0)
Monocytes Absolute: 0.5 10*3/uL (ref 0.1–1.0)
Monocytes Relative: 7 %
Neutro Abs: 5.4 10*3/uL (ref 1.7–7.7)
Neutrophils Relative %: 77 %
Platelets: 114 10*3/uL — ABNORMAL LOW (ref 150–400)
RBC: 3.94 MIL/uL (ref 3.87–5.11)
RDW: 13.5 % (ref 11.5–15.5)
WBC: 7.2 10*3/uL (ref 4.0–10.5)
nRBC: 0 % (ref 0.0–0.2)

## 2019-05-15 LAB — GLUCOSE, CAPILLARY
Glucose-Capillary: 243 mg/dL — ABNORMAL HIGH (ref 70–99)
Glucose-Capillary: 137 mg/dL — ABNORMAL HIGH (ref 70–99)
Glucose-Capillary: 242 mg/dL — ABNORMAL HIGH (ref 70–99)

## 2019-05-15 LAB — PROTIME-INR
INR: 1 (ref 0.8–1.2)
Prothrombin Time: 13.2 seconds (ref 11.4–15.2)

## 2019-05-15 LAB — HEMOGLOBIN A1C
Hgb A1c MFr Bld: 7.8 % — ABNORMAL HIGH (ref 4.8–5.6)
Mean Plasma Glucose: 177.16 mg/dL

## 2019-05-15 LAB — APTT: aPTT: 28 seconds (ref 24–36)

## 2019-05-15 LAB — SARS CORONAVIRUS 2 BY RT PCR (HOSPITAL ORDER, PERFORMED IN ~~LOC~~ HOSPITAL LAB): SARS Coronavirus 2: NEGATIVE

## 2019-05-15 MED ORDER — ONDANSETRON HCL 4 MG/2ML IJ SOLN
4.0000 mg | INTRAMUSCULAR | Status: AC
  Administered 2019-05-15: 05:00:00 4 mg via INTRAVENOUS
  Filled 2019-05-15: qty 2

## 2019-05-15 MED ORDER — CITALOPRAM HYDROBROMIDE 20 MG PO TABS
40.0000 mg | ORAL_TABLET | Freq: Every day | ORAL | Status: DC
  Administered 2019-05-15 – 2019-05-19 (×4): 40 mg via ORAL
  Filled 2019-05-15 (×5): qty 2

## 2019-05-15 MED ORDER — SODIUM CHLORIDE 0.9 % IV SOLN
INTRAVENOUS | Status: DC
  Administered 2019-05-15 – 2019-05-16 (×3): via INTRAVENOUS

## 2019-05-15 MED ORDER — ONDANSETRON HCL 4 MG PO TABS: 4.0000 mg | ORAL_TABLET | Freq: Four times a day (QID) | ORAL | Status: DC | PRN

## 2019-05-15 MED ORDER — BISACODYL 5 MG PO TBEC: 5.0000 mg | DELAYED_RELEASE_TABLET | Freq: Every day | ORAL | Status: DC | PRN

## 2019-05-15 MED ORDER — DOCUSATE SODIUM 100 MG PO CAPS
100.0000 mg | ORAL_CAPSULE | Freq: Two times a day (BID) | ORAL | Status: DC
  Administered 2019-05-15 (×2): 100 mg via ORAL
  Filled 2019-05-15 (×3): qty 1

## 2019-05-15 MED ORDER — ATORVASTATIN CALCIUM 20 MG PO TABS
40.0000 mg | ORAL_TABLET | Freq: Every day | ORAL | Status: DC
  Administered 2019-05-15 – 2019-05-18 (×3): 40 mg via ORAL
  Filled 2019-05-15 (×3): qty 2

## 2019-05-15 MED ORDER — TRAZODONE HCL 50 MG PO TABS: 25.0000 mg | ORAL_TABLET | Freq: Every evening | ORAL | Status: DC | PRN

## 2019-05-15 MED ORDER — INSULIN ASPART 100 UNIT/ML ~~LOC~~ SOLN
0.0000 [IU] | Freq: Three times a day (TID) | SUBCUTANEOUS | Status: DC
  Administered 2019-05-15: 12:00:00 7 [IU] via SUBCUTANEOUS
  Administered 2019-05-15: 18:00:00 1 [IU] via SUBCUTANEOUS
  Administered 2019-05-17: 18:00:00 2 [IU] via SUBCUTANEOUS
  Administered 2019-05-17 (×2): 3 [IU] via SUBCUTANEOUS
  Administered 2019-05-18 (×2): 2 [IU] via SUBCUTANEOUS
  Administered 2019-05-18: 3 [IU] via SUBCUTANEOUS
  Administered 2019-05-19: 08:00:00 2 [IU] via SUBCUTANEOUS
  Administered 2019-05-19: 12:00:00 3 [IU] via SUBCUTANEOUS
  Filled 2019-05-15 (×10): qty 1

## 2019-05-15 MED ORDER — POLYETHYLENE GLYCOL 3350 17 G PO PACK: 17.0000 g | PACK | Freq: Every day | ORAL | Status: DC | PRN

## 2019-05-15 MED ORDER — INSULIN GLARGINE 100 UNIT/ML ~~LOC~~ SOLN
10.0000 [IU] | Freq: Every day | SUBCUTANEOUS | Status: DC
  Administered 2019-05-15 – 2019-05-16 (×2): 10 [IU] via SUBCUTANEOUS
  Filled 2019-05-15 (×2): qty 0.1

## 2019-05-15 MED ORDER — INSULIN ASPART 100 UNIT/ML ~~LOC~~ SOLN
0.0000 [IU] | Freq: Every day | SUBCUTANEOUS | Status: DC
  Administered 2019-05-15: 22:00:00 2 [IU] via SUBCUTANEOUS
  Filled 2019-05-15: qty 1

## 2019-05-15 MED ORDER — HYDROCODONE-ACETAMINOPHEN 5-325 MG PO TABS
1.0000 | ORAL_TABLET | ORAL | Status: DC | PRN
  Administered 2019-05-15: 22:00:00 2 via ORAL
  Administered 2019-05-15: 08:00:00 1 via ORAL
  Administered 2019-05-16: 02:00:00 2 via ORAL
  Filled 2019-05-15: qty 2
  Filled 2019-05-15: qty 1
  Filled 2019-05-15: qty 2

## 2019-05-15 MED ORDER — PIOGLITAZONE HCL 15 MG PO TABS
15.0000 mg | ORAL_TABLET | Freq: Every day | ORAL | Status: DC
  Administered 2019-05-15: 12:00:00 15 mg via ORAL
  Filled 2019-05-15 (×2): qty 1

## 2019-05-15 MED ORDER — CEFAZOLIN SODIUM-DEXTROSE 2-4 GM/100ML-% IV SOLN
2.0000 g | INTRAVENOUS | Status: DC
  Filled 2019-05-15: qty 100

## 2019-05-15 MED ORDER — ONDANSETRON HCL 4 MG/2ML IJ SOLN: 4.0000 mg | Freq: Four times a day (QID) | INTRAMUSCULAR | Status: DC | PRN

## 2019-05-15 MED ORDER — INSULIN GLARGINE 100 UNIT/ML ~~LOC~~ SOLN
10.0000 [IU] | Freq: Every day | SUBCUTANEOUS | Status: DC
  Filled 2019-05-15: qty 0.1

## 2019-05-15 MED ORDER — ASPIRIN EC 81 MG PO TBEC
81.0000 mg | DELAYED_RELEASE_TABLET | Freq: Every day | ORAL | Status: DC
  Administered 2019-05-15 – 2019-05-19 (×4): 81 mg via ORAL
  Filled 2019-05-15 (×4): qty 1

## 2019-05-15 MED ORDER — PREGABALIN 75 MG PO CAPS
75.0000 mg | ORAL_CAPSULE | Freq: Three times a day (TID) | ORAL | Status: DC
  Administered 2019-05-15 (×3): 75 mg via ORAL
  Filled 2019-05-15 (×4): qty 1

## 2019-05-15 MED ORDER — ACETAMINOPHEN 650 MG RE SUPP: 650.0000 mg | Freq: Four times a day (QID) | RECTAL | Status: DC | PRN

## 2019-05-15 MED ORDER — BUPROPION HCL ER (XL) 150 MG PO TB24
150.0000 mg | ORAL_TABLET | Freq: Every day | ORAL | Status: DC
  Administered 2019-05-15 – 2019-05-19 (×4): 150 mg via ORAL
  Filled 2019-05-15 (×5): qty 1

## 2019-05-15 MED ORDER — MORPHINE SULFATE (PF) 4 MG/ML IV SOLN
4.0000 mg | Freq: Once | INTRAVENOUS | Status: AC
  Administered 2019-05-15: 4 mg via INTRAVENOUS
  Filled 2019-05-15: qty 1

## 2019-05-15 MED ORDER — ACETAMINOPHEN 325 MG PO TABS
650.0000 mg | ORAL_TABLET | Freq: Four times a day (QID) | ORAL | Status: DC | PRN
  Administered 2019-05-15: 15:00:00 650 mg via ORAL
  Filled 2019-05-15 (×2): qty 2

## 2019-05-15 MED ORDER — SODIUM CHLORIDE 0.9 % IV SOLN
Freq: Once | INTRAVENOUS | Status: AC
  Administered 2019-05-15: 08:00:00 via INTRAVENOUS

## 2019-05-15 NOTE — ED Triage Notes (Signed)
Pt arrived via EMS from home where pt was mopping and fell on wet floor, landing onto the right knee. Pt c/o right knee and right ankle pain. Swelling noted. No obvious deformity.

## 2019-05-15 NOTE — ED Notes (Signed)
ED TO INPATIENT HANDOFF REPORT  ED Nurse Name and Phone #: Jasmeet Manton [phone]  S Name/Age/Gender [name] NeatSusan Munoz 73 y.o. female Room/Bed: ED26A/ED26A  Code Status   Code Status: Prior  Home/SNF/Other Home Patient oriented x4 Is this baseline? yes  Triage Complete: Triage complete  Chief Complaint Fall  Triage Note Pt arrived via EMS from home where pt was mopping and fell on wet floor, landing onto the right knee. Pt c/o right knee and right ankle pain. Swelling noted. No obvious deformity.    Allergies No Known Allergies  Level of Care/Admitting Diagnosis ED Disposition    ED Disposition Condition Comment   Admit  Hospital Area: [facility] [[id number]]  Level of Care: Med-Surg [16]  Covid Evaluation: Confirmed COVID Negative  Diagnosis: Tibial fracture [[id number]][[id number]]  Admitting Physician: Delfino LovettSHAH, VIPUL [[id number]]  Attending Physician: Delfino LovettSHAH, VIPUL [[id number]]  Estimated length of stay: past midnight tomorrow  Certification:: I certify this patient will need inpatient services for at least 2 midnights  PT Class (Do Not Modify): Inpatient [101]  PT Acc Code (Do Not Modify): Private [1]       B Medical/Surgery History Past Medical History:  Diagnosis Date  . Coronary artery disease   . Depression   . Diabetes mellitus without complication (HCC)   . GERD (gastroesophageal reflux disease)   . Hypertension    Past Surgical History:  Procedure Laterality Date  . ESOPHAGOGASTRODUODENOSCOPY N/A 09/20/2015   Procedure: ESOPHAGOGASTRODUODENOSCOPY (EGD);  Surgeon: Scot Junobert T Elliott, MD;  [facility];  Service: Endoscopy;  Laterality: N/A;  . PERCUTANEOUS CORONARY ROTOBLATOR INTERVENTION (PCI-R)       A IV Location/Drains/Wounds Patient Lines/Drains/Airways Status   Active Line/Drains/Airways    Name:   Placement date:   Placement time:   Site:   Days:   Peripheral IV 05/15/19 Left Hand   05/15/19    0740    Hand   less than 1          Intake/Output  Last 24 hours No intake or output data in the 24 hours ending 05/15/19 0753  Labs/Imaging Results for orders placed or performed during the hospital encounter of 05/15/19 (from the past 48 hour(s))  SARS Coronavirus 2 (CEPHEID - Performed in [facility] hospital lab), Hosp Order     Status: None   Collection Time: 05/15/19  4:39 AM   Specimen: Nasopharyngeal Swab  Result Value Ref Range   SARS Coronavirus 2 NEGATIVE NEGATIVE    Comment: (NOTE) If result is NEGATIVE SARS-CoV-2 target nucleic acids are NOT DETECTED. The SARS-CoV-2 RNA is generally detectable in upper and lower  respiratory specimens during the acute phase of infection. The lowest  concentration of SARS-CoV-2 viral copies this assay can detect is 250  copies / mL. A negative result does not preclude SARS-CoV-2 infection  and should not be used as the sole basis for treatment or other  patient management decisions.  A negative result may occur with  improper specimen collection / handling, submission of specimen other  than nasopharyngeal swab, presence of viral mutation(s) within the  areas targeted by this assay, and inadequate number of viral copies  (<250 copies / mL). A negative result must be combined with clinical  observations, patient history, and epidemiological information. If result is POSITIVE SARS-CoV-2 target nucleic acids are DETECTED. The SARS-CoV-2 RNA is generally detectable in upper and lower  respiratory specimens dur ing the acute phase of infection.  Positive  results are indicative of active infection with SARS-CoV-2.  Clinical  correlation with patient history and other diagnostic information is  necessary to determine patient infection status.  Positive results do  not rule out bacterial infection or co-infection with other viruses. If result is PRESUMPTIVE POSTIVE SARS-CoV-2 nucleic acids MAY BE PRESENT.   A presumptive positive result was obtained on the submitted specimen  and confirmed on  repeat testing.  While 2019 novel coronavirus  (SARS-CoV-2) nucleic acids may be present in the submitted sample  additional confirmatory testing may be necessary for epidemiological  and / or clinical management purposes  to differentiate between  SARS-CoV-2 and other Sarbecovirus currently known to infect humans.  If clinically indicated additional testing with an alternate test  methodology [phone]) is advised. The SARS-CoV-2 RNA is generally  detectable in upper and lower respiratory sp ecimens during the acute  phase of infection. The expected result is Negative. Fact Sheet for Patients:  StrictlyIdeas.no Fact Sheet for Healthcare Providers: BankingDealers.co.za This test is not yet approved or cleared by the [place] FDA and has been authorized for detection and/or diagnosis of SARS-CoV-2 by FDA under an Emergency Use Authorization (EUA).  This EUA will remain in effect (meaning this test can be used) for the duration of the COVID-19 declaration under Section 564(b)(1) of the Act, 21 U.S.C. section 360bbb-3(b)(1), unless the authorization is terminated or revoked sooner. Performed at [facility], [address] [place], [place] [address]   Comprehensive metabolic panel     Status: Abnormal   Collection Time: 05/15/19  4:39 AM  Result Value Ref Range   Sodium 139 135 - 145 mmol/L   Potassium 4.2 3.5 - 5.1 mmol/L   Chloride 106 98 - 111 mmol/L   CO2 23 22 - 32 mmol/L   Glucose, Bld 375 (H) 70 - 99 mg/dL   BUN 45 (H) 8 - 23 mg/dL   Creatinine, Ser 1.72 (H) 0.44 - 1.00 mg/dL   Calcium 8.5 (L) 8.9 - 10.3 mg/dL   Total Protein 6.6 6.5 - 8.1 g/dL   Albumin 3.7 3.5 - 5.0 g/dL   AST 16 15 - 41 U/L   ALT 12 0 - 44 U/L   Alkaline Phosphatase 115 38 - 126 U/L   Total Bilirubin 1.2 0.3 - 1.2 mg/dL   GFR calc non Af Amer 29 (L) >60 mL/min   GFR calc Af Amer 34 (L) >60 mL/min   Anion gap 10 5 - 15    Comment: Performed  at [facility], [address] [place], [place] [address]  CBC WITH DIFFERENTIAL     Status: Abnormal   Collection Time: 05/15/19  4:39 AM  Result Value Ref Range   WBC 7.2 4.0 - 10.5 K/uL   RBC 3.94 3.87 - 5.11 MIL/uL   Hemoglobin 11.5 (L) 12.0 - 15.0 g/dL   HCT 37.1 36.0 - 46.0 %   MCV 94.2 80.0 - 100.0 fL   MCH 29.2 26.0 - 34.0 pg   MCHC 31.0 30.0 - 36.0 g/dL   RDW 13.5 11.5 - 15.5 %   Platelets 114 (L) 150 - 400 K/uL    Comment: Immature Platelet Fraction may be clinically indicated, consider ordering this additional test [id number]    nRBC 0.0 0.0 - 0.2 %   Neutrophils Relative % 77 %   Neutro Abs 5.4 1.7 - 7.7 K/uL   Lymphocytes Relative 14 %   Lymphs Abs 1.0 0.7 - 4.0 K/uL   Monocytes Relative 7 %   Monocytes Absolute 0.5 0.1 -  1.0 K/uL   Eosinophils Relative 2 %   Eosinophils Absolute 0.2 0.0 - 0.5 K/uL   Basophils Relative 0 %   Basophils Absolute 0.0 0.0 - 0.1 K/uL   Immature Granulocytes 0 %   Abs Immature Granulocytes 0.03 0.00 - 0.07 K/uL    Comment: Performed at [facility], [address] [place], [place] [address]  APTT     Status: None   Collection Time: 05/15/19  4:39 AM  Result Value Ref Range   aPTT 28 24 - 36 seconds    Comment: Performed at [facility], [address] [place], [place] [address]  Protime-INR     Status: None   Collection Time: 05/15/19  4:39 AM  Result Value Ref Range   Prothrombin Time 13.2 11.4 - 15.2 seconds   INR 1.0 0.8 - 1.2    Comment: (NOTE) INR goal varies based on device and disease states. Performed at [facility], [address] [place], [place] [address]   Type and screen     Status: None (Preliminary result)   Collection Time: 05/15/19  4:39 AM  Result Value Ref Range   ABO/RH(D) PENDING    Antibody Screen PENDING    Sample Expiration      05/18/2019,2359 Performed at [facility], [address] [place], [place] [address]    Dg Ankle Complete  Right  Result Date: 05/15/2019 CLINICAL DATA:  73 year old female status post fall landing on right knee and ankle with pain. EXAM: RIGHT ANKLE - COMPLETE 3+ VIEW COMPARISON:  None. FINDINGS: The calcaneus appears intact with degenerative spurring. There is widespread degenerative spurring of the dorsal tarsal bones. Talar dome appears intact. Distal tibia appears intact. There is a nondisplaced transverse fracture of the lateral malleolus visible on image 2. Possible small joint effusion. IMPRESSION: 1. Nondisplaced transverse fracture of the lateral malleolus. 2. Possible small joint effusion. Electronically Signed   By: Odessa FlemingH  Hall M.D.   On: 05/15/2019 03:42   Ct Knee Right Wo Contrast  Result Date: 05/15/2019 CLINICAL DATA:  Fall landing on right knee. Right knee pain and swelling. Lateral tibial plateau fracture. EXAM: CT OF THE right KNEE WITHOUT CONTRAST TECHNIQUE: Multidetector CT imaging of the right knee was performed according to the standard protocol. Multiplanar CT image reconstructions were also generated. COMPARISON:  Radiographs from May 15, 2019 FINDINGS: Bones/Joint/Cartilage Lateral tibial plateau fracture with primarily Schatzker II lateral split depressed morphology and moderate combination. The fracture extends over into the posteromedial portion of the tibial spine posteriorly, nearly to the medial tibial plateau rim. This up to 7 mm of depression of fracture fragments posteriorly. Small lipohemarthrosis. Linear calcifications tracking along the expected location of the proximal MCL and medial patellofemoral ligament attachment to the femur anteriorly for avulsion. Ligaments Suboptimally assessed by CT. Muscles and Tendons Unremarkable Soft tissues Atherosclerosis.  Subcutaneous venous varices medial to the knee. IMPRESSION: 1. Comminuted Schatzker II lateral split depressed fracture of the lateral tibial plateau, extending into the posteromedial portion of the tibial spine, with up to 7  mm of depressed fragments. 2. Linear calcifications favoring avulsion along the proximal MCL and adjacent portion of the distal attachment of the medial patellofemoral ligament. 3. Atherosclerosis. 4. Subcutaneous venous varices. 5. Small lipohemarthrosis. Electronically Signed   By: Gaylyn RongWalter  Liebkemann M.D.   On: 05/15/2019 07:45   Ct 3d Recon At Scanner  Result Date: 05/15/2019 CLINICAL DATA:  Fall landing on right knee. Right knee pain and swelling. Lateral tibial  plateau fracture. EXAM: CT OF THE right KNEE WITHOUT CONTRAST TECHNIQUE: Multidetector CT imaging of the right knee was performed according to the standard protocol. Multiplanar CT image reconstructions were also generated. COMPARISON:  Radiographs from May 15, 2019 FINDINGS: Bones/Joint/Cartilage Lateral tibial plateau fracture with primarily Schatzker II lateral split depressed morphology and moderate combination. The fracture extends over into the posteromedial portion of the tibial spine posteriorly, nearly to the medial tibial plateau rim. This up to 7 mm of depression of fracture fragments posteriorly. Small lipohemarthrosis. Linear calcifications tracking along the expected location of the proximal MCL and medial patellofemoral ligament attachment to the femur anteriorly for avulsion. Ligaments Suboptimally assessed by CT. Muscles and Tendons Unremarkable Soft tissues Atherosclerosis.  Subcutaneous venous varices medial to the knee. IMPRESSION: 1. Comminuted Schatzker II lateral split depressed fracture of the lateral tibial plateau, extending into the posteromedial portion of the tibial spine, with up to 7 mm of depressed fragments. 2. Linear calcifications favoring avulsion along the proximal MCL and adjacent portion of the distal attachment of the medial patellofemoral ligament. 3. Atherosclerosis. 4. Subcutaneous venous varices. 5. Small lipohemarthrosis. Electronically Signed   By: Gaylyn RongWalter  Liebkemann M.D.   On: 05/15/2019 07:45   Dg  Chest Port 1 View  Result Date: 05/15/2019 CLINICAL DATA:  Preop EXAM: PORTABLE CHEST 1 VIEW COMPARISON:  05/30/2013 FINDINGS: Mild cardiomegaly. Low volume chest with interstitial crowding. There is no edema, consolidation, effusion, or pneumothorax. IMPRESSION: 1. No evidence of acute disease. 2. Mild cardiomegaly accentuated by low volumes. Electronically Signed   By: Marnee SpringJonathon  Watts M.D.   On: 05/15/2019 04:37   Dg Knee Right Port  Result Date: 05/15/2019 CLINICAL DATA:  73 year old female status post fall landing on right knee and ankle with pain. EXAM: PORTABLE RIGHT KNEE - 1-2 VIEW COMPARISON:  Right knee series 04/24/2018. FINDINGS: Small to moderate size hyperdense joint effusion with comminution and depression of the lateral tibial plateau. The intercondylar eminence and the medial plateau appear to remain intact. Distal femur and patella appear intact. No fracture of the proximal fibula identified. Calcified peripheral vascular disease. IMPRESSION: Comminuted and depressed lateral tibial plateau fracture with small to moderate joint effusion. Electronically Signed   By: Odessa FlemingH  Hall M.D.   On: 05/15/2019 03:41    Pending Labs Unresulted Labs (From admission, onward)    Start     Ordered   05/15/19 0535  Type and screen Ordered by PROVIDER DEFAULT  Once,   STAT     05/15/19 0535   Signed and Held  CBC  (heparin)  Once,   R    Comments: Baseline for heparin therapy IF NOT ALREADY DRAWN.  Notify MD if PLT < 100 K.    Signed and Held   Signed and Held  Creatinine, serum  (heparin)  Once,   R    Comments: Baseline for heparin therapy IF NOT ALREADY DRAWN.    Signed and Held   Signed and Held  Basic metabolic panel  Tomorrow morning,   R     Signed and Held   Signed and Held  CBC  Tomorrow morning,   R     Signed and Held   Signed and Held  Hemoglobin A1c  Once,   R     Signed and Held          Vitals/Pain Today's Vitals   05/15/19 0300 05/15/19 0330 05/15/19 0600 05/15/19 0733   BP: 133/65 (!) 142/89 131/72   Pulse: 77 77 73  Resp:      Temp:      TempSrc:      SpO2: 97% 96% 97%   PainSc:    5     Isolation Precautions No active isolations  Medications Medications  HYDROcodone-acetaminophen (NORCO/VICODIN) 5-325 MG per tablet 1-2 tablet (1 tablet Oral Given 05/15/19 0743)  morphine 4 MG/ML injection 4 mg (4 mg Intravenous Given 05/15/19 0457)  ondansetron (ZOFRAN) injection 4 mg (4 mg Intravenous Given 05/15/19 0456)  0.9 %  sodium chloride infusion ( Intravenous New Bag/Given 05/15/19 0744)    Mobility Ambulatory     Focused Assessments    R Recommendations: See Admitting Provider Note  Report given to:   Additional Notes:

## 2019-05-15 NOTE — TOC Initial Note (Signed)
Transition of Care [facility]) - Initial/Assessment Note    Patient Details  Name: [name] Munoz MRN: [id number] Date of Birth: 1945/11/25  Transition of Care [facility]) CM/SW Contact:    Jamarian Jacinto, Lenice Llamas Phone Number: [phone]  05/15/2019, 12:16 PM  Clinical Narrative: Clinical Social Worker ([place]) reviewed chart and noted that patient is in the [facility] program and will have surgery tomorrow. CSW contacted [facility] and spoke to patient's PCP at [facility] Dr. Rolena Infante. Per PCP patient is independent with her ADLs and lives in [place] in an apartment in [place]. Per Dr. Rolena Infante if patient needs SNF then [facility] prefers [facility] or [facility]. CSW met with patient and made her aware of above. Per patient she is open to going to SNF if needed. Patient is agreeable to SNF search. FL2 complete and faxed out. CSW will continue to follow and assist as needed.               Expected Discharge Plan: Skilled Nursing Facility Barriers to Discharge: Continued Medical Work up   Patient Goals and CMS Choice Patient states their goals for this hospitalization and ongoing recovery are:: Pain control   Choice offered to / list presented to : Patient  Expected Discharge Plan and Services Expected Discharge Plan: [place] In-house Referral: Clinical Social Work Discharge Planning Services: CM Consult Post Acute Care Choice: [place] Living arrangements for the past 2 months: Apartment                                      Prior Living Arrangements/Services Living arrangements for the past 2 months: Apartment Lives with:: Self Patient language and need for interpreter reviewed:: No Do you feel safe going back to the place where you live?: Yes      Need for Family Participation in Patient Care: No (Comment) Care giver support system in place?: Yes (comment)   Criminal Activity/Legal Involvement Pertinent to Current Situation/Hospitalization: No - Comment as  needed  Activities of Daily Living Home Assistive Devices/Equipment: None ADL Screening (condition at time of admission) Patient's cognitive ability adequate to safely complete daily activities?: Yes Is the patient deaf or have difficulty hearing?: No Does the patient have difficulty seeing, even when wearing glasses/contacts?: No Does the patient have difficulty concentrating, remembering, or making decisions?: No Patient able to express need for assistance with ADLs?: Yes Does the patient have difficulty dressing or bathing?: Yes Independently performs ADLs?: Yes (appropriate for developmental age) Does the patient have difficulty walking or climbing stairs?: Yes Weakness of Legs: Right Weakness of Arms/Hands: None  Permission Sought/Granted Permission sought to share information with : Facility Sport and exercise psychologist, PCP([facility]) Permission granted to share information with : Yes, Verbal Permission Granted              Emotional Assessment Appearance:: Appears stated age   Affect (typically observed): Pleasant, Calm Orientation: : Oriented to Self, Oriented to Place, Oriented to  Time, Oriented to Situation Alcohol / Substance Use: Not Applicable Psych Involvement: No (comment)  Admission diagnosis:  Fracture [T14.8XXA] Closed fracture of lateral portion of right tibial plateau, initial encounter [S82.121A] Nondisplaced fracture of lateral malleolus of right fibula, initial encounter for closed fracture [S82.64XA] Chronic kidney disease, unspecified CKD stage [N18.9] Patient Active Problem List   Diagnosis Date Noted  . Tibial fracture 05/15/2019  . Hypotension 10/17/2018  . UTI (urinary tract infection) 10/21/2015  . Acute  renal failure (ARF) ([place]) 10/19/2015  . Intractable vomiting 09/18/2015   PCP:  Talbert Cage, FNP Pharmacy:   [place], [place] [facility] [address] [place] [address] Phone: [phone] Fax:  [phone]     Social Determinants of Health (SDOH) Interventions    Readmission Risk Interventions No flowsheet data found.

## 2019-05-15 NOTE — ED Provider Notes (Signed)
[facility] Emergency Department Provider Note  ____________________________________________   First MD Initiated Contact with Patient 05/15/19 [id number]     (approximate)  I have reviewed the triage vital signs and the nursing notes.   HISTORY  Chief Complaint Fall    HPI [name] Munoz is a 73 y.o. female who is generally well in spite of her age and lives at home.  She presents by EMS for acute onset severe pain in her right knee and her right ankle after mechanical fall.  She says that she was mopping her floor at 2 AM because she had a service person coming in the morning to do some work as she wanted the floor to be nice.  She slipped in the water and landed on her right knee and twisted her right ankle.  The pain is severe when she moves both joints, and is mild at rest.  She did not strike her head, did not lose consciousness, and denies headache and neck pain.  She has had some swelling in the right knee.  No prior orthopedic injuries.  No recent illnesses.  No contact with COVID patients.  She denies fever/chills, sore throat, chest pain, shortness of breath, nausea, vomiting, abdominal pain.   She has a prior history of coronary artery disease status post coronary stent and takes aspirin and Plavix, no other blood thinners.        Past Medical History:  Diagnosis Date   Coronary artery disease    Depression    Diabetes mellitus without complication (HCC)    GERD (gastroesophageal reflux disease)    Hypertension     Patient Active Problem List   Diagnosis Date Noted   Hypotension 10/17/2018   UTI (urinary tract infection) 10/21/2015   Acute renal failure (ARF) (HCC) 10/19/2015   Intractable vomiting 09/18/2015    Past Surgical History:  Procedure Laterality Date   ESOPHAGOGASTRODUODENOSCOPY N/A 09/20/2015   Procedure: ESOPHAGOGASTRODUODENOSCOPY (EGD);  Surgeon: Scot Junobert T Elliott, MD;  [facility];  Service: Endoscopy;   Laterality: N/A;   PERCUTANEOUS CORONARY ROTOBLATOR INTERVENTION (PCI-R)      Prior to Admission medications   Medication Sig Start Date End Date Taking? Authorizing Provider  clopidogrel (PLAVIX) 75 MG tablet Take 75 mg by mouth daily.   Yes [provider]  insulin glargine (LANTUS) 100 UNIT/ML injection Inject 10 Units into the skin daily.    Yes [provider]  aspirin EC 81 MG tablet Take 81 mg by mouth daily.    [provider]  atorvastatin (LIPITOR) 40 MG tablet Take 40 mg by mouth daily.    [provider]  buPROPion (WELLBUTRIN XL) 150 MG 24 hr tablet Take 150 mg by mouth daily.    [provider]  citalopram (CELEXA) 40 MG tablet Take 40 mg by mouth daily.    [provider]  ergocalciferol (VITAMIN D2) 50000 UNITS capsule Take 50,000 Units by mouth every 14 (fourteen) days. On the first and third Monday of each month    [provider]  ondansetron (ZOFRAN) 4 MG tablet Take 1 tablet by mouth every 6 (six) hours as needed. 09/18/15   [provider]  pioglitazone (ACTOS) 15 MG tablet Take 15 mg by mouth daily.    [provider]  polyethylene glycol (MIRALAX / GLYCOLAX) packet Take 17 g by mouth daily as needed for mild constipation.    [provider]  pregabalin (LYRICA) 75 MG capsule Take 75 mg by mouth  3 (three) times daily.    [provider]  ranitidine (ZANTAC) 150 MG tablet Take 150 mg by mouth 2 (two) times daily as needed.     [provider]    Allergies Patient has no known allergies.  Family History  Problem Relation Age of Onset   Diabetes Mellitus II Maternal Aunt    Diabetes Mellitus I Other     Social History Social History   Tobacco Use   Smoking status: Never Smoker   Smokeless tobacco: Never Used  Substance Use Topics   Alcohol use: No    Alcohol/week: 0.0 standard drinks   Drug use: No    Review of Systems Constitutional: No  fever/chills Eyes: No visual changes. ENT: No sore throat. Cardiovascular: Denies chest pain. Respiratory: Denies shortness of breath. Gastrointestinal: No abdominal pain.  No nausea, no vomiting.  No diarrhea.  No constipation. Genitourinary: Negative for dysuria. Musculoskeletal: Acute onset pain in her right knee and right ankle.  Negative for neck pain.  Negative for back pain. Integumentary: Negative for rash. Neurological: Negative for headaches, focal weakness or numbness.   ____________________________________________   PHYSICAL EXAM:  VITAL SIGNS: ED Triage Vitals [05/15/19 0249]  Enc Vitals Group     BP (!) 126/50     Pulse Rate 74     Resp 18     Temp 98.9 F (37.2 C)     Temp Source Oral     SpO2 98 %     Weight      Height      Head Circumference      Peak Flow      Pain Score      Pain Loc      Pain Edu?      Excl. in GC?     Constitutional: Alert and oriented. Well appearing and in no acute distress. Eyes: Conjunctivae are normal.  Head: Atraumatic. Nose: No congestion/rhinnorhea. Mouth/Throat: Mucous membranes are moist. Neck: No stridor.  No meningeal signs.  No cervical spine tenderness to palpation. Cardiovascular: Normal rate, regular rhythm. Good peripheral circulation. Grossly normal heart sounds. Respiratory: Normal respiratory effort.  No retractions. No audible wheezing. Gastrointestinal: Soft and nontender. No distention.  Musculoskeletal: No obvious gross deformities of her extremities but physical inspection is somewhat limited by her habitus.  She has tenderness to palpation of the right knee particularly the lateral proximal tibia.  She also has tenderness palpation of the lateral malleolus.  Pain with attempted range of motion. Neurologic:  Normal speech and language. No gross focal neurologic deficits are appreciated.  Skin:  Skin is warm, dry and intact. No rash noted. Psychiatric: Mood and affect are normal. Speech and behavior are  normal.  ____________________________________________   LABS (all labs ordered are listed, but only abnormal results are displayed)  Labs Reviewed  COMPREHENSIVE METABOLIC PANEL - Abnormal; Notable for the following components:      Result Value   Glucose, Bld 375 (*)    BUN 45 (*)    Creatinine, Ser 1.72 (*)    Calcium 8.5 (*)    GFR calc non Af Amer 29 (*)    GFR calc Af Amer 34 (*)    All other components within normal limits  CBC WITH DIFFERENTIAL/PLATELET - Abnormal; Notable for the following components:   Hemoglobin 11.5 (*)    Platelets 114 (*)    All other components within normal limits  SARS CORONAVIRUS 2 (HOSPITAL ORDER, PERFORMED IN [facility])  APTT  PROTIME-INR  TYPE AND SCREEN  TYPE AND SCREEN   ____________________________________________  EKG  ED ECG REPORT I, Hinda Kehr, the attending physician, personally viewed and interpreted this ECG.  Date: 05/15/2019 EKG Time: 6:35 AM Rate: 69 Rhythm: normal sinus rhythm QRS Axis: normal Intervals: normal ST/T Wave abnormalities: normal Narrative Interpretation: no evidence of acute ischemia  ____________________________________________  RADIOLOGY I, Hinda Kehr, personally viewed and evaluated these images (plain radiographs) as part of my medical decision making, as well as reviewing the written report by the radiologist.  ED MD interpretation: Normal chest x-ray.  Nondisplaced fracture of the right lateral malleolus.  Comminuted fracture of the right tibial plateau.  Official radiology report(s): Dg Ankle Complete Right  Result Date: 05/15/2019 CLINICAL DATA:  73 year old female status post fall landing on right knee and ankle with pain. EXAM: RIGHT ANKLE - COMPLETE 3+ VIEW COMPARISON:  None. FINDINGS: The calcaneus appears intact with degenerative spurring. There is widespread degenerative spurring of the dorsal tarsal bones. Talar dome appears intact. Distal tibia appears intact. There  is a nondisplaced transverse fracture of the lateral malleolus visible on image 2. Possible small joint effusion. IMPRESSION: 1. Nondisplaced transverse fracture of the lateral malleolus. 2. Possible small joint effusion. Electronically Signed   By: Genevie Ann M.D.   On: 05/15/2019 03:42   Dg Chest Port 1 View  Result Date: 05/15/2019 CLINICAL DATA:  Preop EXAM: PORTABLE CHEST 1 VIEW COMPARISON:  05/30/2013 FINDINGS: Mild cardiomegaly. Low volume chest with interstitial crowding. There is no edema, consolidation, effusion, or pneumothorax. IMPRESSION: 1. No evidence of acute disease. 2. Mild cardiomegaly accentuated by low volumes. Electronically Signed   By: Monte Fantasia M.D.   On: 05/15/2019 04:37   Dg Knee Right Port  Result Date: 05/15/2019 CLINICAL DATA:  73 year old female status post fall landing on right knee and ankle with pain. EXAM: PORTABLE RIGHT KNEE - 1-2 VIEW COMPARISON:  Right knee series 04/24/2018. FINDINGS: Small to moderate size hyperdense joint effusion with comminution and depression of the lateral tibial plateau. The intercondylar eminence and the medial plateau appear to remain intact. Distal femur and patella appear intact. No fracture of the proximal fibula identified. Calcified peripheral vascular disease. IMPRESSION: Comminuted and depressed lateral tibial plateau fracture with small to moderate joint effusion. Electronically Signed   By: Genevie Ann M.D.   On: 05/15/2019 03:41    ____________________________________________   PROCEDURES   Procedure(s) performed (including Critical Care):  Procedures   ____________________________________________   INITIAL IMPRESSION / MDM / [place] / ED COURSE  As part of my medical decision making, I reviewed the following data within the [place] notes reviewed and incorporated, Labs reviewed , EKG interpreted , Old chart reviewed, Radiograph reviewed , Discussed with admitting physician  (Dr. Sidney Ace), A consult was requested and obtained from this/these consultant(s) Orthopedics (Dr. Marry Guan) and Notes from prior ED visits   Differential diagnosis includes, but is not limited to, knee fracture or dislocation, ankle fracture or dislocation or sprain, hip fracture, pelvic fracture, head or neck injury.  The patient is very comfortable and in no distress while sitting at rest.  She has had no recent infectious signs or symptoms.  She had x-rays done before I saw her which reveal a tibial plateau fracture and a lateral malleolus fracture in the right leg.  I will discuss the case with orthopedics.  Lab work is notable for essentially normal CBC and a comprehensive metabolic panel notable for creatinine of  1.7 but looking back to the record she has a history of chronic kidney disease.  I sent a rapid coronavirus swab anticipating operative intervention which came back negative later (I am documenting out of chronological order).      Clinical Course as of May 14 699  Thu May 15, 2019  [id number] Paging Dr. Ernest PineHooten   [CF]  [phone] I discussed the case by phone with Dr. Ernest PineHooten.  He reviewed the images and acknowledges that the patient will need surgery.  He requested a CT scan without contrast of the right knee for better characterization of the fractures.  He requested a splint of the right ankle which she believes will be managed nonoperatively.  He also recommended a knee immobilizer for the right leg which can be scanned through.  He requested admission to medicine for medical clearance and stabilization for surgery.  I will contact the hospitalist team after the results of her lab work including a rapid coronavirus swab to prepare for operative care.   [CF]  0607 No acute abnormalities  [facility] [CF]  [phone] [facility] text with Dr. Arville CareMansy with the hospitalist service.  He will admit or pass along the admission to the daytime hospitalists.   [CF]    Clinical  Course User Index [CF] Loleta RoseForbach, Shron Ozer, MD     ____________________________________________  FINAL CLINICAL IMPRESSION(S) / ED DIAGNOSES  Final diagnoses:  Closed fracture of lateral portion of right tibial plateau, initial encounter  Nondisplaced fracture of lateral malleolus of right fibula, initial encounter for closed fracture  Chronic kidney disease, unspecified CKD stage     MEDICATIONS GIVEN DURING THIS VISIT:  Medications  0.9 %  sodium chloride infusion (has no administration in time range)  morphine 4 MG/ML injection 4 mg (4 mg Intravenous Given 05/15/19 0457)  ondansetron (ZOFRAN) injection 4 mg (4 mg Intravenous Given 05/15/19 0456)     ED Discharge Orders    None      *Please note:  [name] NeatSusan Tatum was evaluated in Emergency Department on 05/15/2019 for the symptoms described in the history of present illness. She was evaluated in the context of the global COVID-19 pandemic, which necessitated consideration that the patient might be at risk for infection with the SARS-CoV-2 virus that causes COVID-19. Institutional protocols and algorithms that pertain to the evaluation of patients at risk for COVID-19 are in a state of rapid change based on information released by regulatory bodies including the CDC and federal and state organizations. These policies and algorithms were followed during the patient's care in the ED.  Some ED evaluations and interventions may be delayed as a result of limited staffing during the pandemic.*  Note:  This document was prepared using Dragon voice recognition software and may include unintentional dictation errors.   Loleta RoseForbach, Jacody Beneke, MD 05/15/19 0700

## 2019-05-15 NOTE — Consult Note (Signed)
ORTHOPAEDIC CONSULTATION  PATIENT NAME: [name] Munoz DOB: [birth year]  MRN: [id number]  REQUESTING PHYSICIAN: Delfino LovettShah, Vipul, MD  Chief Complaint: Right knee and ankle pain  HPI: [name] Munoz is a 73 y.o. female who was mopping her floor earlier this morning and slipped on the wet floor.  She fell on her right knee and twisted her right ankle.  She had the immediate onset of severe right knee and ankle pain.  She was unable to stand or bear weight on the right lower extremity due to the right knee and ankle pain.  She denies any other injuries.  She denies any loss of consciousness.    Past Medical History:  Diagnosis Date  . Coronary artery disease   . Depression   . Diabetes mellitus without complication (HCC)   . GERD (gastroesophageal reflux disease)   . Hypertension    Past Surgical History:  Procedure Laterality Date  . ESOPHAGOGASTRODUODENOSCOPY N/A 09/20/2015   Procedure: ESOPHAGOGASTRODUODENOSCOPY (EGD);  Surgeon: Scot Junobert T Elliott, MD;  [facility];  Service: Endoscopy;  Laterality: N/A;  . PERCUTANEOUS CORONARY ROTOBLATOR INTERVENTION (PCI-R)     Social History   Socioeconomic History  . Marital status: Legally Separated    Spouse name: Not on file  . Number of children: Not on file  . Years of education: Not on file  . Highest education level: Not on file  Occupational History  . Not on file  Social Needs  . Financial resource strain: Not on file  . Food insecurity    Worry: Not on file    Inability: Not on file  . Transportation needs    Medical: Not on file    Non-medical: Not on file  Tobacco Use  . Smoking status: Never Smoker  . Smokeless tobacco: Never Used  Substance and Sexual Activity  . Alcohol use: No    Alcohol/week: 0.0 standard drinks  . Drug use: No  . Sexual activity: Not on file  Lifestyle  . Physical activity    Days per week: Not on file    Minutes per session: Not on file  . Stress: Not on file  Relationships  . Social  Musicianconnections    Talks on phone: Not on file    Gets together: Not on file    Attends religious service: Not on file    Active member of club or organization: Not on file    Attends meetings of clubs or organizations: Not on file    Relationship status: Not on file  Other Topics Concern  . Not on file  Social History Narrative  . Not on file   Family History  Problem Relation Age of Onset  . Diabetes Mellitus II Maternal Aunt   . Diabetes Mellitus I Other    No Known Allergies Prior to Admission medications   Medication Sig Start Date End Date Taking? Authorizing Provider  clopidogrel (PLAVIX) 75 MG tablet Take 75 mg by mouth daily.   Yes [provider]  insulin glargine (LANTUS) 100 UNIT/ML injection Inject 10 Units into the skin daily.    Yes [provider]  aspirin EC 81 MG tablet Take 81 mg by mouth daily.    [provider]  atorvastatin (LIPITOR) 40 MG tablet Take 40 mg by mouth daily.    [provider]  buPROPion (WELLBUTRIN XL) 150 MG 24 hr tablet Take 150 mg by mouth daily.    [provider]  citalopram (CELEXA) 40 MG tablet Take 40 mg by  mouth daily.    [provider]  ergocalciferol (VITAMIN D2) 50000 UNITS capsule Take 50,000 Units by mouth every 14 (fourteen) days. On the first and third Monday of each month    [provider]  ondansetron (ZOFRAN) 4 MG tablet Take 1 tablet by mouth every 6 (six) hours as needed. 09/18/15   [provider]  pioglitazone (ACTOS) 15 MG tablet Take 15 mg by mouth daily.    [provider]  polyethylene glycol (MIRALAX / GLYCOLAX) packet Take 17 g by mouth daily as needed for mild constipation.    [provider]  pregabalin (LYRICA) 75 MG capsule Take 75 mg by mouth 3 (three) times daily.    [provider]  ranitidine (ZANTAC) 150 MG tablet Take 150 mg by mouth 2 (two) times daily as needed.     [provider]   Dg Ankle Complete  Right  Result Date: 05/15/2019 CLINICAL DATA:  73 year old female status post fall landing on right knee and ankle with pain. EXAM: RIGHT ANKLE - COMPLETE 3+ VIEW COMPARISON:  None. FINDINGS: The calcaneus appears intact with degenerative spurring. There is widespread degenerative spurring of the dorsal tarsal bones. Talar dome appears intact. Distal tibia appears intact. There is a nondisplaced transverse fracture of the lateral malleolus visible on image 2. Possible small joint effusion. IMPRESSION: 1. Nondisplaced transverse fracture of the lateral malleolus. 2. Possible small joint effusion. Electronically Signed   By: Odessa FlemingH  Hall M.D.   On: 05/15/2019 03:42   Dg Chest Port 1 View  Result Date: 05/15/2019 CLINICAL DATA:  Preop EXAM: PORTABLE CHEST 1 VIEW COMPARISON:  05/30/2013 FINDINGS: Mild cardiomegaly. Low volume chest with interstitial crowding. There is no edema, consolidation, effusion, or pneumothorax. IMPRESSION: 1. No evidence of acute disease. 2. Mild cardiomegaly accentuated by low volumes. Electronically Signed   By: Marnee SpringJonathon  Watts M.D.   On: 05/15/2019 04:37   Dg Knee Right Port  Result Date: 05/15/2019 CLINICAL DATA:  73 year old female status post fall landing on right knee and ankle with pain. EXAM: PORTABLE RIGHT KNEE - 1-2 VIEW COMPARISON:  Right knee series 04/24/2018. FINDINGS: Small to moderate size hyperdense joint effusion with comminution and depression of the lateral tibial plateau. The intercondylar eminence and the medial plateau appear to remain intact. Distal femur and patella appear intact. No fracture of the proximal fibula identified. Calcified peripheral vascular disease. IMPRESSION: Comminuted and depressed lateral tibial plateau fracture with small to moderate joint effusion. Electronically Signed   By: Odessa FlemingH  Hall M.D.   On: 05/15/2019 03:41    Positive ROS: All other systems have been reviewed and were otherwise negative with the exception of those mentioned in the HPI  and as above.  Physical Exam: General: Well developed, well nourished female seen in no acute distress. HEENT: Atraumatic and normocephalic. Sclera are clear. Extraocular motion is intact. Oropharynx is clear with moist mucosa. Neck: Supple, nontender, good range of motion. Lungs: Clear to auscultation bilaterally. Cardiovascular: Regular rate and rhythm with normal S1 and S2. No murmurs. No gallops or rubs. Pedal pulses are palpable bilaterally. Homans test is negative bilaterally. No significant pretibial or ankle edema. Abdomen: Soft, nontender, and nondistended. Bowel sounds are present. Skin: No lesions in the area of chief complaint Neurologic: Awake, alert, and oriented. Sensory function is grossly intact. Motor strength is felt to be 5 over 5 bilaterally. No clonus or tremor. Good motor coordination. Lymphatic: No axillary or cervical lymphadenopathy  MUSCULOSKELETAL: Examination of the right lower extremity  demonstrates no gross tenderness to palpation about the right hip.  Examination the right knee demonstrates moderate swelling.  A knee effusion is present.  There is marked tenderness to palpation on the lateral aspect of the knee.  The patient demonstrates guarding with any attempted range of motion of the knee.  Examination of the right ankle demonstrates tenderness to palpation along the lateral malleolus.  No gross tenderness to palpation about the medial malleolus.  Assessment: Right lateral tibial plateau fracture Nondisplaced right lateral malleolar fracture  Plan: The findings were discussed in detail with the patient.  Recommendation was made for open reduction and internal fixation of the right lateral tibial plateau fracture.  The right lateral malleolus fracture is nondisplaced and should be amenable to nonsurgical treatment.  The usual perioperative course was discussed. The risks and benefits of surgical intervention were reviewed. The patient expressed understanding of  the risks and benefits and agreed with plans for surgical intervention.   The surgical site was signed as per the "right site surgery" protocol.   Orders were placed for application of a Polar Care to the right knee and elevation of the right lower extremity so as to decrease the swelling.  Oona Trammel P. Holley Bouche M.D.

## 2019-05-15 NOTE — Plan of Care (Signed)

## 2019-05-15 NOTE — Progress Notes (Signed)
Inpatient Diabetes Program Recommendations  AACE/ADA: New Consensus Statement on Inpatient Glycemic Control   Target Ranges:  Prepandial:   less than 140 mg/dL      Peak postprandial:   less than 180 mg/dL (1-2 hours)      Critically ill patients:  140 - 180 mg/dL   Results for [name] Munoz, [name] Munoz (MRN [id number]) as of 05/15/2019 08:23  Ref. Range 05/15/2019 04:39  Glucose Latest Ref Range: 70 - 99 mg/dL 375 (H)   Results for [name] Munoz, [name] Munoz (MRN [id number]) as of 05/15/2019 08:23  Ref. Range 10/17/2018 13:11  Hemoglobin A1C Latest Ref Range: 4.8 - 5.6 % 12.1 (H)   Review of Glycemic Control  Diabetes history: DM2 Outpatient Diabetes medications: Lantus 10 units daily, Actos 15 mg daily Current orders for Inpatient glycemic control: Novolog 0-9 units TID with meals, Novolog 0-5 units QHS  Inpatient Diabetes Program Recommendations:   Insulin - Basal: Please consider ordering Lantus 10 units Q24H starting now (based on 99.9 kg x 0.1 units).  HgbA1C: A1C in process.  Thanks, Barnie Alderman, RN, MSN, CDE Diabetes Coordinator Inpatient Diabetes Program [phone] (Team Pager from 8am to 5pm)

## 2019-05-15 NOTE — H&P (Signed)
[facility] [place] [facility]   PATIENT NAME: [name] NeatSusan Hermann    MR#:  [id number]  DATE OF BIRTH:  1946-01-24  DATE OF ADMISSION:  05/15/2019  PRIMARY CARE PHYSICIAN: Delton PrairieBrooks, Keatah B, FNP   REQUESTING/REFERRING PHYSICIAN: Loleta RoseForbach, Cory, MD  CHIEF COMPLAINT:   Chief Complaint  Patient presents with   Fall   HISTORY OF PRESENT ILLNESS:  [name] Munoz  is a 73 y.o. female with a known history of CAD, Depression admitted for acute onset severe pain in her right knee and her right ankle after mechanical fall.  She says that she was mopping her floor at 2 AM because she had a service person coming in the morning to do some work as she wanted the floor to be nice.  She slipped in the water and landed on her right knee and twisted her right ankle.  The pain is severe when she moves both joints, and is mild at rest.  She did not strike her head, did not lose consciousness, and denies headache and neck pain.  She has had some swelling in the right knee. She has a prior history of coronary artery disease status post coronary stent and takes aspirin and Plavix, no other blood thinners. PAST MEDICAL HISTORY:   Past Medical History:  Diagnosis Date   Coronary artery disease    Depression    Diabetes mellitus without complication (HCC)    GERD (gastroesophageal reflux disease)    Hypertension    PAST SURGICAL HISTORY:   Past Surgical History:  Procedure Laterality Date   ESOPHAGOGASTRODUODENOSCOPY N/A 09/20/2015   Procedure: ESOPHAGOGASTRODUODENOSCOPY (EGD);  Surgeon: Scot Junobert T Elliott, MD;  [facility] END[facility]COPY;  Service: Endoscopy;  Laterality: N/A;   PERCUTANEOUS CORONARY ROTOBLATOR INTERVENTION (PCI-R)      SOCIAL HISTORY:   Social History   Tobacco Use   Smoking status: Never Smoker   Smokeless tobacco: Never Used  Substance Use Topics   Alcohol use: No    Alcohol/week: 0.0 standard drinks    FAMILY HISTORY:   Family History  Problem  Relation Age of Onset   Diabetes Mellitus II Maternal Aunt    Diabetes Mellitus I Other    DRUG ALLERGIES:  No Known Allergies  REVIEW OF SYSTEMS:   Review of Systems  Constitutional: Negative for diaphoresis, fever, malaise/fatigue and weight loss.  HENT: Negative for ear discharge, ear pain, hearing loss, nosebleeds, sore throat and tinnitus.   Eyes: Negative for blurred vision and pain.  Respiratory: Negative.  Negative for cough, hemoptysis, shortness of breath and wheezing.   Cardiovascular: Negative for chest pain, palpitations, orthopnea and leg swelling.  Gastrointestinal: Negative for abdominal pain, blood in stool, constipation, diarrhea, heartburn, nausea and vomiting.  Genitourinary: Negative for dysuria, frequency and urgency.  Musculoskeletal: Positive for falls and joint pain. Negative for back pain and myalgias.  Skin: Negative for itching and rash.  Neurological: Negative for dizziness, tingling, tremors, focal weakness, seizures, weakness and headaches.  Psychiatric/Behavioral: Negative for depression. The patient is not nervous/anxious.    MEDICATIONS AT HOME:   Prior to Admission medications   Medication Sig Start Date End Date Taking? Authorizing Provider  clopidogrel (PLAVIX) 75 MG tablet Take 75 mg by mouth daily.   Yes [provider]  insulin glargine (LANTUS) 100 UNIT/ML injection Inject 10 Units into the skin daily.    Yes [provider]  aspirin EC 81 MG tablet Take 81 mg by mouth daily.    [provider]  atorvastatin (LIPITOR) 40 MG tablet Take 40 mg by mouth daily.    [provider]  buPROPion (WELLBUTRIN XL) 150 MG 24 hr tablet Take 150 mg by mouth daily.    [provider]  citalopram (CELEXA) 40 MG tablet Take 40 mg by mouth daily.    [provider]  ergocalciferol (VITAMIN D2) 50000 UNITS capsule Take 50,000 Units by mouth every 14 (fourteen) days. On the first and third Monday of each month     [provider]  ondansetron (ZOFRAN) 4 MG tablet Take 1 tablet by mouth every 6 (six) hours as needed. 09/18/15   [provider]  pioglitazone (ACTOS) 15 MG tablet Take 15 mg by mouth daily.    [provider]  polyethylene glycol (MIRALAX / GLYCOLAX) packet Take 17 g by mouth daily as needed for mild constipation.    [provider]  pregabalin (LYRICA) 75 MG capsule Take 75 mg by mouth 3 (three) times daily.    [provider]  ranitidine (ZANTAC) 150 MG tablet Take 150 mg by mouth 2 (two) times daily as needed.     [provider]   VITAL SIGNS:  Blood pressure 131/72, pulse 73, temperature 98.9 F (37.2 C), temperature source Oral, resp. rate 18, SpO2 97 %. PHYSICAL EXAMINATION:  Physical Exam  GENERAL:  73 y.o.-year-old patient lying in the bed with no acute distress.  EYES: Pupils equal, round, reactive to light and accommodation. No scleral icterus. Extraocular muscles intact.  HEENT: Head atraumatic, normocephalic. Oropharynx and nasopharynx clear.  NECK:  Supple, no jugular venous distention. No thyroid enlargement, no tenderness.  LUNGS: Normal breath sounds bilaterally, no wheezing, rales,rhonchi or crepitation. No use of accessory muscles of respiration.  CARDIOVASCULAR: S1, S2 normal. No murmurs, rubs, or gallops.  ABDOMEN: Soft, nontender, nondistended. Bowel sounds present. No organomegaly or mass.  EXTREMITIES: No pedal edema, cyanosis, or clubbing. She has tenderness to palpation of the right knee particularly the lateral proximal tibia.  She also has tenderness palpation of the lateral malleolus.   NEUROLOGIC: Cranial nerves II through XII are intact. Muscle strength 5/5 in all extremities. Sensation intact. Gait not checked.  PSYCHIATRIC: The patient is alert and oriented x 3.  SKIN: No obvious rash, lesion, or ulcer.  LABORATORY PANEL:   CBC Recent Labs  Lab 05/15/19 0439  WBC 7.2  HGB 11.5*  HCT 37.1  PLT  114*   ------------------------------------------------------------------------------------------------------------------  Chemistries  Recent Labs  Lab 05/15/19 0439  NA 139  K 4.2  CL 106  CO2 23  GLUCOSE 375*  BUN 45*  CREATININE 1.72*  CALCIUM 8.5*  AST 16  ALT 12  ALKPHOS 115  BILITOT 1.2   ------------------------------------------------------------------------------------------------------------------  Cardiac Enzymes No results for input(s): TROPONINI in the last 168 hours. ------------------------------------------------------------------------------------------------------------------  RADIOLOGY:  Dg Ankle Complete Right  Result Date: 05/15/2019 CLINICAL DATA:  73 year old female status post fall landing on right knee and ankle with pain. EXAM: RIGHT ANKLE - COMPLETE 3+ VIEW COMPARISON:  None. FINDINGS: The calcaneus appears intact with degenerative spurring. There is widespread degenerative spurring of the dorsal tarsal bones. Talar dome appears intact. Distal tibia appears intact. There is a nondisplaced transverse fracture of the lateral malleolus visible on image 2. Possible small joint effusion. IMPRESSION: 1. Nondisplaced transverse fracture of the lateral malleolus. 2. Possible small joint effusion. Electronically Signed   By: Genevie Ann M.D.   On: 05/15/2019 03:42   Dg Chest Port 1 View  Result Date: 05/15/2019  CLINICAL DATA:  Preop EXAM: PORTABLE CHEST 1 VIEW COMPARISON:  05/30/2013 FINDINGS: Mild cardiomegaly. Low volume chest with interstitial crowding. There is no edema, consolidation, effusion, or pneumothorax. IMPRESSION: 1. No evidence of acute disease. 2. Mild cardiomegaly accentuated by low volumes. Electronically Signed   By: Marnee SpringJonathon  Watts M.D.   On: 05/15/2019 04:37   Dg Knee Right Port  Result Date: 05/15/2019 CLINICAL DATA:  73 year old female status post fall landing on right knee and ankle with pain. EXAM: PORTABLE RIGHT KNEE - 1-2 VIEW  COMPARISON:  Right knee series 04/24/2018. FINDINGS: Small to moderate size hyperdense joint effusion with comminution and depression of the lateral tibial plateau. The intercondylar eminence and the medial plateau appear to remain intact. Distal femur and patella appear intact. No fracture of the proximal fibula identified. Calcified peripheral vascular disease. IMPRESSION: Comminuted and depressed lateral tibial plateau fracture with small to moderate joint effusion. Electronically Signed   By: Odessa FlemingH  Hall M.D.   On: 05/15/2019 03:41   IMPRESSION AND PLAN:  [age] y f with tibial # s/p fall  * Right lateral tibial plateau fracture/Nondisplaced right lateral malleolar fracture - d/w dr Ernest Pinehooten, hold plavix and sq heparin.  Continue baby aspirin -Plan for surgery tomorrow.  medically clear for planned surgery  * DM - ssi, DM nurse c/s  * Depression: continue home meds  * HTN: continue home meds. Adjust meds as need    All the records are reviewed and case discussed with ED provider. Management plans discussed with the patient, Dr Ernest PineHooten and they are in agreement.  CODE STATUS: FULL CODE  TOTAL TIME TAKING CARE OF THIS PATIENT: 45 minutes.    Delfino LovettVipul Sharonna Vinje M.D on 05/15/2019 at 7:04 AM  Between 7am to 6pm - Pager - [phone]  After 6pm go to [email or web address] - Social research officer, governmentpassword EPAS [email or web address]  [facility]  Office  [phone]  CC: Primary care physician; Delton PrairieBrooks, Keatah B, FNP   Note: This dictation was prepared with Dragon dictation along with smaller phrase technology. Any transcriptional errors that result from this process are unintentional.

## 2019-05-15 NOTE — NC FL2 (Signed)
[place] MEDICAID FL2 LEVEL OF CARE SCREENING TOOL     IDENTIFICATION  Patient Name: [name] Munoz Birthdate: [birth year] Sex: female Admission Date (Current Location): 05/15/2019  [place]  [place] [id number] [facility] Facility and Address:  [facility], [address], [place], [place] [address]      Provider Number: [id number]  Attending Physician Name and Address:  Delfino LovettShah, Vipul, MD  Relative Name and Phone Number:       Current Level of Care: Hospital Recommended Level of Care: Skilled Nursing Facility Prior Approval Number:    Date Approved/Denied:   PASRR Number:    Discharge Plan: SNF    Current Diagnoses: Patient Active Problem List   Diagnosis Date Noted  . Tibial fracture 05/15/2019  . Hypotension 10/17/2018  . UTI (urinary tract infection) 10/21/2015  . Acute renal failure (ARF) (HCC) 10/19/2015  . Intractable vomiting 09/18/2015    Orientation RESPIRATION BLADDER Height & Weight     Self, Time, Situation, Place  Normal Incontinent Weight:   Height:     BEHAVIORAL SYMPTOMS/MOOD NEUROLOGICAL BOWEL NUTRITION STATUS      Continent Diet(Diet: Regular)  AMBULATORY STATUS COMMUNICATION OF NEEDS Skin   Extensive Assist Verbally Surgical wounds                       Personal Care Assistance Level of Assistance  Bathing, Feeding, Dressing Bathing Assistance: Limited assistance Feeding assistance: Independent Dressing Assistance: Limited assistance     Functional Limitations Info  Sight, Hearing, Speech Sight Info: Adequate Hearing Info: Adequate Speech Info: Adequate    SPECIAL CARE FACTORS FREQUENCY  PT (By licensed PT), OT (By licensed OT)     PT Frequency: 5 OT Frequency: 5            Contractures      Additional Factors Info  Code Status, Allergies Code Status Info: Full Code. Allergies Info: No Known Allergies.           Current Medications (05/15/2019):  This is the current hospital  active medication list Current Facility-Administered Medications  Medication Dose Route Frequency Provider Last Rate Last Dose  . 0.9 %  sodium chloride infusion   Intravenous Continuous Delfino LovettShah, Vipul, MD 75 mL/hr at 05/15/19 1210    . acetaminophen (TYLENOL) tablet 650 mg  650 mg Oral Q6H PRN Delfino LovettShah, Vipul, MD       Or  . acetaminophen (TYLENOL) suppository 650 mg  650 mg Rectal Q6H PRN Delfino LovettShah, Vipul, MD      . aspirin EC tablet 81 mg  81 mg Oral Daily Sherryll BurgerShah, Vipul, MD   81 mg at 05/15/19 1211  . atorvastatin (LIPITOR) tablet 40 mg  40 mg Oral q1800 Delfino LovettShah, Vipul, MD      . bisacodyl (DULCOLAX) EC tablet 5 mg  5 mg Oral Daily PRN Delfino LovettShah, Vipul, MD      . buPROPion (WELLBUTRIN XL) 24 hr tablet 150 mg  150 mg Oral Daily Delfino LovettShah, Vipul, MD   150 mg at 05/15/19 1211  . ceFAZolin (ANCEF) IVPB 2g/100 mL premix  2 g Intravenous To OR Hooten, Illene LabradorJames P, MD      . citalopram (CELEXA) tablet 40 mg  40 mg Oral Daily Delfino LovettShah, Vipul, MD   40 mg at 05/15/19 1212  . docusate sodium (COLACE) capsule 100 mg  100 mg Oral BID Delfino LovettShah, Vipul, MD   100 mg at 05/15/19 1212  . HYDROcodone-acetaminophen (NORCO/VICODIN) 5-325 MG per tablet 1-2 tablet  1-2  tablet Oral Q4H PRN Max Sane, MD   1 tablet at 05/15/19 0743  . insulin aspart (novoLOG) injection 0-5 Units  0-5 Units Subcutaneous QHS Manuella Ghazi, Vipul, MD      . insulin aspart (novoLOG) injection 0-9 Units  0-9 Units Subcutaneous TID WC Max Sane, MD   7 Units at 05/15/19 1210  . insulin glargine (LANTUS) injection 10 Units  10 Units Subcutaneous Daily Manuella Ghazi, Vipul, MD      . insulin glargine (LANTUS) injection 10 Units  10 Units Subcutaneous Daily Max Sane, MD   10 Units at 05/15/19 1212  . ondansetron (ZOFRAN) tablet 4 mg  4 mg Oral Q6H PRN Max Sane, MD       Or  . ondansetron (ZOFRAN) injection 4 mg  4 mg Intravenous Q6H PRN Max Sane, MD      . pioglitazone (ACTOS) tablet 15 mg  15 mg Oral Daily Max Sane, MD   15 mg at 05/15/19 1211  . polyethylene glycol (MIRALAX /  GLYCOLAX) packet 17 g  17 g Oral Daily PRN Max Sane, MD      . pregabalin (LYRICA) capsule 75 mg  75 mg Oral TID Max Sane, MD   75 mg at 05/15/19 1211  . traZODone (DESYREL) tablet 25 mg  25 mg Oral QHS PRN Max Sane, MD         Discharge Medications: Please see discharge summary for a list of discharge medications.  Relevant Imaging Results:  Relevant Lab Results:   Additional Information SSN: [id number]  Isadora Delorey, Veronia Beets, LCSW

## 2019-05-16 ENCOUNTER — Inpatient Hospital Stay: Payer: Medicare (Managed Care) | Admitting: Anesthesiology

## 2019-05-16 ENCOUNTER — Encounter
Admission: EM | Disposition: A | Discharge status: Skilled Nursing Facility | Payer: Self-pay | Source: Home / Self Care | Attending: Nurse Practitioner

## 2019-05-16 ENCOUNTER — Inpatient Hospital Stay: Payer: Medicare (Managed Care)

## 2019-05-16 HISTORY — PX: ORIF TIBIA PLATEAU: SHX2132

## 2019-05-16 LAB — BASIC METABOLIC PANEL
Anion gap: 6 (ref 5–15)
BUN: 33 mg/dL — ABNORMAL HIGH (ref 8–23)
CO2: 24 mmol/L (ref 22–32)
Calcium: 8.1 mg/dL — ABNORMAL LOW (ref 8.9–10.3)
Chloride: 108 mmol/L (ref 98–111)
Creatinine, Ser: 1.44 mg/dL — ABNORMAL HIGH (ref 0.44–1.00)
GFR calc Af Amer: 42 mL/min — ABNORMAL LOW (ref >60)
GFR calc non Af Amer: 36 mL/min — ABNORMAL LOW (ref >60)
Glucose, Bld: 217 mg/dL — ABNORMAL HIGH (ref 70–99)
Potassium: 4.8 mmol/L (ref 3.5–5.1)
Sodium: 138 mmol/L (ref 135–145)

## 2019-05-16 LAB — CBC
HCT: 33.3 % — ABNORMAL LOW (ref 36.0–46.0)
Hemoglobin: 10.2 g/dL — ABNORMAL LOW (ref 12.0–15.0)
MCH: 28.7 pg (ref 26.0–34.0)
MCHC: 30.6 g/dL (ref 30.0–36.0)
MCV: 93.8 fL (ref 80.0–100.0)
Platelets: 100 10*3/uL — ABNORMAL LOW (ref 150–400)
RBC: 3.55 MIL/uL — ABNORMAL LOW (ref 3.87–5.11)
RDW: 13.5 % (ref 11.5–15.5)
WBC: 6 10*3/uL (ref 4.0–10.5)
nRBC: 0 % (ref 0.0–0.2)

## 2019-05-16 LAB — GLUCOSE, CAPILLARY
Glucose-Capillary: 280 mg/dL — ABNORMAL HIGH (ref 70–99)
Glucose-Capillary: 196 mg/dL — ABNORMAL HIGH (ref 70–99)
Glucose-Capillary: 215 mg/dL — ABNORMAL HIGH (ref 70–99)

## 2019-05-16 LAB — MRSA PCR SCREENING: MRSA by PCR: NEGATIVE

## 2019-05-16 SURGERY — OPEN REDUCTION INTERNAL FIXATION (ORIF) TIBIAL PLATEAU
Anesthesia: General | Site: Leg Lower | Laterality: Right

## 2019-05-16 MED ORDER — MORPHINE SULFATE (PF) 4 MG/ML IV SOLN
INTRAVENOUS | Status: AC
  Administered 2019-05-16: 22:00:00 2 mg via INTRAVENOUS
  Filled 2019-05-16: qty 1

## 2019-05-16 MED ORDER — EPHEDRINE SULFATE 50 MG/ML IJ SOLN
INTRAMUSCULAR | Status: DC | PRN
  Administered 2019-05-16: 5 mg via INTRAVENOUS

## 2019-05-16 MED ORDER — CEFAZOLIN SODIUM-DEXTROSE 2-4 GM/100ML-% IV SOLN
INTRAVENOUS | Status: AC
  Filled 2019-05-16: qty 100

## 2019-05-16 MED ORDER — PROPOFOL 10 MG/ML IV BOLUS
INTRAVENOUS | Status: AC
  Filled 2019-05-16: qty 20

## 2019-05-16 MED ORDER — PIOGLITAZONE HCL 30 MG PO TABS
45.0000 mg | ORAL_TABLET | Freq: Every day | ORAL | Status: DC
  Administered 2019-05-17 – 2019-05-19 (×3): 45 mg via ORAL
  Filled 2019-05-16 (×3): qty 1

## 2019-05-16 MED ORDER — ENOXAPARIN SODIUM 40 MG/0.4ML ~~LOC~~ SOLN
40.0000 mg | SUBCUTANEOUS | Status: DC
  Administered 2019-05-17: 09:00:00 40 mg via SUBCUTANEOUS
  Filled 2019-05-16: qty 0.4

## 2019-05-16 MED ORDER — ACETAMINOPHEN 10 MG/ML IV SOLN
INTRAVENOUS | Status: DC | PRN
  Administered 2019-05-16: 1000 mg via INTRAVENOUS

## 2019-05-16 MED ORDER — ACETAMINOPHEN 10 MG/ML IV SOLN
1000.0000 mg | Freq: Four times a day (QID) | INTRAVENOUS | Status: AC
  Administered 2019-05-16 – 2019-05-17 (×4): 1000 mg via INTRAVENOUS
  Filled 2019-05-16 (×4): qty 100

## 2019-05-16 MED ORDER — ONDANSETRON HCL 4 MG/2ML IJ SOLN: 4.0000 mg | Freq: Four times a day (QID) | INTRAMUSCULAR | Status: DC | PRN

## 2019-05-16 MED ORDER — METOCLOPRAMIDE HCL 10 MG PO TABS
10.0000 mg | ORAL_TABLET | Freq: Three times a day (TID) | ORAL | Status: AC
  Administered 2019-05-17 – 2019-05-18 (×8): 10 mg via ORAL
  Filled 2019-05-16 (×8): qty 1

## 2019-05-16 MED ORDER — CEFAZOLIN SODIUM-DEXTROSE 2-3 GM-%(50ML) IV SOLR
INTRAVENOUS | Status: DC | PRN
  Administered 2019-05-16: 2 g via INTRAVENOUS

## 2019-05-16 MED ORDER — BISACODYL 10 MG RE SUPP
10.0000 mg | Freq: Every day | RECTAL | Status: DC | PRN
  Administered 2019-05-19: 08:00:00 10 mg via RECTAL
  Filled 2019-05-16: qty 1

## 2019-05-16 MED ORDER — ACETAMINOPHEN 10 MG/ML IV SOLN
INTRAVENOUS | Status: AC
  Filled 2019-05-16: qty 100

## 2019-05-16 MED ORDER — SODIUM CHLORIDE 0.9 % IV SOLN
INTRAVENOUS | Status: DC
  Administered 2019-05-16 – 2019-05-17 (×2): via INTRAVENOUS

## 2019-05-16 MED ORDER — HYDROMORPHONE HCL 1 MG/ML IJ SOLN: 0.5000 mg | INTRAMUSCULAR | Status: DC | PRN

## 2019-05-16 MED ORDER — PROPOFOL 10 MG/ML IV BOLUS
INTRAVENOUS | Status: DC | PRN
  Administered 2019-05-16: 150 mg via INTRAVENOUS

## 2019-05-16 MED ORDER — LISINOPRIL 10 MG PO TABS
10.0000 mg | ORAL_TABLET | Freq: Every day | ORAL | Status: DC
  Administered 2019-05-17 – 2019-05-19 (×3): 10 mg via ORAL
  Filled 2019-05-16 (×3): qty 1

## 2019-05-16 MED ORDER — OXYCODONE HCL 5 MG PO TABS
5.0000 mg | ORAL_TABLET | ORAL | Status: DC | PRN
  Administered 2019-05-17 – 2019-05-19 (×6): 5 mg via ORAL
  Filled 2019-05-16 (×6): qty 1

## 2019-05-16 MED ORDER — SUGAMMADEX SODIUM 200 MG/2ML IV SOLN
INTRAVENOUS | Status: DC | PRN
  Administered 2019-05-16: 200 mg via INTRAVENOUS

## 2019-05-16 MED ORDER — VITAMIN B-12 1000 MCG PO TABS: 1000.0000 ug | ORAL_TABLET | ORAL | Status: DC

## 2019-05-16 MED ORDER — OXYCODONE HCL 5 MG/5ML PO SOLN: 5.0000 mg | Freq: Once | ORAL | Status: DC | PRN

## 2019-05-16 MED ORDER — MIDAZOLAM HCL 2 MG/2ML IJ SOLN
INTRAMUSCULAR | Status: AC
  Filled 2019-05-16: qty 2

## 2019-05-16 MED ORDER — SEVOFLURANE IN SOLN
RESPIRATORY_TRACT | Status: AC
  Filled 2019-05-16: qty 250

## 2019-05-16 MED ORDER — PREGABALIN 75 MG PO CAPS
75.0000 mg | ORAL_CAPSULE | Freq: Two times a day (BID) | ORAL | Status: DC
  Administered 2019-05-17 – 2019-05-19 (×5): 75 mg via ORAL
  Filled 2019-05-16 (×5): qty 1

## 2019-05-16 MED ORDER — INSULIN DETEMIR 100 UNIT/ML ~~LOC~~ SOLN
10.0000 [IU] | Freq: Every day | SUBCUTANEOUS | Status: DC
  Administered 2019-05-17 – 2019-05-19 (×3): 10 [IU] via SUBCUTANEOUS
  Filled 2019-05-16 (×4): qty 0.1

## 2019-05-16 MED ORDER — FENTANYL CITRATE (PF) 100 MCG/2ML IJ SOLN
INTRAMUSCULAR | Status: AC
  Filled 2019-05-16: qty 2

## 2019-05-16 MED ORDER — FERROUS SULFATE 325 (65 FE) MG PO TABS
325.0000 mg | ORAL_TABLET | Freq: Two times a day (BID) | ORAL | Status: DC
  Administered 2019-05-17 – 2019-05-19 (×5): 325 mg via ORAL
  Filled 2019-05-16 (×5): qty 1

## 2019-05-16 MED ORDER — METOPROLOL SUCCINATE ER 25 MG PO TB24
25.0000 mg | ORAL_TABLET | Freq: Every day | ORAL | Status: DC
  Administered 2019-05-17 – 2019-05-19 (×3): 25 mg via ORAL
  Filled 2019-05-16 (×3): qty 1

## 2019-05-16 MED ORDER — PIOGLITAZONE HCL 30 MG PO TABS
30.0000 mg | ORAL_TABLET | Freq: Once | ORAL | Status: DC
  Filled 2019-05-16: qty 1

## 2019-05-16 MED ORDER — ONDANSETRON HCL 4 MG/2ML IJ SOLN
INTRAMUSCULAR | Status: DC | PRN
  Administered 2019-05-16: 4 mg via INTRAVENOUS

## 2019-05-16 MED ORDER — CEFAZOLIN SODIUM-DEXTROSE 2-4 GM/100ML-% IV SOLN
2.0000 g | Freq: Four times a day (QID) | INTRAVENOUS | Status: AC
  Administered 2019-05-17 (×4): 2 g via INTRAVENOUS
  Filled 2019-05-16 (×4): qty 100

## 2019-05-16 MED ORDER — FLUTICASONE PROPIONATE 50 MCG/ACT NA SUSP: 2.0000 | Freq: Every day | NASAL | Status: DC

## 2019-05-16 MED ORDER — METOCLOPRAMIDE HCL 10 MG PO TABS: 5.0000 mg | ORAL_TABLET | Freq: Three times a day (TID) | ORAL | Status: DC | PRN

## 2019-05-16 MED ORDER — SUCCINYLCHOLINE CHLORIDE 20 MG/ML IJ SOLN
INTRAMUSCULAR | Status: DC | PRN
  Administered 2019-05-16: 120 mg via INTRAVENOUS

## 2019-05-16 MED ORDER — MAGNESIUM HYDROXIDE 400 MG/5ML PO SUSP
30.0000 mL | Freq: Every day | ORAL | Status: DC | PRN
  Administered 2019-05-18: 22:00:00 30 mL via ORAL
  Filled 2019-05-16: qty 30

## 2019-05-16 MED ORDER — VITAMIN D 25 MCG (1000 UNIT) PO TABS
2000.0000 [IU] | ORAL_TABLET | Freq: Every day | ORAL | Status: DC
  Administered 2019-05-17 – 2019-05-19 (×3): 2000 [IU] via ORAL
  Filled 2019-05-16 (×3): qty 2

## 2019-05-16 MED ORDER — OXYCODONE HCL 5 MG PO TABS: 5.0000 mg | ORAL_TABLET | Freq: Once | ORAL | Status: DC | PRN

## 2019-05-16 MED ORDER — FENTANYL CITRATE (PF) 100 MCG/2ML IJ SOLN
INTRAMUSCULAR | Status: DC | PRN
  Administered 2019-05-16 (×3): 50 ug via INTRAVENOUS
  Administered 2019-05-16 (×2): 25 ug via INTRAVENOUS

## 2019-05-16 MED ORDER — FLEET ENEMA 7-19 GM/118ML RE ENEM
1.0000 | ENEMA | Freq: Once | RECTAL | Status: AC | PRN
  Administered 2019-05-19: 1 via RECTAL

## 2019-05-16 MED ORDER — MENTHOL 3 MG MT LOZG
1.0000 | LOZENGE | OROMUCOSAL | Status: DC | PRN
  Filled 2019-05-16: qty 9

## 2019-05-16 MED ORDER — MORPHINE SULFATE (PF) 4 MG/ML IV SOLN
2.0000 mg | INTRAVENOUS | Status: DC | PRN
  Administered 2019-05-16 (×4): 2 mg via INTRAVENOUS

## 2019-05-16 MED ORDER — PANTOPRAZOLE SODIUM 40 MG PO TBEC
40.0000 mg | DELAYED_RELEASE_TABLET | Freq: Two times a day (BID) | ORAL | Status: DC
  Administered 2019-05-17 – 2019-05-19 (×5): 40 mg via ORAL
  Filled 2019-05-16 (×5): qty 1

## 2019-05-16 MED ORDER — ACETAMINOPHEN 325 MG PO TABS
325.0000 mg | ORAL_TABLET | Freq: Four times a day (QID) | ORAL | Status: DC | PRN
  Administered 2019-05-17 – 2019-05-19 (×3): 650 mg via ORAL
  Filled 2019-05-16 (×3): qty 2

## 2019-05-16 MED ORDER — SODIUM CHLORIDE 0.9 % IV SOLN
INTRAVENOUS | Status: DC
  Administered 2019-05-16 (×2): via INTRAVENOUS

## 2019-05-16 MED ORDER — LINAGLIPTIN 5 MG PO TABS
5.0000 mg | ORAL_TABLET | Freq: Every day | ORAL | Status: DC
  Administered 2019-05-17 – 2019-05-19 (×3): 5 mg via ORAL
  Filled 2019-05-16 (×3): qty 1

## 2019-05-16 MED ORDER — LIDOCAINE HCL (CARDIAC) PF 100 MG/5ML IV SOSY
PREFILLED_SYRINGE | INTRAVENOUS | Status: DC | PRN
  Administered 2019-05-16: 60 mg via INTRAVENOUS

## 2019-05-16 MED ORDER — PHENYLEPHRINE HCL (PRESSORS) 10 MG/ML IV SOLN
INTRAVENOUS | Status: DC | PRN
  Administered 2019-05-16: 100 ug via INTRAVENOUS

## 2019-05-16 MED ORDER — METOCLOPRAMIDE HCL 5 MG/ML IJ SOLN: 5.0000 mg | Freq: Three times a day (TID) | INTRAMUSCULAR | Status: DC | PRN

## 2019-05-16 MED ORDER — DEXAMETHASONE SODIUM PHOSPHATE 10 MG/ML IJ SOLN
INTRAMUSCULAR | Status: DC | PRN
  Administered 2019-05-16: 5 mg via INTRAVENOUS

## 2019-05-16 MED ORDER — ONDANSETRON HCL 4 MG PO TABS: 4.0000 mg | ORAL_TABLET | Freq: Four times a day (QID) | ORAL | Status: DC | PRN

## 2019-05-16 MED ORDER — OXYCODONE HCL 5 MG PO TABS
10.0000 mg | ORAL_TABLET | ORAL | Status: DC | PRN
  Administered 2019-05-18 (×2): 10 mg via ORAL
  Filled 2019-05-16 (×2): qty 2

## 2019-05-16 MED ORDER — SENNOSIDES-DOCUSATE SODIUM 8.6-50 MG PO TABS
1.0000 | ORAL_TABLET | Freq: Two times a day (BID) | ORAL | Status: DC
  Administered 2019-05-16 – 2019-05-19 (×6): 1 via ORAL
  Filled 2019-05-16 (×6): qty 1

## 2019-05-16 MED ORDER — PHENOL 1.4 % MT LIQD
1.0000 | OROMUCOSAL | Status: DC | PRN
  Filled 2019-05-16: qty 177

## 2019-05-16 MED ORDER — TRAMADOL HCL 50 MG PO TABS: 50.0000 mg | ORAL_TABLET | ORAL | Status: DC | PRN

## 2019-05-16 MED ORDER — FLUTICASONE PROPIONATE 50 MCG/ACT NA SUSP
2.0000 | Freq: Every day | NASAL | Status: DC
  Administered 2019-05-16 – 2019-05-18 (×2): 2 via NASAL
  Filled 2019-05-16: qty 16

## 2019-05-16 MED ORDER — ROCURONIUM BROMIDE 100 MG/10ML IV SOLN
INTRAVENOUS | Status: DC | PRN
  Administered 2019-05-16: 25 mg via INTRAVENOUS
  Administered 2019-05-16: 5 mg via INTRAVENOUS

## 2019-05-16 SURGICAL SUPPLY — 57 items
BIT DRILL 2.5X2.75 QC CALB (BIT) ×2 IMPLANT
BIT DRILL CAL (BIT) IMPLANT
BNDG ESMARK 6X12 TAN STRL LF (GAUZE/BANDAGES/DRESSINGS) ×2 IMPLANT
BRACE KNEE POST OP SHORT (BRACE) ×3 IMPLANT
CANISTER SUCT 1200ML W/VALVE (MISCELLANEOUS) ×3 IMPLANT
CATH COUDE FOLEY 5CC 14FR (CATHETERS) ×2 IMPLANT
COOLER POLAR GLACIER W/PUMP (MISCELLANEOUS) ×3 IMPLANT
COVER LIGHT HANDLE STERIS (MISCELLANEOUS) ×2 IMPLANT
COVER WAND RF STERILE (DRAPES) ×3 IMPLANT
CUFF TOURN SGL QUICK 24 (TOURNIQUET CUFF)
CUFF TOURN SGL QUICK 30 (TOURNIQUET CUFF)
CUFF TOURN SGL QUICK 34 (TOURNIQUET CUFF) ×2
CUFF TRNQT CYL 24X4X16.5-23 (TOURNIQUET CUFF) IMPLANT
CUFF TRNQT CYL 30X4X21-28X (TOURNIQUET CUFF) IMPLANT
CUFF TRNQT CYL 34X4.125X (TOURNIQUET CUFF) IMPLANT
DRAPE C-ARM XRAY 36X54 (DRAPES) ×3 IMPLANT
DRAPE C-ARMOR (DRAPES) ×2 IMPLANT
DRILL BIT CAL (BIT) ×3
DRSG OPSITE POSTOP 3X4 (GAUZE/BANDAGES/DRESSINGS) ×2 IMPLANT
DRSG OPSITE POSTOP 4X10 (GAUZE/BANDAGES/DRESSINGS) ×2 IMPLANT
DURAPREP 26ML APPLICATOR (WOUND CARE) ×3 IMPLANT
ELECT REM PT RETURN 9FT ADLT (ELECTROSURGICAL) ×3
ELECTRODE REM PT RTRN 9FT ADLT (ELECTROSURGICAL) ×1 IMPLANT
GAUZE SPONGE 4X4 12PLY STRL (GAUZE/BANDAGES/DRESSINGS) ×5 IMPLANT
GAUZE XEROFORM 1X8 LF (GAUZE/BANDAGES/DRESSINGS) ×2 IMPLANT
GLOVE BIO SURGEON STRL SZ7 (GLOVE) ×4 IMPLANT
GLOVE BIOGEL M STRL SZ7.5 (GLOVE) ×5 IMPLANT
GLOVE BIOGEL PI IND STRL 7.5 (GLOVE) IMPLANT
GLOVE BIOGEL PI INDICATOR 7.5 (GLOVE) ×4
GLOVE INDICATOR 8.0 STRL GRN (GLOVE) ×3 IMPLANT
GOWN STRL REUS W/ TWL LRG LVL3 (GOWN DISPOSABLE) ×2 IMPLANT
GOWN STRL REUS W/TWL LRG LVL3 (GOWN DISPOSABLE) ×6
K-WIRE ACE 1.6X6 (WIRE) ×9
KIT TURNOVER KIT A (KITS) ×3 IMPLANT
KWIRE ACE 1.6X6 (WIRE) IMPLANT
NS IRRIG 1000ML POUR BTL (IV SOLUTION) ×3 IMPLANT
PACK TOTAL KNEE (MISCELLANEOUS) ×3 IMPLANT
PAD ABD DERMACEA PRESS 5X9 (GAUZE/BANDAGES/DRESSINGS) ×4 IMPLANT
PAD PREP 24X41 OB/GYN DISP (PERSONAL CARE ITEMS) ×3 IMPLANT
PAD WRAPON POLAR KNEE (MISCELLANEOUS) ×1 IMPLANT
PENCIL SMOKE ULTRAEVAC 22 CON (MISCELLANEOUS) ×2 IMPLANT
PLATE LOCK 3H STD RT PROX TIB (Plate) ×2 IMPLANT
SCREW CORTICAL 3.5MM 38MM (Screw) ×2 IMPLANT
SCREW LOCK 3.5X65 DIST TIB (Screw) ×2 IMPLANT
SCREW LOCK CORT STAR 3.5X58 (Screw) ×4 IMPLANT
SCREW LOCK CORT STAR 3.5X65 (Screw) ×6 IMPLANT
SCREW LOCK CORT STAR 3.5X70 (Screw) ×6 IMPLANT
STAPLER SKIN PROX 35W (STAPLE) ×3 IMPLANT
STOCKINETTE IMPERV 14X48 (MISCELLANEOUS) ×2 IMPLANT
SUCTION FRAZIER HANDLE 10FR (MISCELLANEOUS) ×2
SUCTION TUBE FRAZIER 10FR DISP (MISCELLANEOUS) IMPLANT
SUT VIC AB 0 CT1 36 (SUTURE) ×3 IMPLANT
SUT VIC AB 1 CT1 36 (SUTURE) ×6 IMPLANT
SUT VIC AB 2-0 CT1 27 (SUTURE) ×2
SUT VIC AB 2-0 CT1 TAPERPNT 27 (SUTURE) ×1 IMPLANT
TRAY FOLEY MTR SLVR 16FR STAT (SET/KITS/TRAYS/PACK) ×3 IMPLANT
WRAPON POLAR PAD KNEE (MISCELLANEOUS) ×3

## 2019-05-16 NOTE — Anesthesia Procedure Notes (Signed)
Procedure Name: Intubation Date/Time: 05/16/2019 5:07 PM Performed by: Dionne Bucy, CRNA Pre-anesthesia Checklist: Patient identified, Patient being monitored, Timeout performed, Emergency Drugs available and Suction available Patient Re-evaluated:Patient Re-evaluated prior to induction Oxygen Delivery Method: Circle system utilized Preoxygenation: Pre-oxygenation with 100% oxygen Induction Type: IV induction Ventilation: Mask ventilation without difficulty Laryngoscope Size: 3 and McGraph Grade View: Grade I Tube type: Oral Tube size: 7.0 mm Number of attempts: 1 Airway Equipment and Method: Stylet and Video-laryngoscopy Placement Confirmation: ETT inserted through vocal cords under direct vision,  positive ETCO2 and breath sounds checked- equal and bilateral Secured at: 21 cm Tube secured with: Tape Dental Injury: Teeth and Oropharynx as per pre-operative assessment

## 2019-05-16 NOTE — TOC Progression Note (Addendum)
Transition of Care [facility]) - Progression Note    Patient Details  Name: [name] Munoz MRN: [id number] Date of Birth: [birth year]  [facility] CM/SW Contact  Nakesha Ebrahim, Lenice Llamas Phone Number: [phone]  05/16/2019, 9:24 AM  Clinical Narrative: Per Liliane Channel admissions coordinator at [facility] he is able to make a bed offer and accept patient Monday 7/20. Patient is agreeable to D/C to [facility]. [facility] is agreeable to [facility] if patient needs SNF level of care after surgery. PASARR is pending. CSW will continue to follow and assist as needed.   Magda Paganini level 2 PASARR screener called [place] representative (CSW) today and completed assessment. Per Magda Paganini she will submit her report to the state today.    Expected Discharge Plan: [place] Barriers to Discharge: Continued Medical Work up  Expected Discharge Plan and Services Expected Discharge Plan: [place] In-house Referral: Clinical Social Work Discharge Planning Services: CM Consult Post Acute Care Choice: [place] Living arrangements for the past 2 months: Apartment                                       Social Determinants of Health (SDOH) Interventions    Readmission Risk Interventions No flowsheet data found.

## 2019-05-16 NOTE — Progress Notes (Signed)
[place] at [place] NAME: [name] Munoz    MR#:  [id number]  DATE OF BIRTH:  [birth year]  SUBJECTIVE:  CHIEF COMPLAINT:   Chief Complaint  Patient presents with  . Fall  No complaints.  Pleasant.  Would like some ice chips if possible REVIEW OF SYSTEMS:  Review of Systems  Constitutional: Negative for chills, fever and weight loss.  HENT: Negative for nosebleeds and sore throat.   Eyes: Negative for blurred vision.  Respiratory: Negative for cough, shortness of breath and wheezing.   Cardiovascular: Negative for chest pain, orthopnea, leg swelling and PND.  Gastrointestinal: Negative for abdominal pain, constipation, diarrhea, heartburn, nausea and vomiting.  Genitourinary: Negative for dysuria and urgency.  Musculoskeletal: Positive for joint pain. Negative for back pain.  Skin: Negative for rash.  Neurological: Negative for dizziness, speech change, focal weakness and headaches.  Endo/Heme/Allergies: Does not bruise/bleed easily.  Psychiatric/Behavioral: Negative for depression.    DRUG ALLERGIES:  No Known Allergies VITALS:  Blood pressure (!) 120/45, pulse 71, temperature 98.2 F (36.8 C), temperature source Oral, resp. rate 18, height 5\' 9"  (1.753 m), weight 119.3 kg, SpO2 99 %. PHYSICAL EXAMINATION:  Physical Exam HENT:     Head: Normocephalic and atraumatic.  Eyes:     Conjunctiva/sclera: Conjunctivae normal.     Pupils: Pupils are equal, round, and reactive to light.  Neck:     Musculoskeletal: Normal range of motion and neck supple.     Thyroid: No thyromegaly.     Trachea: No tracheal deviation.  Cardiovascular:     Rate and Rhythm: Normal rate and regular rhythm.     Heart sounds: Normal heart sounds.  Pulmonary:     Effort: Pulmonary effort is normal. No respiratory distress.     Breath sounds: Normal breath sounds. No wheezing.  Chest:     Chest wall: No tenderness.  Abdominal:     General: Bowel sounds are  normal. There is no distension.     Palpations: Abdomen is soft.     Tenderness: There is no abdominal tenderness.  Musculoskeletal: Normal range of motion.     Comments: She has tenderness to palpation of the right knee particularly the lateral proximal tibia.  She also has tenderness palpation of the lateral malleolus.    Skin:    General: Skin is warm and dry.     Findings: No rash.  Neurological:     Mental Status: She is alert and oriented to person, place, and time.     Cranial Nerves: No cranial nerve deficit.    LABORATORY PANEL:  Female CBC Recent Labs  Lab 05/16/19 0400  WBC 6.0  HGB 10.2*  HCT 33.3*  PLT 100*   ------------------------------------------------------------------------------------------------------------------ Chemistries  Recent Labs  Lab 05/15/19 0439 05/16/19 0400  NA 139 138  K 4.2 4.8  CL 106 108  CO2 23 24  GLUCOSE 375* 217*  BUN 45* 33*  CREATININE 1.72* 1.44*  CALCIUM 8.5* 8.1*  AST 16  --   ALT 12  --   ALKPHOS 115  --   BILITOT 1.2  --    RADIOLOGY:  No results found. ASSESSMENT AND PLAN:  68 y f with tibial # s/p fall  * Right lateral tibial plateau fracture/Nondisplaced right lateral malleolar fracture - d/w dr Marry Guan y'day, hold plavix and sq heparin.  Continue baby aspirin -Plan for surgery later today.  medically clear for planned surgery  * DM - ssi, DM  nurse following  * Depression: continue home meds  * HTN: continue home meds. Adjust meds as need     All the records are reviewed and case discussed with Care Management/Social Worker. Management plans discussed with the patient, nursing and they are in agreement.  CODE STATUS: Full Code  TOTAL TIME TAKING CARE OF THIS PATIENT: 35 minutes.   More than 50% of the time was spent in counseling/coordination of care: YES  POSSIBLE D/C IN 2-3 DAYS, DEPENDING ON CLINICAL CONDITION.   Delfino LovettVipul Pratt Bress M.D on 05/16/2019 at 2:17 PM  Between 7am to 6pm - Pager -  [phone]  After 6pm go to [email or web address] - Social research officer, governmentpassword EPAS [facility]  [facility] [place] [facility]  Office  [phone]  CC: Primary care physician; Delton PrairieBrooks, Keatah B, FNP  Note: This dictation was prepared with Dragon dictation along with smaller phrase technology. Any transcriptional errors that result from this process are unintentional.

## 2019-05-16 NOTE — Anesthesia Post-op Follow-up Note (Signed)
Anesthesia QCDR form completed.        

## 2019-05-16 NOTE — Op Note (Signed)
OPERATIVE NOTE  DATE OF SURGERY:  05/16/2019  PATIENT NAME:  [name] Munoz   DOB: [birth year]  MRN: [id number]   PRE-OPERATIVE DIAGNOSIS: Right lateral tibial plateau fracture  POST-OPERATIVE DIAGNOSIS:  Same  PROCEDURE: Open reduction internal fixation of the right lateral tibial plateau fracture  SURGEON:  Marciano Sequin., M.D.   ANESTHESIA: general  ESTIMATED BLOOD LOSS: 100 mL  FLUIDS REPLACED: 1000 mL of crystalloid  TOURNIQUET TIME: 24 minutes   DRAINS: None  IMPLANTS UTILIZED:  Biomet 3-hole standard right proximal tibial plate, 9 -3.5 mm cortical locking screws,1 -3.5 mm cortical screw  INDICATIONS FOR SURGERY: [name] Munoz is a 73 y.o. year old female who fell and sustained a right lateral tibial plateau fracture.. After discussion of the risks and benefits of surgical intervention, the patient expressed understanding of the risks benefits and agree with plans for open reduction and internal fixation of the right lateral tibial plateau fracture.   PROCEDURE IN DETAIL: The patient was brought into the operating room and, after adequate general anesthesia was achieved, the patient was transferred to the radiolucent table.  A tourniquet was applied to the patient's upper right thigh.  Patient's right knee and leg were cleaned and prepped with alcohol and DuraPrep and draped in usual sterile fashion.  A "timeout" was performed as per usual protocol.  The right lower extremity was exsanguinated using an Esmarch, tourniquet was inflated to 300 mmHg.  A lateral utility incision was made centered over Gerdy's tubercle and extended distally and proximally.  It was felt that there was a venous tourniquet and the tourniquet was subsequently deflated after tourniquet time of 24 minutes.  The fascia lata was incised and dissection was carried out so as to visualize the lateral aspect of the tibia.   The fracture site was identified and distracted so as to elevate the depressed fragment with  a bone tamp.The meniscotibial attachment was incised so as to visualize the articular surface.  There appeared to be good reduction of the articular surface.   The lateral fragment was then reduced.  A 3 hole Biomet proximal tibial plate was positioned and provisionally stabilized using K wires both proximally and distally.  Good position of the plate was documented in both the AP and lateral planes using the C arm.  A 3.5 mm cortical screw was inserted into the distalmost hole.  Next, the proximal row of four 3.5 mm cortical locking screws were inserted followed by three 3.5 mm cortical locking screws in the distal row.  Good position of the screws was noted using a C arm.  Next, the 2 oblique "kickstand" screws were inserted.  The hardware was in good position and there was good restoration of the joint surface.  The wound was irrigated with copious amounts of normal saline with antibiotic solution and suctioned dry.  Good hemostasis was appreciated.  The meniscal attachment of the lateral meniscus was repaired.  The fascia lata was repaired using interrupted sutures of #1 Vicryl.  Subcutaneous tissue was approximated using first #0 Vicryl followed by #2-0 Vicryl.  Skin was closed with skin staples.  A sterile dressing was applied followed by application of a knee range of motion brace locked in extension.  The patient tolerated procedure well.  She was transported to the recovery room in stable condition.   P. Holley Bouche M.D.

## 2019-05-16 NOTE — Transfer of Care (Signed)
Immediate Anesthesia Transfer of Care Note  Patient: [name] Munoz  Procedure(s) Performed: OPEN REDUCTION INTERNAL FIXATION (ORIF) TIBIAL PLATEAU (Right Leg Lower)  Patient Location: PACU  Anesthesia Type:General  Level of Consciousness: awake  Airway & Oxygen Therapy: Patient Spontanous Breathing and Patient connected to face mask oxygen  Post-op Assessment: Report given to RN and Post -op Vital signs reviewed and stable  Post vital signs: Reviewed and stable  Last Vitals:  Vitals Value Taken Time  BP    Temp    Pulse    Resp    SpO2      Last Pain:  Vitals:   05/16/19 1400  TempSrc:   PainSc: 3       Patients Stated Pain Goal: 0 (08/23/84 2778)  Complications: No apparent anesthesia complications

## 2019-05-16 NOTE — Progress Notes (Signed)
Inpatient Diabetes Program Recommendations  AACE/ADA: New Consensus Statement on Inpatient Glycemic Control (2015)  Target Ranges:  Prepandial:   less than 140 mg/dL      Peak postprandial:   less than 180 mg/dL (1-2 hours)      Critically ill patients:  140 - 180 mg/dL   Results for [name] Munoz, [name] Munoz (MRN [id number]) as of 05/16/2019 08:32  Ref. Range 05/15/2019 11:59 05/15/2019 16:56 05/15/2019 21:24  Glucose-Capillary Latest Ref Range: 70 - 99 mg/dL 243 (H)  7 units NOVOLOG +  10 units LANTUS  137 (H)  1 unit NOVOLOG  242 (H)  2 units NOVOLOG    Results for [name] Munoz, [name] Munoz (MRN [id number]) as of 05/16/2019 08:32  Ref. Range 05/16/2019 07:38  Glucose-Capillary Latest Ref Range: 70 - 99 mg/dL 280 (H)   Results for [name] Munoz, [name] Munoz (MRN [id number]) as of 05/16/2019 08:32  Ref. Range 10/17/2018 13:11 05/15/2019 04:39  Hemoglobin A1C Latest Ref Range: 4.8 - 5.6 % 12.1 (H) 7.8 (H)     Home DM Meds: Lantus 10 units Daily       Actos 15 mg Daily   Current Orders: Lantus 10 units Daily      Novolog Sensitive Correction Scale/ SSI (0-9 units) TID AC + HS      Actos 15 mg Daily      A1c much improved since December.  Note Lantus 10 units Daily started yesterday at 12pm along with the Novolog SSI.     MD- I saw that pt's CBG was elevated to 280 mg/dl this AM.  I think this elevation stems from the fact that patient received a pre-surgical Ensure drink this AM which does have carbohydrates and may have elevated the AM CBG (see nursing notes).  Do not recommend Lantus adjustment yet since CBG this AM was not a true fasting CBG.     --Will follow patient during hospitalization--  Wyn Quaker RN, MSN, CDE Diabetes Coordinator Inpatient Glycemic Control Team Team Pager: [phone] (8a-5p)

## 2019-05-16 NOTE — Anesthesia Preprocedure Evaluation (Addendum)
Anesthesia Evaluation  Patient identified by MRN, date of birth, ID band Patient awake    Reviewed: Allergy & Precautions, H&P , NPO status , Patient's Chart, lab work & pertinent test results  Airway Mallampati: II  TM Distance: >3 FB     Dental  (+) Edentulous Lower, Edentulous Upper   Pulmonary neg pulmonary ROS,           Cardiovascular hypertension, + CAD  (-) CHF      Neuro/Psych PSYCHIATRIC DISORDERS Depression negative neurological ROS     GI/Hepatic Neg liver ROS, GERD  Controlled,  Endo/Other  diabetes  Renal/GU      Musculoskeletal   Abdominal   Peds  Hematology negative hematology ROS (+)   Anesthesia Other Findings Obesity  Past Medical History: No date: Coronary artery disease No date: Depression No date: Diabetes mellitus without complication ([facility]) No date: GERD (gastroesophageal reflux disease) No date: Hypertension  Past Surgical History: 09/20/2015: ESOPHAGOGASTRODUODENOSCOPY; N/A     Comment:  Procedure: ESOPHAGOGASTRODUODENOSCOPY (EGD);  Surgeon:               Manya Silvas, MD;  Location: [facility] ENDOSCOPY;                Service: Endoscopy;  Laterality: N/A; No date: PERCUTANEOUS CORONARY ROTOBLATOR INTERVENTION (PCI-R)  BMI    Body Mass Index: 38.84 kg/m      Reproductive/Obstetrics negative OB ROS                           Anesthesia Physical Anesthesia Plan  ASA: III  Anesthesia Plan: General ETT   Post-op Pain Management:    Induction:   PONV Risk Score and Plan: Ondansetron, Dexamethasone and Treatment may vary due to age or medical condition  Airway Management Planned:   Additional Equipment:   Intra-op Plan:   Post-operative Plan:   Informed Consent: I have reviewed the patients History and Physical, chart, labs and discussed the procedure including the risks, benefits and alternatives for the proposed anesthesia with the patient or  authorized representative who has indicated his/her understanding and acceptance.     Dental Advisory Given  Plan Discussed with: Anesthesiologist and CRNA  Anesthesia Plan Comments:         Anesthesia Quick Evaluation

## 2019-05-16 NOTE — Progress Notes (Signed)
Pre surgery ensure administered to patient. She completed prior to NPO orders at 0500.

## 2019-05-16 NOTE — Anesthesia Postprocedure Evaluation (Signed)
Anesthesia Post Note  Patient: [name] Munoz  Procedure(s) Performed: OPEN REDUCTION INTERNAL FIXATION (ORIF) TIBIAL PLATEAU (Right Leg Lower)  Patient location during evaluation: PACU Anesthesia Type: General Level of consciousness: awake and alert Pain management: pain level controlled Vital Signs Assessment: post-procedure vital signs reviewed and stable Respiratory status: spontaneous breathing, nonlabored ventilation and respiratory function stable Cardiovascular status: blood pressure returned to baseline and stable Postop Assessment: no apparent nausea or vomiting Anesthetic complications: no     Last Vitals:  Vitals:   05/16/19 2203 05/16/19 2212  BP:  (!) 178/72  Pulse: 78 84  Resp: 14 16  Temp:  37 C  SpO2: 92% 94%    Last Pain:  Vitals:   05/16/19 2212  TempSrc:   PainSc: [place]

## 2019-05-17 LAB — GLUCOSE, CAPILLARY
Glucose-Capillary: 249 mg/dL — ABNORMAL HIGH (ref 70–99)
Glucose-Capillary: 208 mg/dL — ABNORMAL HIGH (ref 70–99)
Glucose-Capillary: 157 mg/dL — ABNORMAL HIGH (ref 70–99)
Glucose-Capillary: 172 mg/dL — ABNORMAL HIGH (ref 70–99)

## 2019-05-17 LAB — CBC WITH DIFFERENTIAL/PLATELET
Abs Immature Granulocytes: 0.04 10*3/uL (ref 0.00–0.07)
Basophils Absolute: 0 10*3/uL (ref 0.0–0.1)
Basophils Relative: 0 %
Eosinophils Absolute: 0 10*3/uL (ref 0.0–0.5)
Eosinophils Relative: 0 %
HCT: 30 % — ABNORMAL LOW (ref 36.0–46.0)
Hemoglobin: 9.3 g/dL — ABNORMAL LOW (ref 12.0–15.0)
Immature Granulocytes: 1 %
Lymphocytes Relative: 7 %
Lymphs Abs: 0.4 10*3/uL — ABNORMAL LOW (ref 0.7–4.0)
MCH: 29.2 pg (ref 26.0–34.0)
MCHC: 31 g/dL (ref 30.0–36.0)
MCV: 94.3 fL (ref 80.0–100.0)
Monocytes Absolute: 0.4 10*3/uL (ref 0.1–1.0)
Monocytes Relative: 7 %
Neutro Abs: 5 10*3/uL (ref 1.7–7.7)
Neutrophils Relative %: 85 %
Platelets: 87 10*3/uL — ABNORMAL LOW (ref 150–400)
RBC: 3.18 MIL/uL — ABNORMAL LOW (ref 3.87–5.11)
RDW: 13.2 % (ref 11.5–15.5)
WBC: 5.8 10*3/uL (ref 4.0–10.5)
nRBC: 0 % (ref 0.0–0.2)

## 2019-05-17 LAB — BASIC METABOLIC PANEL
Anion gap: 7 (ref 5–15)
BUN: 25 mg/dL — ABNORMAL HIGH (ref 8–23)
CO2: 23 mmol/L (ref 22–32)
Calcium: 8 mg/dL — ABNORMAL LOW (ref 8.9–10.3)
Chloride: 108 mmol/L (ref 98–111)
Creatinine, Ser: 1.06 mg/dL — ABNORMAL HIGH (ref 0.44–1.00)
GFR calc Af Amer: >60 mL/min (ref >60)
GFR calc non Af Amer: 53 mL/min — ABNORMAL LOW (ref >60)
Glucose, Bld: 274 mg/dL — ABNORMAL HIGH (ref 70–99)
Potassium: 4.7 mmol/L (ref 3.5–5.1)
Sodium: 138 mmol/L (ref 135–145)

## 2019-05-17 MED ORDER — ENOXAPARIN SODIUM 40 MG/0.4ML ~~LOC~~ SOLN
40.0000 mg | SUBCUTANEOUS | Status: DC
  Administered 2019-05-18 – 2019-05-19 (×2): 40 mg via SUBCUTANEOUS
  Filled 2019-05-17 (×2): qty 0.4

## 2019-05-17 NOTE — Plan of Care (Signed)

## 2019-05-17 NOTE — Evaluation (Signed)
Physical Therapy Evaluation Patient Details Name: [name] Munoz MRN: [id number] DOB: 10/20/1946 Today's Date: 05/17/2019   History of Present Illness  Pt presented to ED for a fall, s/p open reduction internal fixation of tibial plateau (R).  PMH of HTN, CAD, depression, GERD, DM, CKD, obesity. Pt also with Nondisplaced right lateral malleolar fracture    Clinical Impression  Patient alert, oriented, behavior WFLs, some HOH noted. Patient reported living in a one story 2nd floor apartment alone, previously utilized rollator 75% for household ambulation, consistently for community ambulation, does not drive, but utilizes transport [employer] for groceries, an aide x1 a wk provides cleaning. No other falls in the last 6 months except this fall that precipitated hospital admission.  Pt demonstrated ability to perform SLR bilaterally, and educated about NWB on RLE as well as knee immobilizer, which was in place at start of session locked in extension. Pt demonstrated bed mobility minA 1-2 person assist. Sit <> Stand attempted x3 this session. Verbal and visual cues provided for hand placement, weight shifting, RW management, but unable to clear buttocks with maxAx1.  Overall the patient demonstrated deficits (see "PT Problem List") that impede the patient's functional abilities, safety, and mobility and would benefit from skilled PT intervention. Recommendation is STR to address these deficits, maximize safety, and continued education.     Follow Up Recommendations SNF    Equipment Recommendations  Other (comment)(TBD)    Recommendations for Other Services       Precautions / Restrictions Precautions Precautions: Fall Restrictions Weight Bearing Restrictions: Yes RLE Weight Bearing: Non weight bearing      Mobility  Bed Mobility Overal bed mobility: Needs Assistance Bed Mobility: Supine to Sit;Sit to Supine     Supine to sit: Min assist;HOB elevated Sit to supine: HOB elevated;Min assist;+2  for physical assistance   General bed mobility comments: supine to sit with minA for RLE management. sit to supine minAx2 for safety, minA to reposition in bed  Transfers Overall transfer level: Needs assistance Equipment used: Rolling walker (2 wheeled) Transfers: Sit to/from Stand Sit to Stand: Max assist         General transfer comment: atttempted x3 trials this session, verbal and visual cues for hand placement, weight shift, weight bearing precautions. Unable to clear buttocks from bed  Ambulation/Gait                Stairs            Wheelchair Mobility    Modified Rankin (Stroke Patients Only)       Balance Overall balance assessment: Needs assistance Sitting-balance support: Feet supported;Bilateral upper extremity supported Sitting balance-Leahy Scale: Fair                                       Pertinent Vitals/Pain Pain Assessment: 0-10 Pain Score: 3  Pain Location: end of session, in R knee Pain Descriptors / Indicators: Sore Pain Intervention(s): Limited activity within patient's tolerance;Monitored during session;Repositioned    Home Living Family/patient expects to be discharged to:: Private residence Living Arrangements: Alone Available Help at Discharge: Friend(s);Available PRN/intermittently;Other (Comment)(an aide x1 a week for cleaning) Type of Home: Apartment Home Access: Ramped entrance;Elevator     Home Layout: One level Home Equipment: Walker - 4 wheels;Grab bars - tub/shower;Grab bars - toilet;Toilet riser;Shower seat      Prior Function Level of Independence: Independent with assistive device(s)  Comments: PACE program, no other falls in the last two weeks     Hand Dominance   Dominant Hand: Right    Extremity/Trunk Assessment   Upper Extremity Assessment Upper Extremity Assessment: Overall WFL for tasks assessed    Lower Extremity Assessment Lower Extremity Assessment: RLE  deficits/detail;LLE deficits/detail RLE Deficits / Details: Able to perform SLR RLE: Unable to fully assess due to immobilization;Unable to fully assess due to pain LLE Deficits / Details: able to perform SLR, Heel slide, ankle DF/PF WFLs    Cervical / Trunk Assessment Cervical / Trunk Assessment: Normal  Communication   Communication: No difficulties  Cognition Arousal/Alertness: Awake/alert Behavior During Therapy: WFL for tasks assessed/performed Overall Cognitive Status: Within Functional Limits for tasks assessed                                        General Comments      Exercises General Exercises - Lower Extremity Ankle Circles/Pumps: AROM;Left;5 reps Quad Sets: AROM;Left;5 reps Gluteal Sets: AROM;Both;5 reps Straight Leg Raises: AROM;Right;10 reps   Assessment/Plan    PT Assessment Patient needs continued PT services  PT Problem List Decreased mobility;Decreased strength;Decreased safety awareness;Decreased range of motion;Decreased knowledge of precautions;Decreased activity tolerance;Decreased balance;Decreased knowledge of use of DME;Pain       PT Treatment Interventions DME instruction;Therapeutic exercise;Gait training;Balance training;Stair training;Neuromuscular re-education;Functional mobility training;Therapeutic activities;Patient/family education    PT Goals (Current goals can be found in the Care Plan section)  Acute Rehab PT Goals Patient Stated Goal: to return to PLOF PT Goal Formulation: With patient Time For Goal Achievement: 05/31/19 Potential to Achieve Goals: Fair    Frequency BID   Barriers to discharge Inaccessible home environment;Decreased caregiver support      Co-evaluation               AM-PAC PT "6 Clicks" Mobility  Outcome Measure Help needed turning from your back to your side while in a flat bed without using bedrails?: A Lot Help needed moving from lying on your back to sitting on the side of a flat  bed without using bedrails?: A Lot Help needed moving to and from a bed to a chair (including a wheelchair)?: A Lot Help needed standing up from a chair using your arms (e.g., wheelchair or bedside chair)?: A Lot Help needed to walk in hospital room?: A Lot Help needed climbing 3-5 steps with a railing? : A Lot 6 Click Score: 12    End of Session Equipment Utilized During Treatment: Gait belt Activity Tolerance: Patient tolerated treatment well Patient left: in bed;with call bell/phone within reach;with bed alarm set;with SCD's reapplied Nurse Communication: Mobility status PT Visit Diagnosis: Unsteadiness on feet (R26.81);Muscle weakness (generalized) (M62.81);Other abnormalities of gait and mobility (R26.89);Pain Pain - Right/Left: Right Pain - part of body: Knee    Time: 0928-1009 PT Time Calculation (min) (ACUTE ONLY): 41 min   Charges:   PT Evaluation $PT Eval Low Complexity: 1 Low PT Treatments $Therapeutic Exercise: 8-22 mins $Therapeutic Activity: 8-22 mins       Olga Coasteriana Cellie Dardis PT, DPT 10:30 AM,05/17/19 [phone]

## 2019-05-17 NOTE — Progress Notes (Signed)
Physical Therapy Treatment Patient Details Name: [name] NeatSusan Gilland MRN: [id number] DOB: 02-19-1946 Today's Date: 05/17/2019    History of Present Illness Pt presented to ED for a fall, s/p open reduction internal fixation of tibial plateau (R).  PMH of HTN, CAD, depression, GERD, DM, CKD, obesity. Pt also with Nondisplaced right lateral malleolar fracture    PT Comments    Patient easily woken, agreeable to PT reported pain as manageable at rest. Session focused on therapeutic exercise; bed level exercises performed with verbal and visual cues, pt with good quad set and SLR on RLE. Bed mobility minA1-2 person assist for safety/LE management. Pt able to sit EOB with fair sitting balance. Pt able to perform posterior scooting well with UE support (bed rails), but has difficulty with hip clearance to scoot laterally (to the R) and fatigued quickly/complained of increased R hip pain. Next session pt may benefit from attempting L lateral scoot in preparation for WC transfers. The patient would benefit from further skilled PT to continue maximize pt independence, safety, and mobility.      Follow Up Recommendations  SNF     Equipment Recommendations  Other (comment)    Recommendations for Other Services       Precautions / Restrictions Precautions Precautions: Fall Required Braces or Orthoses: Other Brace;Knee Immobilizer - Right Knee Immobilizer - Right: On at all times;Other (comment)(locked in extension) Restrictions Weight Bearing Restrictions: Yes RLE Weight Bearing: Non weight bearing    Mobility  Bed Mobility Overal bed mobility: Needs Assistance Bed Mobility: Supine to Sit;Sit to Supine     Supine to sit: Min assist Sit to supine: Min assist;+2 for safety/equipment   General bed mobility comments: minA for RLE management, MinA for trunk control to return to supine safely. Cues to return to sidelying  Transfers Overall transfer level: Needs assistance Equipment used: Rolling  walker (2 wheeled) Transfers: Research officer, political partyAnterior-Posterior Transfer;Lateral/Scoot Transfers       Anterior-Posterior transfers: Min guard  Lateral/Scoot Transfers: Min guard General transfer comment: session focused on promoting scooting in prep for lateral transfers to [facility], pt with good ability to scoot anteriorly/posteriorly especially with bed rails, 2-3 instances of ability to scoot to R with bed rails as well. Pt quickly fatigued and complained of increased pain.  Ambulation/Gait             General Gait Details: deferred   Stairs             Wheelchair Mobility    Modified Rankin (Stroke Patients Only)       Balance Overall balance assessment: Needs assistance Sitting-balance support: Feet supported;Bilateral upper extremity supported Sitting balance-Leahy Scale: Fair                                      Cognition Arousal/Alertness: Awake/alert Behavior During Therapy: WFL for tasks assessed/performed Overall Cognitive Status: Within Functional Limits for tasks assessed                                        Exercises General Exercises - Lower Extremity Ankle Circles/Pumps: AROM;Left;10 reps Quad Sets: AROM;Both;10 reps Heel Slides: AROM;Left;10 reps Hip ABduction/ADduction: AROM;Both;10 reps Straight Leg Raises: AROM;Right;10 reps    General Comments        Pertinent Vitals/Pain Pain Assessment: Faces Pain Score: 3  Faces Pain Scale: Hurts  little more Pain Location: pt reported R hip was hurting when attempted lateral scooting Pain Descriptors / Indicators: Sore Pain Intervention(s): Limited activity within patient's tolerance;Monitored during session;Repositioned;Ice applied    Home Living Family/patient expects to be discharged to:: Private residence Living Arrangements: Alone Available Help at Discharge: Friend(s);Available PRN/intermittently;Other (Comment) Type of Home: Apartment Home Access: Ramped  entrance;Elevator   Home Layout: One level Home Equipment: Walker - 4 wheels;Grab bars - tub/shower;Grab bars - toilet;Toilet riser;Shower seat      Prior Function Level of Independence: Independent with assistive device(s)      Comments: PACE program, no other falls in the last two weeks   PT Goals (current goals can now be found in the care plan section) Acute Rehab PT Goals Patient Stated Goal: Patient would like to be as independent as she was before Progress towards PT goals: Progressing toward goals    Frequency    BID      PT Plan Current plan remains appropriate    Co-evaluation              AM-PAC PT "6 Clicks" Mobility   Outcome Measure  Help needed turning from your back to your side while in a flat bed without using bedrails?: A Lot Help needed moving from lying on your back to sitting on the side of a flat bed without using bedrails?: A Lot Help needed moving to and from a bed to a chair (including a wheelchair)?: A Lot Help needed standing up from a chair using your arms (e.g., wheelchair or bedside chair)?: A Lot Help needed to walk in hospital room?: Total Help needed climbing 3-5 steps with a railing? : Total 6 Click Score: 10    End of Session Equipment Utilized During Treatment: Gait belt Activity Tolerance: Patient limited by fatigue Patient left: in bed;with call bell/phone within reach;with bed alarm set;with SCD's reapplied Nurse Communication: Mobility status PT Visit Diagnosis: Unsteadiness on feet (R26.81);Muscle weakness (generalized) (M62.81);Other abnormalities of gait and mobility (R26.89);Pain Pain - Right/Left: Right Pain - part of body: Knee     Time: 1779-3903 PT Time Calculation (min) (ACUTE ONLY): 30 min  Charges:  $Therapeutic Exercise: 23-37 mins                     Lieutenant Diego PT, DPT 2:49 PM,05/17/19 [phone]

## 2019-05-17 NOTE — Progress Notes (Signed)
  Subjective: 1 Day Post-Op Procedure(s) (LRB): OPEN REDUCTION INTERNAL FIXATION (ORIF) TIBIAL PLATEAU (Right) Patient reports pain as mild.   Patient seen in rounds with Dr. Rudene Christians. Patient is well, and has had no acute complaints or problems Plan is to go Home versus rehab, depending on care management after hospital stay. Negative for chest pain and shortness of breath Fever: no Gastrointestinal: Negative for nausea and vomiting  Objective: Vital signs in last 24 hours: Temp:  [98 F (36.7 C)-98.8 F (37.1 C)] 98 F (36.7 C) (07/18 0358) Pulse Rate:  [67-84] 72 (07/18 0358) Resp:  [12-20] 18 (07/18 0358) BP: (120-178)/(45-89) 154/54 (07/18 0358) SpO2:  [90 %-100 %] 98 % (07/18 0250) Weight:  [126.6 kg] 126.6 kg (07/18 0452)  Intake/Output from previous day:  Intake/Output Summary (Last 24 hours) at 05/17/2019 0629 Last data filed at 05/17/2019 7425 Gross per 24 hour  Intake 2510.4 ml  Output 1920 ml  Net 590.4 ml    Intake/Output this shift: Total I/O In: 1750 [I.V.:1350; IV Piggyback:400] Out: 1920 [Urine:1820; Blood:100]  Labs: Recent Labs    05/15/19 0439 05/16/19 0400  HGB 11.5* 10.2*   Recent Labs    05/15/19 0439 05/16/19 0400  WBC 7.2 6.0  RBC 3.94 3.55*  HCT 37.1 33.3*  PLT 114* 100*   Recent Labs    05/15/19 0439 05/16/19 0400  NA 139 138  K 4.2 4.8  CL 106 108  CO2 23 24  BUN 45* 33*  CREATININE 1.72* 1.44*  GLUCOSE 375* 217*  CALCIUM 8.5* 8.1*   Recent Labs    05/15/19 0439  INR 1.0     EXAM General - Patient is Alert and Oriented Extremity - Neurovascular intact Sensation intact distally Compartment soft Dressing/Incision - clean, dry, no drainage, with hinged brace locked in extension. Motor Function - intact, moving foot and toes well on exam.   Past Medical History:  Diagnosis Date  . Coronary artery disease   . Depression   . Diabetes mellitus without complication ([facility])   . GERD (gastroesophageal reflux disease)   .  Hypertension     Assessment/Plan: 1 Day Post-Op Procedure(s) (LRB): OPEN REDUCTION INTERNAL FIXATION (ORIF) TIBIAL PLATEAU (Right) Active Problems:   Tibial fracture  Estimated body mass index is 41.2 kg/m as calculated from the following:   Height as of this encounter: 5\' 9"  (1.753 m).   Weight as of this encounter: 126.6 kg. Advance diet Up with therapy D/C IV fluids  Discharge planning to home versus rehab.  DVT Prophylaxis - Lovenox Non-weight-Bearing to right leg  Reche Dixon, PA-C Orthopaedic Surgery 05/17/2019, 6:29 AM

## 2019-05-17 NOTE — Evaluation (Signed)
Occupational Therapy Evaluation Patient Details Name: [name] Munoz MRN: [id number] DOB: 06-12-1946 Today's Date: 05/17/2019    History of Present Illness Pt presented to ED for a fall, s/p open reduction internal fixation of tibial plateau (R).  PMH of HTN, CAD, depression, GERD, DM, CKD, obesity. Pt also with Nondisplaced right lateral malleolar fracture   Clinical Impression   Patient lives alone in an apartment, was independent with basic self care prior to admission, assist with housecleaning, transportation and medication management from [facility].  Patient s/p ORIF right tibial plateau and nondisplaced right lateral malleolar fracture, she presents with muscle weakness, decreased balance, decreased transfers, pain, decreased ability to perform self care and IADL tasks.  Patient would benefit from skilled OT services to maximize safety and independence in necessary daily tasks.  Since patient lives alone and is NWB, she will require short term rehab.      Follow Up Recommendations  SNF    Equipment Recommendations       Recommendations for Other Services       Precautions / Restrictions Precautions Precautions: Fall Required Braces or Orthoses: Other Brace Restrictions Weight Bearing Restrictions: Yes RLE Weight Bearing: Non weight bearing      Mobility Bed Mobility Overal bed mobility: Needs Assistance Bed Mobility: Supine to Sit;Sit to Supine     Supine to sit: Min assist;HOB elevated Sit to supine: HOB elevated;Min assist;+2 for physical assistance   General bed mobility comments: supine to sit with minA for RLE management. sit to supine minAx2 for safety, minA to reposition in bed  Transfers Overall transfer level: Needs assistance Equipment used: Rolling walker (2 wheeled) Transfers: Sit to/from Stand Sit to Stand: Max assist         General transfer comment: patient unable to perform sit to stand this date per PT and will require +2 assist    Balance  Overall balance assessment: Needs assistance Sitting-balance support: Feet supported;Bilateral upper extremity supported Sitting balance-Leahy Scale: Fair                                     ADL either performed or assessed with clinical judgement   ADL Overall ADL's : Needs assistance/impaired Eating/Feeding: Set up   Grooming: Set up Grooming Details (indicate cue type and reason): unable to stand to perform at the sink Upper Body Bathing: Set up   Lower Body Bathing: Maximal assistance;Bed level Lower Body Bathing Details (indicate cue type and reason): from bed level Upper Body Dressing : Set up;Bed level   Lower Body Dressing: Maximal assistance;+2 for physical assistance   Toilet Transfer: +2 for physical assistance Toilet Transfer Details (indicate cue type and reason): patient is NWB and was not able to transfer at eval Toileting- Clothing Manipulation and Hygiene: +2 for physical assistance       Functional mobility during ADLs: +2 for physical assistance General ADL Comments: Patient was independent with basic self care tasks prior to admission.  She has an aide who comes in once a week to perform heavier cleaning tasks and her aide was on vacation so patient attempted mopping the floor when she fell. She does not drive, she takes the [employer] van once a week to [employer] for grocery shopping.  [facility]  They buble pack her daily meds and she also sets an alarm to remind her of when to take meds.  Patient will  be required to wear immobilizer brace to RLE and NWB.  She is aware she will require rehab prior to returning home.     Vision Baseline Vision/History: Wears glasses       Perception     Praxis      Pertinent Vitals/Pain Pain Assessment: 0-10 Pain Score: 3  Pain Location: right knee Pain Descriptors / Indicators: Sore Pain Intervention(s): Limited activity within patient's tolerance;Monitored during  session;Repositioned     Hand Dominance Right   Extremity/Trunk Assessment Upper Extremity Assessment Upper Extremity Assessment: Overall WFL for tasks assessed   Lower Extremity Assessment Lower Extremity Assessment: Defer to PT evaluation RLE Deficits / Details: Able to perform SLR RLE: Unable to fully assess due to immobilization;Unable to fully assess due to pain LLE Deficits / Details: able to perform SLR, Heel slide, ankle DF/PF WFLs   Cervical / Trunk Assessment Cervical / Trunk Assessment: Normal   Communication Communication Communication: No difficulties   Cognition Arousal/Alertness: Awake/alert Behavior During Therapy: WFL for tasks assessed/performed Overall Cognitive Status: Within Functional Limits for tasks assessed                                     General Comments       Exercises General Exercises - Lower Extremity Ankle Circles/Pumps: AROM;Left;5 reps Quad Sets: AROM;Left;5 reps Gluteal Sets: AROM;Both;5 reps Straight Leg Raises: AROM;Right;10 reps   Shoulder Instructions      Home Living Family/patient expects to be discharged to:: Private residence Living Arrangements: Alone Available Help at Discharge: Friend(s);Available PRN/intermittently;Other (Comment) Type of Home: Apartment Home Access: Ramped entrance;Elevator     Home Layout: One level     Bathroom Shower/Tub: Teacher, early years/pre: Standard     Home Equipment: Walker - 4 wheels;Grab bars - tub/shower;Grab bars - toilet;Toilet riser;Shower seat          Prior Functioning/Environment Level of Independence: Independent with assistive device(s)        Comments: [facility] program, no other falls in the last two weeks        OT Problem List: Decreased strength;Decreased knowledge of use of DME or AE;Decreased range of motion;Decreased knowledge of precautions;Decreased activity tolerance;Impaired balance (sitting and/or standing);Pain      OT  Treatment/Interventions: Self-care/ADL training;Therapeutic exercise;Patient/family education;Balance training;Therapeutic activities;DME and/or AE instruction    OT Goals(Current goals can be found in the care plan section) Acute Rehab OT Goals Patient Stated Goal: Patient would like to be as independent as she was before OT Goal Formulation: With patient Time For Goal Achievement: 05/31/19 Potential to Achieve Goals: Good ADL Goals Pt Will Perform Lower Body Dressing: with mod assist Pt Will Transfer to Toilet: with mod assist  OT Frequency: Min 1X/week   Barriers to D/C:            Co-evaluation              AM-PAC OT "6 Clicks" Daily Activity     Outcome Measure Help from another person eating meals?: None Help from another person taking care of personal grooming?: A Little Help from another person toileting, which includes using toliet, bedpan, or urinal?: Total Help from another person bathing (including washing, rinsing, drying)?: A Lot Help from another person to put on and taking off regular upper body clothing?: A Little Help from another person to put on and taking off regular lower body clothing?: Total 6 Click Score:  14   End of Session Equipment Utilized During Treatment: Right knee immobilizer  Activity Tolerance: Patient tolerated treatment well Patient left: in bed;with call bell/phone within reach;with bed alarm set  OT Visit Diagnosis: Muscle weakness (generalized) (M62.81);Unsteadiness on feet (R26.81);Pain Pain - Right/Left: Right Pain - part of body: Leg                Time: 1057-1120 OT Time Calculation (min): 23 min Charges:  OT General Charges $OT Visit: 1 Visit OT Evaluation $OT Eval Low Complexity: 1 Low  Porshe Fleagle T Rhema Boyett, OTR/L, CLT   Shaindel Sweeten 05/17/2019, 1:34 PM

## 2019-05-17 NOTE — Progress Notes (Signed)
[facility] [place] NAME: [name] Munoz    MR#:  [id number]  DATE OF BIRTH:  23-Nov-1945  SUBJECTIVE:   Chief Complaint  Patient presents with  . Fall   Patient is seen at the bedside. Overall she feels her pain is well controlled. No issues overnight.   REVIEW OF SYSTEMS:  Review of Systems  Constitutional: Negative for chills, fever, malaise/fatigue and weight loss.  HENT: Negative for congestion, hearing loss and sore throat.   Eyes: Negative for blurred vision and double vision.  Respiratory: Negative for cough, shortness of breath and wheezing.   Cardiovascular: Negative for chest pain, palpitations, orthopnea and leg swelling.  Gastrointestinal: Negative for abdominal pain, diarrhea, nausea and vomiting.  Genitourinary: Negative for dysuria and urgency.  Musculoskeletal: Positive for joint pain. Negative for myalgias.  Skin: Negative for rash.  Neurological: Negative for dizziness, sensory change, speech change, focal weakness and headaches.  Psychiatric/Behavioral: Positive for depression. The patient is nervous/anxious.    DRUG ALLERGIES:   Allergies  Allergen Reactions  . Erythromycin   . Lamisil [Terbinafine Hcl]   . Tramadol Hcl   . Penicillins    VITALS:  Blood pressure (!) 140/56, pulse 74, temperature 97.8 F (36.6 C), temperature source Oral, resp. rate 16, height 5\' 9"  (1.753 m), weight 126.6 kg, SpO2 97 %. PHYSICAL EXAMINATION:   GENERAL:  73 y.o.-year-old patient lying in the bed with no acute distress.  EYES: Pupils equal, round, reactive to light and accommodation. No scleral icterus. Extraocular muscles intact.  HEENT: Head atraumatic, normocephalic. Oropharynx and nasopharynx clear.  NECK:  Supple, no jugular venous distention. No thyroid enlargement, no tenderness.  LUNGS: Normal breath sounds bilaterally, no wheezing, rales,rhonchi or crepitation. No use of accessory muscles of respiration.   CARDIOVASCULAR: S1, S2 normal. No murmurs, rubs, or gallops.  ABDOMEN: Soft, nontender, nondistended. Bowel sounds present. No organomegaly or mass.  EXTREMITIES: No pedal edema, cyanosis, or clubbing. No rash or lesions. + pedal pulses MUSCULOSKELETAL: Normal bulk, and power was 5+ grip and elbow, knee, and ankle flexion and extension on the LLE. Immobilizer to right leg  NEUROLOGIC:Alert and oriented x 3. CN 2-12 intact. Sensation to light touch and cold stimuli intact bilaterally. Finger to nose nl. Babinski is downgoing. DTR's (biceps, patellar, and achilles) 2+ and symmetric throughout. Gait not tested due to safety concern. PSYCHIATRIC: The patient is alert and oriented x 3.  SKIN: No obvious rash, lesion, or ulcer.   DATA REVIEWED:  LABORATORY PANEL:  Female CBC Recent Labs  Lab 05/17/19 0848  WBC 5.8  HGB 9.3*  HCT 30.0*  PLT 87*   ------------------------------------------------------------------------------------------------------------------ Chemistries  Recent Labs  Lab 05/15/19 0439  05/17/19 0848  NA 139   < > 138  K 4.2   < > 4.7  CL 106   < > 108  CO2 23   < > 23  GLUCOSE 375*   < > 274*  BUN 45*   < > 25*  CREATININE 1.72*   < > 1.06*  CALCIUM 8.5*   < > 8.0*  AST 16  --   --   ALT 12  --   --   ALKPHOS 115  --   --   BILITOT 1.2  --   --    < > = values in this interval not displayed.   RADIOLOGY:  Dg Tibia/fibula Right  Result Date: 05/16/2019 CLINICAL DATA:  ORIF of a comminuted LATERAL tibial  plateau fracture involving the RIGHT knee. EXAM: OPERATIVE RIGHT KNEE-2 VIEW COMPARISON:  RIGHT knee x-rays and CT yesterday. FINDINGS: Nine spot images from the C-arm fluoroscopic device, AP and LATERAL views of the RIGHT knee, were obtained at various points during the ORIF procedure and are submitted for interpretation postoperatively. These images demonstrate plate and screw fixation of the comminuted LATERAL tibial plateau fracture. The radiologic  technologist documented 2 minutes and 17 seconds of fluoroscopy time with a patient dose of 3.8 mGy. IMPRESSION: ORIF of the comminuted LATERAL tibial plateau fracture. Electronically Signed   By: Hulan Saashomas  Lawrence M.D.   On: 05/16/2019 20:59   Dg C-arm 1-60 Min-no Report  Result Date: 05/16/2019 Fluoroscopy was utilized by the requesting physician.  No radiographic interpretation.   ASSESSMENT AND PLAN:   73 y.o. female with past medical hx of DM, CAD, GERD, hypertension and depression presenting with severe pain in her right knee and ankle following a mechanical fall. She was found to have a right lateral tibial fracture.   1. Right tibial fracture - s/p Open reduction internal fixation of the right lateral tibial plateau fracture POD # 1 - Pain management PRN - Non-weight bearing to right leg, Immobilizer in place - Up with PT, recommends SNF - Ortho input appreciated  2. Coronary Artery Disease  - ASA 81mg  PO daily - Clopidogrel 75mg  PO daily  - HTN, HLD, DM control as below  3. HLD + Goal LDL<100 - Atorvastatin 40mg  PO qhs  4. HTN  + Goal BP <140/90 - Beta-blocker: Metoprolol - ACE-Inhibitor: Lisinopril  5.  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 DVT prophylaxis - Enoxaparin  SubQ due to poor mobility and stay >24hrs  7. Depression - Wellbutrin and Celexa  All the records are reviewed and case discussed with Care Management/Social Worker. Management plans discussed with the patient, family and they are in agreement.  CODE STATUS: Full Code  TOTAL TIME TAKING CARE OF THIS PATIENT: 35 minutes.   More than 50% of the time was spent in counseling/coordination of care: YES  POSSIBLE D/C IN 1 DAYS, DEPENDING ON CLINICAL CONDITION.   on 05/17/2019 at 2:47 PM  Webb SilversmithElizabeth Emaleigh Guimond, DNP, FNP-BC [facility] Nurse Practitioner Between 7am to 6pm -  Pager - [phone]  After 6pm go to [email or web address] - password [email or web address]  [facility] [place] [facility]  Office  [phone]  CC: Primary care physician; Delton PrairieBrooks, Keatah B, FNP  Note: This dictation was prepared with Dragon dictation along with smaller phrase technology. Any transcriptional errors that result from this process are unintentional.

## 2019-05-17 NOTE — Progress Notes (Signed)
Patient resting in bed, VSS. Dressing dry and intact on right leg. Brace in place. No complaints at this time. Bed in lowest position, call bell in reach. Continue to monitor.

## 2019-05-18 LAB — CBC WITH DIFFERENTIAL/PLATELET
Abs Immature Granulocytes: 0.05 10*3/uL (ref 0.00–0.07)
Basophils Absolute: 0 10*3/uL (ref 0.0–0.1)
Basophils Relative: 0 %
Eosinophils Absolute: 0.2 10*3/uL (ref 0.0–0.5)
Eosinophils Relative: 2 %
HCT: 30 % — ABNORMAL LOW (ref 36.0–46.0)
Hemoglobin: 9.3 g/dL — ABNORMAL LOW (ref 12.0–15.0)
Immature Granulocytes: 1 %
Lymphocytes Relative: 19 %
Lymphs Abs: 1.4 10*3/uL (ref 0.7–4.0)
MCH: 29.2 pg (ref 26.0–34.0)
MCHC: 31 g/dL (ref 30.0–36.0)
MCV: 94 fL (ref 80.0–100.0)
Monocytes Absolute: 0.7 10*3/uL (ref 0.1–1.0)
Monocytes Relative: 9 %
Neutro Abs: 5 10*3/uL (ref 1.7–7.7)
Neutrophils Relative %: 69 %
Platelets: 117 10*3/uL — ABNORMAL LOW (ref 150–400)
RBC: 3.19 MIL/uL — ABNORMAL LOW (ref 3.87–5.11)
RDW: 13.3 % (ref 11.5–15.5)
WBC: 7.3 10*3/uL (ref 4.0–10.5)
nRBC: 0 % (ref 0.0–0.2)

## 2019-05-18 LAB — GLUCOSE, CAPILLARY
Glucose-Capillary: 164 mg/dL — ABNORMAL HIGH (ref 70–99)
Glucose-Capillary: 164 mg/dL — ABNORMAL HIGH (ref 70–99)
Glucose-Capillary: 222 mg/dL — ABNORMAL HIGH (ref 70–99)

## 2019-05-18 LAB — BASIC METABOLIC PANEL
Anion gap: 7 (ref 5–15)
BUN: 27 mg/dL — ABNORMAL HIGH (ref 8–23)
CO2: 22 mmol/L (ref 22–32)
Calcium: 8.5 mg/dL — ABNORMAL LOW (ref 8.9–10.3)
Chloride: 108 mmol/L (ref 98–111)
Creatinine, Ser: 1.2 mg/dL — ABNORMAL HIGH (ref 0.44–1.00)
GFR calc Af Amer: 53 mL/min — ABNORMAL LOW (ref >60)
GFR calc non Af Amer: 45 mL/min — ABNORMAL LOW (ref >60)
Glucose, Bld: 173 mg/dL — ABNORMAL HIGH (ref 70–99)
Potassium: 4 mmol/L (ref 3.5–5.1)
Sodium: 137 mmol/L (ref 135–145)

## 2019-05-18 NOTE — Progress Notes (Signed)
FBS 96

## 2019-05-18 NOTE — Progress Notes (Signed)
Pt with some increased pain during the night, pt tearful at times. Pain control with oral pain medication. Surgical dressing dry and intact. Iv infusing without difficulty. Voiding without difficulty. Pt able to sleep in between care.

## 2019-05-18 NOTE — Progress Notes (Signed)
  Subjective: 2 Days Post-Op Procedure(s) (LRB): OPEN REDUCTION INTERNAL FIXATION (ORIF) TIBIAL PLATEAU (Right) Patient reports pain as moderate to severe.  Crying this morning.  Pain in both hips is well with lying flat. Patient seen in rounds with Dr. Rudene Christians. Patient is well, and has had no acute complaints or problems Plan is to go Home versus rehab, depending on care management after hospital stay. Negative for chest pain and shortness of breath Fever: no Gastrointestinal: Negative for nausea and vomiting  Objective: Vital signs in last 24 hours: Temp:  [97.8 F (36.6 C)-98.9 F (37.2 C)] 98.3 F (36.8 C) (07/18 2310) Pulse Rate:  [73-74] 73 (07/18 2310) Resp:  [16-18] 16 (07/18 2310) BP: (123-141)/(51-62) 123/62 (07/18 2310) SpO2:  [95 %-100 %] 100 % (07/18 2310)  Intake/Output from previous day:  Intake/Output Summary (Last 24 hours) at 05/18/2019 0636 Last data filed at 05/18/2019 0500 Gross per 24 hour  Intake 2809.98 ml  Output 200 ml  Net 2609.98 ml    Intake/Output this shift: Total I/O In: -  Out: 200 [Urine:200]  Labs: Recent Labs    05/16/19 0400 05/17/19 0848  HGB 10.2* 9.3*   Recent Labs    05/16/19 0400 05/17/19 0848  WBC 6.0 5.8  RBC 3.55* 3.18*  HCT 33.3* 30.0*  PLT 100* 87*   Recent Labs    05/16/19 0400 05/17/19 0848  NA 138 138  K 4.8 4.7  CL 108 108  CO2 24 23  BUN 33* 25*  CREATININE 1.44* 1.06*  GLUCOSE 217* 274*  CALCIUM 8.1* 8.0*   No results for input(s): LABPT, INR in the last 72 hours.   EXAM General - Patient is Alert and Oriented Extremity - Neurovascular intact Sensation intact distally Compartment soft Dressing/Incision - clean, dry, no drainage, with hinged brace locked in extension. Motor Function - intact, moving foot and toes well on exam.   Past Medical History:  Diagnosis Date  . Coronary artery disease   . Depression   . Diabetes mellitus without complication ([place])   . GERD (gastroesophageal reflux  disease)   . Hypertension     Assessment/Plan: 2 Days Post-Op Procedure(s) (LRB): OPEN REDUCTION INTERNAL FIXATION (ORIF) TIBIAL PLATEAU (Right) Active Problems:   Tibial fracture  Estimated body mass index is 41.2 kg/m as calculated from the following:   Height as of this encounter: 5\' 9"  (1.753 m).   Weight as of this encounter: 126.6 kg. Advance diet Up with therapy D/C IV fluids  Discharge planning to rehab.  Care management to begin searching tomorrow.  DVT Prophylaxis - Lovenox Non-weight-Bearing to right leg  Reche Dixon, PA-C Orthopaedic Surgery 05/18/2019, 6:36 AM

## 2019-05-18 NOTE — Progress Notes (Signed)
Physical Therapy Treatment Patient Details Name: [name] NeatSusan Munoz MRN: [id number] DOB: [birth year] Today's Date: 05/18/2019    History of Present Illness Pt presented to ED for a fall, s/p open reduction internal fixation of tibial plateau (R).  PMH of HTN, CAD, depression, GERD, DM, CKD, obesity. Pt also with Nondisplaced right lateral malleolar fracture    PT Comments    Pt in bed asleep all morning but awoke for session prior to lunch.  Participated in exercises as described below. C/o general increase in soreness today but assisted as she was able.  She agreed to EOB.  Increased assist needed - mod a to edge.  Once sitting generally steady unsupported.  She is unable to stand.  Lateral scoot x 2 to left with mod assist from writer by pulling on bed pad.  She put in good effort but generally remains limited.  Returned to bed with mod/max  A  X 1.   Follow Up Recommendations  SNF     Equipment Recommendations  Rolling walker with 5" wheels;3in1 (PT);Wheelchair (measurements PT);Wheelchair cushion (measurements PT)    Recommendations for Other Services       Precautions / Restrictions Precautions Precautions: Fall Required Braces or Orthoses: Other Brace;Knee Immobilizer - Right Knee Immobilizer - Right: On at all times;Other (comment) Restrictions Weight Bearing Restrictions: Yes RLE Weight Bearing: Non weight bearing    Mobility  Bed Mobility Overal bed mobility: Needs Assistance Bed Mobility: Supine to Sit;Sit to Supine     Supine to sit: Mod assist Sit to supine: Max assist;Mod assist   General bed mobility comments: increased assist today due to pain and stiffness  Transfers Overall transfer level: Needs assistance   Transfers: Sit to/from Stand Sit to Stand: +2 physical assistance;From elevated surface;Total assist        Lateral/Scoot Transfers: Mod assist;Max assist General transfer comment: unable to stand.  practiced lateral scoot transfer to left with  mod/max a  x 1 with writer pulling on pad to assist.  Ambulation/Gait             General Gait Details: deferred   Stairs             Wheelchair Mobility    Modified Rankin (Stroke Patients Only)       Balance Overall balance assessment: Needs assistance Sitting-balance support: Feet supported;Bilateral upper extremity supported Sitting balance-Leahy Scale: Fair       Standing balance-Leahy Scale: Zero                              Cognition Arousal/Alertness: Awake/alert Behavior During Therapy: WFL for tasks assessed/performed Overall Cognitive Status: Within Functional Limits for tasks assessed                                        Exercises General Exercises - Lower Extremity Ankle Circles/Pumps: AROM;Left;10 reps Quad Sets: AROM;Both;10 reps Gluteal Sets: AROM;Both;5 reps Heel Slides: AROM;Left;10 reps Hip ABduction/ADduction: AROM;Both;10 reps Straight Leg Raises: AROM;10 reps;Both    General Comments        Pertinent Vitals/Pain Pain Assessment: Faces Faces Pain Scale: Hurts whole lot Pain Location: with mobility and ROM EX as allowed. Pain Descriptors / Indicators: Sore;Aching Pain Intervention(s): Limited activity within patient's tolerance;Monitored during session;Repositioned    Home Living  Prior Function            PT Goals (current goals can now be found in the care plan section) Progress towards PT goals: Progressing toward goals    Frequency    BID      PT Plan Current plan remains appropriate    Co-evaluation              AM-PAC PT "6 Clicks" Mobility   Outcome Measure  Help needed turning from your back to your side while in a flat bed without using bedrails?: A Lot Help needed moving from lying on your back to sitting on the side of a flat bed without using bedrails?: A Lot Help needed moving to and from a bed to a chair (including a wheelchair)?:  Total Help needed standing up from a chair using your arms (e.g., wheelchair or bedside chair)?: Total Help needed to walk in hospital room?: Total Help needed climbing 3-5 steps with a railing? : Total 6 Click Score: 8    End of Session Equipment Utilized During Treatment: Gait belt Activity Tolerance: Patient limited by fatigue Patient left: in bed;with call bell/phone within reach;with bed alarm set;with SCD's reapplied   Pain - Right/Left: Right Pain - part of body: Knee     Time: 9169-4503 PT Time Calculation (min) (ACUTE ONLY): 18 min  Charges:  $Therapeutic Exercise: 8-22 mins                     Chesley Noon, PTA 05/18/19, 1:21 PM

## 2019-05-18 NOTE — Progress Notes (Signed)
[place] [facility] [place] NAME: [name] Munoz    MR#:  [id number]  DATE OF BIRTH:  1946/06/02  SUBJECTIVE:   Chief Complaint  Patient presents with  . Fall   Patient is seen at the bedside. She is very sleepy, state that she did not sleep well last night due to pain. Denies pain now.  REVIEW OF SYSTEMS:  Review of Systems  Constitutional: Negative for chills, fever, malaise/fatigue and weight loss.  HENT: Negative for congestion, hearing loss and sore throat.   Eyes: Negative for blurred vision and double vision.  Respiratory: Negative for cough, shortness of breath and wheezing.   Cardiovascular: Negative for chest pain, palpitations, orthopnea and leg swelling.  Gastrointestinal: Negative for abdominal pain, diarrhea, nausea and vomiting.  Genitourinary: Negative for dysuria and urgency.  Musculoskeletal: Positive for joint pain. Negative for myalgias.  Skin: Negative for rash.  Neurological: Negative for dizziness, sensory change, speech change, focal weakness and headaches.  Psychiatric/Behavioral: Positive for depression. The patient is nervous/anxious.    DRUG ALLERGIES:   Allergies  Allergen Reactions  . Erythromycin   . Lamisil [Terbinafine Hcl]   . Tramadol Hcl   . Penicillins    VITALS:  Blood pressure (!) 144/76, pulse 79, temperature 99 F (37.2 C), temperature source Oral, resp. rate 17, height 5\' 9"  (1.753 m), weight 126.6 kg, SpO2 97 %. PHYSICAL EXAMINATION:   GENERAL:  73 y.o.-year-old patient lying in the bed with no acute distress.  EYES: Pupils equal, round, reactive to light and accommodation. No scleral icterus. Extraocular muscles intact.  HEENT: Head atraumatic, normocephalic. Oropharynx and nasopharynx clear.  NECK:  Supple, no jugular venous distention. No thyroid enlargement, no tenderness.  LUNGS: Normal breath sounds bilaterally, no wheezing, rales,rhonchi or crepitation. No use of accessory muscles of  respiration.  CARDIOVASCULAR: S1, S2 normal. No murmurs, rubs, or gallops.  ABDOMEN: Soft, nontender, nondistended. Bowel sounds present. No organomegaly or mass.  EXTREMITIES: No pedal edema, cyanosis, or clubbing. No rash or lesions. + pedal pulses MUSCULOSKELETAL: Normal bulk, and power was 5+ grip and elbow, knee, and ankle flexion and extension on the LLE. Immobilizer to right leg  NEUROLOGIC:Alert and oriented x 3. CN 2-12 intact. Sensation to light touch and cold stimuli intact bilaterally. Gait not tested due to safety concern. PSYCHIATRIC: The patient is alert and oriented x 3.  SKIN: No obvious rash, lesion, or ulcer.   DATA REVIEWED:  LABORATORY PANEL:  Female CBC Recent Labs  Lab 05/18/19 0721  WBC 7.3  HGB 9.3*  HCT 30.0*  PLT 117*   ------------------------------------------------------------------------------------------------------------------ Chemistries  Recent Labs  Lab 05/15/19 0439  05/18/19 0721  NA 139   < > 137  K 4.2   < > 4.0  CL 106   < > 108  CO2 23   < > 22  GLUCOSE 375*   < > 173*  BUN 45*   < > 27*  CREATININE 1.72*   < > 1.20*  CALCIUM 8.5*   < > 8.5*  AST 16  --   --   ALT 12  --   --   ALKPHOS 115  --   --   BILITOT 1.2  --   --    < > = values in this interval not displayed.   RADIOLOGY:  Dg Tibia/fibula Right  Result Date: 05/16/2019 CLINICAL DATA:  ORIF of a comminuted LATERAL tibial plateau fracture involving the RIGHT knee. EXAM: OPERATIVE RIGHT  KNEE-2 VIEW COMPARISON:  RIGHT knee x-rays and CT yesterday. FINDINGS: Nine spot images from the C-arm fluoroscopic device, AP and LATERAL views of the RIGHT knee, were obtained at various points during the ORIF procedure and are submitted for interpretation postoperatively. These images demonstrate plate and screw fixation of the comminuted LATERAL tibial plateau fracture. The radiologic technologist documented 2 minutes and 17 seconds of fluoroscopy time with a patient dose of 3.8 mGy.  IMPRESSION: ORIF of the comminuted LATERAL tibial plateau fracture. Electronically Signed   By: Hulan Saashomas  Lawrence M.D.   On: 05/16/2019 20:59   Dg C-arm 1-60 Min-no Report  Result Date: 05/16/2019 Fluoroscopy was utilized by the requesting physician.  No radiographic interpretation.   ASSESSMENT AND PLAN:   73 y.o. female with past medical hx of DM, CAD, GERD, hypertension and depression presenting with severe pain in her right knee and ankle following a mechanical fall. She was found to have a right lateral tibial fracture.   1. Right tibial fracture - s/p Open reduction internal fixation of the right lateral tibial plateau fracture POD # 2 - Pain management PRN - Non-weight bearing to right leg, Immobilizer in place - Up with PT, recommends SNF - Ortho input appreciated  2. Coronary Artery Disease  - ASA 81mg  PO daily - Clopidogrel 75mg  PO daily  - HTN, HLD, DM control as below  3. HLD + Goal LDL<100 - Atorvastatin 40mg  PO qhs  4. HTN  + Goal BP <140/90 - Beta-blocker: Metoprolol - ACE-Inhibitor: Lisinopril  5.  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 DVT prophylaxis - Enoxaparin  SubQ due to poor mobility and stay >24hrs  7. Depression - Wellbutrin and Celexa  All the records are reviewed and case discussed with Care Management/Social Worker. Management plans discussed with the patient, family and they are in agreement.  CODE STATUS: Full Code  TOTAL TIME TAKING CARE OF THIS PATIENT: 36 minutes.   More than 50% of the time was spent in counseling/coordination of care: YES  POSSIBLE D/C IN 1 DAYS, DEPENDING ON CLINICAL CONDITION.   on 05/18/2019 at 11:31 AM  Webb SilversmithElizabeth Berlynn Warsame, DNP, FNP-BC [facility] Nurse Practitioner Between 7am to 6pm - Pager - [phone]  After 6pm go to [email or web address] - password [email or web address] [place]  [facility]  Office  [phone]  CC: Primary care physician; Delton PrairieBrooks, Keatah B, FNP  Note: This dictation was prepared with Dragon dictation along with smaller phrase technology. Any transcriptional errors that result from this process are unintentional.

## 2019-05-19 ENCOUNTER — Encounter: Payer: Self-pay | Admitting: Orthopedic Surgery

## 2019-05-19 LAB — GLUCOSE, CAPILLARY
Glucose-Capillary: 96 mg/dL (ref 70–99)
Glucose-Capillary: 172 mg/dL — ABNORMAL HIGH (ref 70–99)
Glucose-Capillary: 205 mg/dL — ABNORMAL HIGH (ref 70–99)

## 2019-05-19 LAB — SARS CORONAVIRUS 2 BY RT PCR (HOSPITAL ORDER, PERFORMED IN ~~LOC~~ HOSPITAL LAB): SARS Coronavirus 2: NEGATIVE

## 2019-05-19 MED ORDER — PIOGLITAZONE HCL 45 MG PO TABS: 45.0000 mg | ORAL_TABLET | Freq: Every day | ORAL | 0 refills | Status: AC

## 2019-05-19 MED ORDER — ENOXAPARIN SODIUM 40 MG/0.4ML ~~LOC~~ SOLN: 40.0000 mg | SUBCUTANEOUS | 0 refills | Status: DC

## 2019-05-19 NOTE — Progress Notes (Addendum)
   Subjective: 3 Days Post-Op Procedure(s) (LRB): OPEN REDUCTION INTERNAL FIXATION (ORIF) TIBIAL PLATEAU (Right) Patient reports pain as Moderate to severe..   Patient is well, and has had no acute complaints or problems We will start therapy today.  Plan is to go Rehab after hospital stay. no nausea and no vomiting Patient denies any chest pains or shortness of breath. No real change in patient's status for pain.  States that she had a another rough night.  Secondary to pain.  Objective: Vital signs in last 24 hours: Temp:  [98.6 F (37 C)-99.9 F (37.7 C)] 99.9 F (37.7 C) (07/19 2338) Pulse Rate:  [79-86] 81 (07/19 2338) Resp:  [17-19] 19 (07/19 2338) BP: (138-144)/(50-76) 139/50 (07/19 2338) SpO2:  [94 %-97 %] 97 % (07/19 2338) Heels are non tender and elevated off the bed using rolled towels  Intake/Output from previous day: 07/19 0701 - 07/20 0700 In: 240 [P.O.:240] Out: 1200 [Urine:1200] Intake/Output this shift: Total I/O In: -  Out: 700 [Urine:700]  Recent Labs    05/17/19 0848 05/18/19 0721  HGB 9.3* 9.3*   Recent Labs    05/17/19 0848 05/18/19 0721  WBC 5.8 7.3  RBC 3.18* 3.19*  HCT 30.0* 30.0*  PLT 87* 117*   Recent Labs    05/17/19 0848 05/18/19 0721  NA 138 137  K 4.7 4.0  CL 108 108  CO2 23 22  BUN 25* 27*  CREATININE 1.06* 1.20*  GLUCOSE 274* 173*  CALCIUM 8.0* 8.5*   No results for input(s): LABPT, INR in the last 72 hours.  EXAM General - Patient is Alert, Appropriate and Oriented Extremity - Neurologically intact Neurovascular intact Sensation intact distally Intact pulses distally Dorsiflexion/Plantar flexion intact Compartment soft Dressing - dressing C/D/I Motor Function - intact, moving foot and toes well on exam.    Past Medical History:  Diagnosis Date  . Coronary artery disease   . Depression   . Diabetes mellitus without complication ([place])   . GERD (gastroesophageal reflux disease)   . Hypertension      Assessment/Plan: 3 Days Post-Op Procedure(s) (LRB): OPEN REDUCTION INTERNAL FIXATION (ORIF) TIBIAL PLATEAU (Right) Active Problems:   Tibial fracture  Estimated body mass index is 41.2 kg/m as calculated from the following:   Height as of this encounter: 5\' 9"  (1.753 m).   Weight as of this encounter: 126.6 kg. Up with therapy Discharge to SNF  Labs: None DVT Prophylaxis - Lovenox, Foot Pumps and TED hose Nonweightbearing right lower extremity Recommend bowel movement prior to being discharged Patient will need to follow-up in clinical clinic orthopedics in 1 week Continue Lovenox 40 mg subcu daily for 2 weeks Continue TED stockings bilaterally knee-high.  May be removed 1 hour per 8-hour shift Continue PT.  Physical therapist can unlock range of motion brace and flex to 90 degrees as patient tolerates.  Knee brace needs to be locked in full extension when upright walking   R. [place] 05/19/2019, 6:44 AM

## 2019-05-19 NOTE — TOC Progression Note (Signed)
Transition of Care [facility]) - Progression Note    Patient Details  Name: [name] Munoz MRN: [id number] Date of Birth: 03-20-1946  Transition of Care [facility] CM/SW Contact  Jaisean Monteforte, Lenice Llamas Phone Number: [phone]  05/19/2019, 8:36 AM  Clinical Narrative: Plan is for patient to D/C to [facility] when medically stable. [facility] is still pending.      Expected Discharge Plan: [place] Barriers to Discharge: Continued Medical Work up  Expected Discharge Plan and Services Expected Discharge Plan: [place] In-house Referral: Clinical Social Work Discharge Planning Services: CM Consult Post Acute Care Choice: [facility] [place] Living arrangements for the past 2 months: Apartment                                       Social Determinants of Health (SDOH) Interventions    Readmission Risk Interventions No flowsheet data found.

## 2019-05-19 NOTE — Progress Notes (Signed)
Physical Therapy Treatment Patient Details Name: [name] NeatSusan Munoz MRN: [id number] DOB: [birth year] Today's Date: 05/19/2019    History of Present Illness Pt presented to ED for a fall, s/p open reduction internal fixation of tibial plateau (R).  PMH of HTN, CAD, depression, GERD, DM, CKD, obesity. Pt also with Nondisplaced right lateral malleolar fracture    PT Comments    Pt in bed, ready for session.  Chart reviewed.  Per Donnie CoffinJoh Wolfe note dated 7/20 -  Physical therapist can unlock range of motion brace and flex to 90 degrees as patient tolerates.  Knee brace needs to be locked in full extension when upright walking.  Participated in exercises as described below.  Brace was unlocked in supine for gentle AAROM heal slides.  Some increased pain but overall tolerated well.  To edge of bed with mod a x 1.  She was able to remain sitting x 5 minutes before fatigue.  Lateral scoot to left remains quite challenging for pt and requires max a x 1 pulling on underpad.  Returned to supine with max a x 1.  She is able to scoot up in bed without assist using her arms.  Increased pain after session and RN to give pain medications.   Pt remains significantly limited with mobility and SNF remains appropriate.   Follow Up Recommendations  SNF     Equipment Recommendations  Rolling walker with 5" wheels;3in1 (PT);Wheelchair (measurements PT);Wheelchair cushion (measurements PT)    Recommendations for Other Services       Precautions / Restrictions Precautions Precautions: Fall;Knee Precaution Comments: Physical therapist can unlock range of motion brace and flex to 90 degrees as patient tolerates.  Knee brace needs to be locked in full extension when upright walking Required Braces or Orthoses: Knee Immobilizer - Right Knee Immobilizer - Right: On at all times Restrictions Weight Bearing Restrictions: Yes RLE Weight Bearing: Non weight bearing    Mobility  Bed Mobility Overal bed mobility: Needs  Assistance Bed Mobility: Supine to Sit;Sit to Supine     Supine to sit: Mod assist Sit to supine: Max assist      Transfers Overall transfer level: Needs assistance   Transfers: Lateral/Scoot Transfers          Lateral/Scoot Transfers: Max assist;Mod assist General transfer comment: unable to stand.  practiced lateral scoot transfer to left with mod/max a  x 1 with writer pulling on pad to assist.  Ambulation/Gait                 Stairs             Wheelchair Mobility    Modified Rankin (Stroke Patients Only)       Balance Overall balance assessment: Needs assistance Sitting-balance support: Feet supported;Bilateral upper extremity supported Sitting balance-Leahy Scale: Fair     Standing balance support: Bilateral upper extremity supported Standing balance-Leahy Scale: Zero                              Cognition Arousal/Alertness: Awake/alert Behavior During Therapy: WFL for tasks assessed/performed Overall Cognitive Status: Within Functional Limits for tasks assessed                                        Exercises General Exercises - Lower Extremity Ankle Circles/Pumps: AROM;Left;10 reps Quad Sets: AROM;Both;10 reps Gluteal Sets: AROM;Both;5 reps  Heel Slides: AROM;Left;10 reps;AAROM;Right Hip ABduction/ADduction: AROM;Both;10 reps Straight Leg Raises: AROM;10 reps;Both Other Exercises Other Exercises: sat EOB x 5 minutes with supervision    General Comments        Pertinent Vitals/Pain Pain Assessment: Faces Faces Pain Scale: Hurts whole lot Pain Location: with return to supine from sitting Pain Descriptors / Indicators: Aching;Operative site guarding;Sore Pain Intervention(s): Patient requesting pain meds-RN notified;Monitored during session;Limited activity within patient's tolerance;Ice applied    Home Living                      Prior Function            PT Goals (current goals can  now be found in the care plan section)      Frequency    BID      PT Plan Current plan remains appropriate    Co-evaluation              AM-PAC PT "6 Clicks" Mobility   Outcome Measure  Help needed turning from your back to your side while in a flat bed without using bedrails?: A Lot Help needed moving from lying on your back to sitting on the side of a flat bed without using bedrails?: A Lot Help needed moving to and from a bed to a chair (including a wheelchair)?: Total Help needed standing up from a chair using your arms (e.g., wheelchair or bedside chair)?: Total Help needed to walk in hospital room?: Total Help needed climbing 3-5 steps with a railing? : Total 6 Click Score: 8    End of Session   Activity Tolerance: Patient tolerated treatment well;Patient limited by fatigue;Patient limited by pain Patient left: in bed;with call bell/phone within reach;with bed alarm set;with SCD's reapplied Nurse Communication: Patient requests pain meds Pain - Right/Left: Right Pain - part of body: Knee     Time:  -     Charges:                       Chesley Noon, PTA 05/19/19, 9:55 AM

## 2019-05-19 NOTE — Progress Notes (Signed)
Pt in no acute distress. VSS. IV removed. Report called to [facility] RN. EMS called to transport the pt.

## 2019-05-19 NOTE — Progress Notes (Signed)
Occupational Therapy Treatment Patient Details Name: [name] NeatSusan Munoz MRN: [id number] DOB: 1946-05-21 Today's Date: 05/19/2019    History of present illness Pt presented to ED for a fall, s/p open reduction internal fixation of tibial plateau (R).  PMH of HTN, CAD, depression, GERD, DM, CKD, obesity. Pt also with Nondisplaced right lateral malleolar fracture   OT comments  Pt seen for OT treatment on this date. Upon arrival to room pt awake/alert, semi-supine in bed using bedpan. Pt tearful and requesting to speak to her daughter. Pt spoke with daughter on personal cell phone briefly and expressed concerns that she had not been able to have a bowel movement. RN re-assured pt that she has had a BM and would be able to DC to SNF on this date. OT utilized therapeutic use of self and active listening to provide encouragement and build therapeutic rapport. Pt assisted with bedding change as sheets had been soiled during her attempts on bedpan. OT and RN provided +2 max assist for rolling on this date. Pt limited by pain in her RLE, but demonstrated good attempts at use of BUE during bed mobility. After bedding change, OT provided additional education on falls prevention and polar care mgt on this date. Pt verbalized understanding of instruction provided. Pt making good progress toward goals. Pt continues to benefit from skilled OT services to maximize return to PLOF and minimize risk of future falls, injury, caregiver burden, and readmission. Will continue to follow POC. Discharge recommendation remains appropriate.    Follow Up Recommendations  SNF    Equipment Recommendations  Other (comment)(TBD)    Recommendations for Other Services      Precautions / Restrictions Precautions Precautions: Fall;Knee Precaution Comments: Physical therapist can unlock range of motion brace and flex to 90 degrees as patient tolerates.  Knee brace needs to be locked in full extension when upright walking Required Braces  or Orthoses: Knee Immobilizer - Right Knee Immobilizer - Right: On at all times Restrictions Weight Bearing Restrictions: Yes RLE Weight Bearing: Non weight bearing       Mobility Bed Mobility Overal bed mobility: Needs Assistance Bed Mobility: Rolling Rolling: +2 for physical assistance;Max assist         General bed mobility comments: Pt required +2 assist for rolling in bed for bedding change on this date. Pt demonstrated good overall attempts and use of UE throughout, however, still required +2 max assist to maintain position while OT and RN completed bedding change and peri care after she used the bedpan.  Transfers Overall transfer level: Needs assistance Equipment used: Rolling walker (2 wheeled)   Sit to Stand: +2 physical assistance;From elevated surface;Total assist         General transfer comment: Deferred. See PT note.    Balance Overall balance assessment: Needs assistance Sitting-balance support: Feet supported;Bilateral upper extremity supported Sitting balance-Leahy Scale: Fair     Standing balance support: Bilateral upper extremity supported Standing balance-Leahy Scale: Zero                             ADL either performed or assessed with clinical judgement   ADL Overall ADL's : Needs assistance/impaired Eating/Feeding: Set up   Grooming: Set up Grooming Details (indicate cue type and reason): unable to stand to perform at the sink Upper Body Bathing: Set up;Bed level;Minimal assistance   Lower Body Bathing: Maximal assistance;Bed level   Upper Body Dressing : Set up;Bed level   Lower Body  Dressing: Maximal assistance;+2 for physical assistance   Toilet Transfer: +2 for physical assistance;Maximal assistance                   Vision Baseline Vision/History: Wears glasses Wears Glasses: At all times Patient Visual Report: No change from baseline     Perception     Praxis      Cognition Arousal/Alertness:  Awake/alert Behavior During Therapy: WFL for tasks assessed/performed Overall Cognitive Status: Within Functional Limits for tasks assessed                                 General Comments: Pt tearful at times, states "I have to get her here to see me" when referring to her daugther. Pt spoke with dtr on phone during session which seemed to improve her overall disposition.        Exercises Other Exercises Other Exercises: Pt assisted with bedding change and peri-care after using the bedpan prior to session. Other Exercises: Pt provided with reinforcement of education on polar care and falls prevention strategies. Return verbalized understanding of information provided but would benefit from continued reinforcement.   Shoulder Instructions       General Comments      Pertinent Vitals/ Pain       Faces Pain Scale: Hurts even more Pain Location: RLE Pain Descriptors / Indicators: Aching;Operative site guarding;Sore;Grimacing Pain Intervention(s): Limited activity within patient's tolerance;Monitored during session;Repositioned  Home Living                                          Prior Functioning/Environment              Frequency  Min 1X/week        Progress Toward Goals  OT Goals(current goals can now be found in the care plan section)  Progress towards OT goals: Progressing toward goals  Acute Rehab OT Goals Patient Stated Goal: Patient would like to be as independent as she was before OT Goal Formulation: With patient Time For Goal Achievement: 05/31/19 Potential to Achieve Goals: Good  Plan Discharge plan remains appropriate;Frequency remains appropriate    Co-evaluation                 AM-PAC OT "6 Clicks" Daily Activity     Outcome Measure   Help from another person eating meals?: None Help from another person taking care of personal grooming?: A Little Help from another person toileting, which includes using  toliet, bedpan, or urinal?: Total Help from another person bathing (including washing, rinsing, drying)?: A Lot Help from another person to put on and taking off regular upper body clothing?: A Little Help from another person to put on and taking off regular lower body clothing?: Total 6 Click Score: 14    End of Session Equipment Utilized During Treatment: Right knee immobilizer(locked in extension.)  OT Visit Diagnosis: Muscle weakness (generalized) (M62.81);Unsteadiness on feet (R26.81);Pain Pain - Right/Left: Right Pain - part of body: Leg   Activity Tolerance Patient tolerated treatment well   Patient Left in bed;with call bell/phone within reach;with bed alarm set(With KI in place; with polar care in place.)   Nurse Communication Other (comment)(Pt needs external cath replaced.)        Time: 1425-1448 OT Time Calculation (min): 23 min  Charges: OT General Charges $  OT Visit: 1 Visit OT Treatments $Self Care/Home Management : 23-37 mins  Shara Blazing, M.S., OTR/L Ascom: [phone] 05/19/19, 3:33 PM

## 2019-05-19 NOTE — TOC Progression Note (Signed)
Transition of Care [facility]) - Progression Note    Patient Details  Name: [name] Munoz MRN: [id number] Date of Birth: November 14, 1945  Transition of Care [facility]) CM/SW Contact  Celeste Tavenner, Lenice Llamas Phone Number: [phone]  05/19/2019, 11:49 AM  Clinical Narrative: Gwendalyn Ege has been received [phone] F expires 06/18/19. [facility] case manager Donata Clay is aware of above and that plan is for patient to D/C to [facility]. [place] admissions coordinator at [employer] is aware of above.       Expected Discharge Plan: [place] Barriers to Discharge: Continued Medical Work up  Expected Discharge Plan and Services Expected Discharge Plan: [place] In-house Referral: Clinical Social Work Discharge Planning Services: CM Consult Post Acute Care Choice: [place] Living arrangements for the past 2 months: Apartment                                       Social Determinants of Health (SDOH) Interventions    Readmission Risk Interventions No flowsheet data found.

## 2019-05-19 NOTE — TOC Transition Note (Signed)
Transition of Care [facility]) - CM/SW Discharge Note   Patient Details  Name: [name] Munoz MRN: [id number] Date of Birth: 1946-05-03  Transition of Care [facility]) CM/SW Contact:  Margarett Viti, Lenice Llamas Phone Number: [phone]  05/19/2019, 2:11 PM   Clinical Narrative: Patient is medically stable for D/C to [facility] today. Per [facility] admissions coordinator at [facility] patient can come today to room E-15. RN will call report and arrange EMS for transport. Clinical Education officer, museum (CSW) sent D/C orders to [facility] via [facility]. Covid test is negative today 7/20. [facility] case manager Donata Clay is aware of above. Patient is aware of above. CSW contacted patient's daughter Levada Dy and made her aware of above. Please reconsult if future social work needs arise. CSW signing off.       Final next level of care: Skilled Nursing Facility Barriers to Discharge: Barriers Resolved   Patient Goals and CMS Choice Patient states their goals for this hospitalization and ongoing recovery are:: Pain control   Choice offered to / list presented to : Patient  Discharge Placement PASRR number recieved: 05/19/19            Patient chooses bed at: [facility]) Patient to be transferred to facility by: [facility] Name of family member notified: Patient's daughter Levada Dy is aware of D/C today. Patient and family notified of of transfer: 05/19/19  Discharge Plan and Services In-house Referral: Clinical Social Work Discharge Planning Services: CM Consult Post Acute Care Choice: [place]                               Social Determinants of Health (SDOH) Interventions     Readmission Risk Interventions No flowsheet data found.

## 2019-05-19 NOTE — Discharge Summary (Signed)
[facility] [place] [facility]   PATIENT NAME: [name] Munoz    MR#:  [id number]  DATE OF BIRTH:  December 12, 1945  DATE OF ADMISSION:  05/15/2019   ADMITTING PHYSICIAN: Delfino LovettVipul Shah, MD  DATE OF DISCHARGE:05/19/19  PRIMARY CARE PHYSICIAN: Delton PrairieBrooks, Keatah B, FNP   ADMISSION DIAGNOSIS:   Fracture [T14.8XXA] Closed fracture of lateral portion of right tibial plateau, initial encounter [S82.121A] Nondisplaced fracture of lateral malleolus of right fibula, initial encounter for closed fracture [S82.64XA] Chronic kidney disease, unspecified CKD stage [N18.9]  DISCHARGE DIAGNOSIS:   Active Problems:   Tibial fracture  SECONDARY DIAGNOSIS:   Past Medical History:  Diagnosis Date  . Coronary artery disease   . Depression   . Diabetes mellitus without complication (HCC)   . GERD (gastroesophageal reflux disease)   . Hypertension    HOSPITAL COURSE:  73 y.o. female with past medical hx of DM, CAD, GERD, hypertension and depression presenting with severe pain in her right knee and ankle following a mechanical fall. She was found to have a right lateral tibial fracture.  1. Right tibial fracture - s/p Open reduction internal fixation of the right lateral tibial plateau fracture POD # 3 - Patient was followed by ortho during this hospitalization - PT worked with patient and will continued PT at discharge - Physical therapist can unlock range of motion brace and flex to 90 degrees as patient tolerates.  Knee brace needs to be locked in full extension when upright walking - Follow up with Ortho in 1 week as scheduled - Discharge to [facility]  2. Coronary Artery Disease  - ASA 81mg  PO daily - Clopidogrel 75mg  PO daily  - HTN, HLD, DM control as below  3. HLD + Goal LDL<100 - Atorvastatin 40mg  PO qhs  4. HTN  + Goal BP <140/90 - Beta-blocker: Metoprolol - ACE-Inhibitor: Lisinopril  5.  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 DVT prophylaxis - Will continue  Enoxaparin  SubQ for additional 9 days (total 14 days)  7. Depression - Wellbutrin and Celexa   DISCHARGE CONDITIONS:   stable CONSULTS OBTAINED:   Treatment Team:  Donato HeinzHooten, James P, MD  DRUG ALLERGIES:   Allergies  Allergen Reactions  . Erythromycin   . Lamisil [Terbinafine Hcl]   . Tramadol Hcl   . Penicillins    DISCHARGE MEDICATIONS:   Allergies as of 05/19/2019      Reactions   Erythromycin    Lamisil [terbinafine Hcl]    Tramadol Hcl    Penicillins       Medication List    STOP taking these medications   sitaGLIPtin 50 MG tablet Commonly known as: JANUVIA     TAKE these medications   acetaminophen 325 MG tablet Commonly known as: TYLENOL Take 650 mg by mouth every 6 (six) hours as needed for mild pain.   aspirin EC 81 MG tablet Take 81 mg by mouth daily.   atorvastatin 40 MG tablet Commonly known as: LIPITOR Take 40 mg by mouth daily.   buPROPion 150 MG 24 hr tablet Commonly known as: WELLBUTRIN XL Take 150 mg by mouth daily.   Bydureon 2 MG Pen Generic drug: Exenatide ER Inject 2 mg into the skin once a week. On Mondays   citalopram 40 MG tablet Commonly known as: CELEXA Take 40 mg by mouth daily.   enoxaparin 40 MG/0.4ML injection Commonly known as: LOVENOX Inject 0.4 mLs (40 mg total) into  the skin daily for 9 days.   fluticasone 50 MCG/ACT nasal spray Commonly known as: FLONASE Place 2 sprays into both nostrils daily.   insulin detemir 100 UNIT/ML injection Commonly known as: LEVEMIR Inject 10 Units into the skin daily.   lisinopril 10 MG tablet Commonly known as: ZESTRIL Take 10 mg by mouth daily.   metoprolol succinate 25 MG 24 hr tablet Commonly known as: TOPROL-XL Take 25 mg by mouth daily.   ondansetron 4 MG tablet Commonly known as: ZOFRAN Take 1 tablet by mouth every 6 (six) hours as needed.   pantoprazole 40 MG tablet Commonly known as: PROTONIX Take 40 mg by mouth 2 (two)  times daily.   pioglitazone 45 MG tablet Commonly known as: ACTOS Take 1 tablet (45 mg total) by mouth daily. Start taking on: May 20, 2019 What changed: medication strength   polyethylene glycol 17 g packet Commonly known as: MIRALAX / GLYCOLAX Take 17 g by mouth daily as needed for mild constipation.   pregabalin 75 MG capsule Commonly known as: LYRICA Take 75 mg by mouth 2 (two) times daily.   ranitidine 150 MG tablet Commonly known as: ZANTAC Take 150 mg by mouth 2 (two) times daily as needed.   vitamin B-12 1000 MCG tablet Commonly known as: CYANOCOBALAMIN Take 1,000 mcg by mouth every 30 (thirty) days.      DISCHARGE INSTRUCTIONS:    DIET:   Diabetic diet  ACTIVITY:   See ortho instructions on discharge plan  OXYGEN:   Home Oxygen: No.  Oxygen Delivery: room air  DISCHARGE LOCATION:   home   If you experience worsening of your admission symptoms, develop shortness of breath, life threatening emergency, suicidal or homicidal thoughts you must seek medical attention immediately by calling 911 or calling your MD immediately  if symptoms less severe.  You Must read complete instructions/literature along with all the possible adverse reactions/side effects for all the Medicines you take and that have been prescribed to you. Take any new Medicines after you have completely understood and accpet all the possible adverse reactions/side effects.   Please note  You were cared for by a hospitalist during your hospital stay. If you have any questions about your discharge medications or the care you received while you were in the hospital after you are discharged, you can call the unit and asked to speak with the hospitalist on call if the hospitalist that took care of you is not available. Once you are discharged, your primary care physician will handle any further medical issues. Please note that NO REFILLS for any discharge medications will be authorized once you are  discharged, as it is imperative that you return to your primary care physician (or establish a relationship with a primary care physician if you do not have one) for your aftercare needs so that they can reassess your need for medications and monitor your lab values.  On the day of Discharge:  VITAL SIGNS:   Blood pressure (!) 166/69, pulse 84, temperature 99.2 F (37.3 C), temperature source Oral, resp. rate 19, height  (1.753 m), weight 126.6 kg, SpO2 99 %.  PHYSICAL EXAMINATION:   GENERAL:  73 y.o.-year-old patient lying in the bed with no acute distress.  EYES: Pupils equal, round, reactive to light and accommodation. No scleral icterus. Extraocular muscles intact.  HEENT: Head atraumatic, normocephalic. Oropharynx and nasopharynx clear.  NECK:  Supple, no jugular venous distention. No thyroid enlargement, no tenderness.  LUNGS: Normal breath sounds bilaterally,  no wheezing, rales,rhonchi or crepitation. No use of accessory muscles of respiration.  CARDIOVASCULAR: S1, S2 normal. No murmurs, rubs, or gallops.  ABDOMEN: Soft, nontender, nondistended. Bowel sounds present. No organomegaly or mass.  EXTREMITIES: No pedal edema, cyanosis, or clubbing. No rash or lesions. + pedal pulses MUSCULOSKELETAL: Normal bulk, and power was 5+ grip and elbow, knee, and ankle flexion and extension on the LLE. Immobilizer to right leg  NEUROLOGIC:Alert and oriented x 3. CN 2-12 intact. Sensation to light touch and cold stimuli intact bilaterally. Gait not tested due to safety concern. PSYCHIATRIC: The patient is alert and oriented x 3.  SKIN: No obvious rash, lesion, or ulcer.   DATA REVIEW:   CBC Recent Labs  Lab 05/18/19 0721  WBC 7.3  HGB 9.3*  HCT 30.0*  PLT 117*    Chemistries  Recent Labs  Lab 05/15/19 0439  05/18/19 0721  NA 139   < > 137  K 4.2   < > 4.0  CL 106   < > 108  CO2 23   < > 22  GLUCOSE 375*   < > 173*  BUN 45*   < > 27*  CREATININE 1.72*   < > 1.20*  CALCIUM  8.5*   < > 8.5*  AST 16  --   --   ALT 12  --   --   ALKPHOS 115  --   --   BILITOT 1.2  --   --    < > = values in this interval not displayed.     Microbiology Results  Results for orders placed or performed during the hospital encounter of 05/15/19  SARS Coronavirus 2 (CEPHEID - Performed in [facility] hospital lab), Hosp Order     Status: None   Collection Time: 05/15/19  4:39 AM   Specimen: Nasopharyngeal Swab  Result Value Ref Range Status   SARS Coronavirus 2 NEGATIVE NEGATIVE Final    Comment: (NOTE) If result is NEGATIVE SARS-CoV-2 target nucleic acids are NOT DETECTED. The SARS-CoV-2 RNA is generally detectable in upper and lower  respiratory specimens during the acute phase of infection. The lowest  concentration of SARS-CoV-2 viral copies this assay can detect is 250  copies / mL. A negative result does not preclude SARS-CoV-2 infection  and should not be used as the sole basis for treatment or other  patient management decisions.  A negative result may occur with  improper specimen collection / handling, submission of specimen other  than nasopharyngeal swab, presence of viral mutation(s) within the  areas targeted by this assay, and inadequate number of viral copies  (<250 copies / mL). A negative result must be combined with clinical  observations, patient history, and epidemiological information. If result is POSITIVE SARS-CoV-2 target nucleic acids are DETECTED. The SARS-CoV-2 RNA is generally detectable in upper and lower  respiratory specimens dur ing the acute phase of infection.  Positive  results are indicative of active infection with SARS-CoV-2.  Clinical  correlation with patient history and other diagnostic information is  necessary to determine patient infection status.  Positive results do  not rule out bacterial infection or co-infection with other viruses. If result is PRESUMPTIVE POSTIVE SARS-CoV-2 nucleic acids MAY BE PRESENT.   A presumptive  positive result was obtained on the submitted specimen  and confirmed on repeat testing.  While 2019 novel coronavirus  (SARS-CoV-2) nucleic acids may be present in the submitted sample  additional confirmatory testing may be necessary for epidemiological  and / or clinical management purposes  to differentiate between  SARS-CoV-2 and other Sarbecovirus currently known to infect humans.  If clinically indicated additional testing with an alternate test  methodology [phone]([id number]) is advised. The SARS-CoV-2 RNA is generally  detectable in upper and lower respiratory sp ecimens during the acute  phase of infection. The expected result is Negative. Fact Sheet for Patients:  BoilerBrush.com.cyhttps://www.fda.gov/media/136312/download Fact Sheet for Healthcare Providers: https://pope.com/https://www.fda.gov/media/136313/download This test is not yet approved or cleared by the [place] FDA and has been authorized for detection and/or diagnosis of SARS-CoV-2 by FDA under an Emergency Use Authorization (EUA).  This EUA will remain in effect (meaning this test can be used) for the duration of the COVID-19 declaration under Section 564(b)(1) of the Act, 21 U.S.C. section 360bbb-3(b)(1), unless the authorization is terminated or revoked sooner. Performed at [facility], [address] [place], [place] [address]   MRSA PCR Screening     Status: None   Collection Time: 05/16/19  8:43 AM   Specimen: Nasal Mucosa; Nasopharyngeal  Result Value Ref Range Status   MRSA by PCR NEGATIVE NEGATIVE Final    Comment:        The GeneXpert MRSA Assay (FDA approved for NASAL specimens only), is one component of a comprehensive MRSA colonization surveillance program. It is not intended to diagnose MRSA infection nor to guide or monitor treatment for MRSA infections. Performed at [facility], [address] [place], [place] [address]   SARS Coronavirus 2 (CEPHEID - Performed in [facility] hospital lab), Hosp  Order     Status: None   Collection Time: 05/19/19  7:20 AM   Specimen: Nasopharyngeal Swab  Result Value Ref Range Status   SARS Coronavirus 2 NEGATIVE NEGATIVE Final    Comment: (NOTE) If result is NEGATIVE SARS-CoV-2 target nucleic acids are NOT DETECTED. The SARS-CoV-2 RNA is generally detectable in upper and lower  respiratory specimens during the acute phase of infection. The lowest  concentration of SARS-CoV-2 viral copies this assay can detect is 250  copies / mL. A negative result does not preclude SARS-CoV-2 infection  and should not be used as the sole basis for treatment or other  patient management decisions.  A negative result may occur with  improper specimen collection / handling, submission of specimen other  than nasopharyngeal swab, presence of viral mutation(s) within the  areas targeted by this assay, and inadequate number of viral copies  (<250 copies / mL). A negative result must be combined with clinical  observations, patient history, and epidemiological information. If result is POSITIVE SARS-CoV-2 target nucleic acids are DETECTED. The SARS-CoV-2 RNA is generally detectable in upper and lower  respiratory specimens dur ing the acute phase of infection.  Positive  results are indicative of active infection with SARS-CoV-2.  Clinical  correlation with patient history and other diagnostic information is  necessary to determine patient infection status.  Positive results do  not rule out bacterial infection or co-infection with other viruses. If result is PRESUMPTIVE POSTIVE SARS-CoV-2 nucleic acids MAY BE PRESENT.   A presumptive positive result was obtained on the submitted specimen  and confirmed on repeat testing.  While 2019 novel coronavirus  (SARS-CoV-2) nucleic acids may be present in the submitted sample  additional confirmatory testing may be necessary for epidemiological  and / or clinical management purposes  to differentiate between  SARS-CoV-2  and other Sarbecovirus currently known to infect humans.  If clinically indicated additional testing with an alternate  test  methodology [phone]) is advised. The SARS-CoV-2 RNA is generally  detectable in upper and lower respiratory sp ecimens during the acute  phase of infection. The expected result is Negative. Fact Sheet for Patients:  StrictlyIdeas.no Fact Sheet for Healthcare Providers: BankingDealers.co.za This test is not yet approved or cleared by the [place] FDA and has been authorized for detection and/or diagnosis of SARS-CoV-2 by FDA under an Emergency Use Authorization (EUA).  This EUA will remain in effect (meaning this test can be used) for the duration of the COVID-19 declaration under Section 564(b)(1) of the Act, 21 U.S.C. section 360bbb-3(b)(1), unless the authorization is terminated or revoked sooner. Performed at [facility], [address] [place], [place] [address]     RADIOLOGY:  No results found.  Management plans discussed with the patient, family and they are in agreement.  CODE STATUS:     Code Status Orders  (From admission, onward)         Start     Ordered   05/15/19 0853  Full code  Continuous     05/15/19 0852        Code Status History    Date Active Date Inactive Code Status Order ID Comments User Context   10/17/2018 1421 10/18/2018 1658 Full Code [id number]  Dustin Flock, MD Inpatient   10/19/2015 2041 10/21/2015 1609 Full Code [id number]  Dustin Flock, MD Inpatient   09/18/2015 0628 09/20/2015 1641 Full Code [id number]  Harrie Foreman, MD Inpatient   Advance Care Planning Activity      TOTAL TIME TAKING CARE OF THIS PATIENT: 40 minutes.   Rufina Falco, DNP, FNP-BC [facility] Nurse Practitioner Between 7am to 6pm - Pager - [phone]  After 6pm go to [email or web address] - password [email or web address] [place] [facility]  Office  [phone]   CC: Primary care physician; Talbert Cage, FNP  Note: This dictation was prepared with Dragon dictation along with smaller phrase technology. Any transcriptional errors that result from this process are unintentional.

## 2019-05-19 NOTE — Progress Notes (Signed)
Pt alert but can have moments of confusion. Slept in between care and was able to get some good rest. Surgical dressing dry and intact, brace in place. Moderate pain this morning. Pt tearful at times. 2 Assist in turning pt.

## 2020-12-19 ENCOUNTER — Emergency Department: Payer: Medicare (Managed Care)

## 2020-12-19 ENCOUNTER — Emergency Department
Admission: EM | Admit: 2020-12-19 | Discharge: 2020-12-19 | Disposition: A | Discharge status: Home / Self Care | Payer: Medicare (Managed Care) | Attending: Student in an Organized Health Care Education/Training Program | Admitting: Student in an Organized Health Care Education/Training Program

## 2020-12-19 ENCOUNTER — Other Ambulatory Visit: Payer: Self-pay

## 2020-12-19 DIAGNOSIS — Z7984 Long term (current) use of oral hypoglycemic drugs: Secondary | ICD-10-CM | POA: Diagnosis not present

## 2020-12-19 DIAGNOSIS — E119 Type 2 diabetes mellitus without complications: Secondary | ICD-10-CM | POA: Insufficient documentation

## 2020-12-19 DIAGNOSIS — I2510 Atherosclerotic heart disease of native coronary artery without angina pectoris: Secondary | ICD-10-CM | POA: Diagnosis not present

## 2020-12-19 DIAGNOSIS — I10 Essential (primary) hypertension: Secondary | ICD-10-CM | POA: Insufficient documentation

## 2020-12-19 DIAGNOSIS — Z794 Long term (current) use of insulin: Secondary | ICD-10-CM | POA: Diagnosis not present

## 2020-12-19 DIAGNOSIS — Z79899 Other long term (current) drug therapy: Secondary | ICD-10-CM | POA: Diagnosis not present

## 2020-12-19 DIAGNOSIS — R109 Unspecified abdominal pain: Secondary | ICD-10-CM | POA: Insufficient documentation

## 2020-12-19 DIAGNOSIS — N10 Acute tubulo-interstitial nephritis: Secondary | ICD-10-CM | POA: Diagnosis not present

## 2020-12-19 DIAGNOSIS — Z7982 Long term (current) use of aspirin: Secondary | ICD-10-CM | POA: Diagnosis not present

## 2020-12-19 DIAGNOSIS — R350 Frequency of micturition: Secondary | ICD-10-CM | POA: Diagnosis present

## 2020-12-19 LAB — URINALYSIS, COMPLETE (UACMP) WITH MICROSCOPIC
Bilirubin Urine: NEGATIVE
Glucose, UA: >=500 mg/dL — AB
Ketones, ur: NEGATIVE mg/dL
Nitrite: NEGATIVE
Protein, ur: NEGATIVE mg/dL
Specific Gravity, Urine: 1.014 (ref 1.005–1.030)
pH: 5 (ref 5.0–8.0)

## 2020-12-19 LAB — COMPREHENSIVE METABOLIC PANEL
ALT: 10 U/L (ref 0–44)
AST: 15 U/L (ref 15–41)
Albumin: 3.2 g/dL — ABNORMAL LOW (ref 3.5–5.0)
Alkaline Phosphatase: 81 U/L (ref 38–126)
Anion gap: 7 (ref 5–15)
BUN: 27 mg/dL — ABNORMAL HIGH (ref 8–23)
CO2: 26 mmol/L (ref 22–32)
Calcium: 9.2 mg/dL (ref 8.9–10.3)
Chloride: 104 mmol/L (ref 98–111)
Creatinine, Ser: 1.09 mg/dL — ABNORMAL HIGH (ref 0.44–1.00)
GFR, Estimated: 54 mL/min — ABNORMAL LOW (ref >60)
Glucose, Bld: 243 mg/dL — ABNORMAL HIGH (ref 70–99)
Potassium: 4.6 mmol/L (ref 3.5–5.1)
Sodium: 137 mmol/L (ref 135–145)
Total Bilirubin: 1 mg/dL (ref 0.3–1.2)
Total Protein: 6.2 g/dL — ABNORMAL LOW (ref 6.5–8.1)

## 2020-12-19 LAB — CBC WITH DIFFERENTIAL/PLATELET
Abs Immature Granulocytes: 0.05 10*3/uL (ref 0.00–0.07)
Basophils Absolute: 0 10*3/uL (ref 0.0–0.1)
Basophils Relative: 0 %
Eosinophils Absolute: 0.1 10*3/uL (ref 0.0–0.5)
Eosinophils Relative: 1 %
HCT: 35.4 % — ABNORMAL LOW (ref 36.0–46.0)
Hemoglobin: 11.3 g/dL — ABNORMAL LOW (ref 12.0–15.0)
Immature Granulocytes: 1 %
Lymphocytes Relative: 18 %
Lymphs Abs: 1.3 10*3/uL (ref 0.7–4.0)
MCH: 28.7 pg (ref 26.0–34.0)
MCHC: 31.9 g/dL (ref 30.0–36.0)
MCV: 89.8 fL (ref 80.0–100.0)
Monocytes Absolute: 0.4 10*3/uL (ref 0.1–1.0)
Monocytes Relative: 5 %
Neutro Abs: 5.5 10*3/uL (ref 1.7–7.7)
Neutrophils Relative %: 75 %
Platelets: 99 10*3/uL — ABNORMAL LOW (ref 150–400)
RBC: 3.94 MIL/uL (ref 3.87–5.11)
RDW: 13.5 % (ref 11.5–15.5)
WBC: 7.4 10*3/uL (ref 4.0–10.5)
nRBC: 0 % (ref 0.0–0.2)

## 2020-12-19 MED ORDER — SULFAMETHOXAZOLE-TRIMETHOPRIM 800-160 MG PO TABS: 1.0000 | ORAL_TABLET | Freq: Two times a day (BID) | ORAL | 0 refills | Status: AC

## 2020-12-19 MED ORDER — SODIUM CHLORIDE 0.9 % IV SOLN
1.0000 g | Freq: Once | INTRAVENOUS | Status: AC
  Administered 2020-12-19: 1 g via INTRAVENOUS
  Filled 2020-12-19: qty 10

## 2020-12-19 NOTE — ED Notes (Signed)
Patient's daughter contacted for patient's ride home. Patient's daughter reports she will be here in about an hour.

## 2020-12-19 NOTE — ED Triage Notes (Signed)
Pt states that she is having urinary frequency and back pain- states this started over night

## 2020-12-19 NOTE — ED Notes (Signed)
Patient transported to CT 

## 2020-12-21 LAB — URINE CULTURE: Culture: >=100000 — AB

## 2020-12-21 NOTE — ED Provider Notes (Signed)
[facility] Emergency Department Provider Note  ____________________________________________   Event Date/Time   First MD Initiated Contact with Patient 12/19/20 1430     (approximate)  I have reviewed the triage vital signs and the nursing notes.   HISTORY  Chief Complaint Urinary Frequency  HPI [name] Munoz is a 75 y.o. female who reports to the emergency department for evaluation of urinary frequency that began last night.  She reports associated back pain that is equal bilaterally, not worse on one side than the other.  She does report a history of UTIs, that she has not had one treated in a while.  She denies fever, abdominal pain, nausea, vomiting or other complaints.  Denies concern for STD risk.  States that [facility], however [facility] were not open today for evaluation and care.         Past Medical History:  Diagnosis Date  . Coronary artery disease   . Depression   . Diabetes mellitus without complication (HCC)   . GERD (gastroesophageal reflux disease)   . Hypertension     Patient Active Problem List   Diagnosis Date Noted  . Tibial fracture 05/15/2019  . Hypotension 10/17/2018  . UTI (urinary tract infection) 10/21/2015  . Acute renal failure (ARF) (HCC) 10/19/2015  . Intractable vomiting 09/18/2015    Past Surgical History:  Procedure Laterality Date  . ESOPHAGOGASTRODUODENOSCOPY N/A 09/20/2015   Procedure: ESOPHAGOGASTRODUODENOSCOPY (EGD);  Surgeon: Scot Jun, MD;  Location: [facility] ENDOSCOPY;  Service: Endoscopy;  Laterality: N/A;  . ORIF TIBIA PLATEAU Right 05/16/2019   Procedure: OPEN REDUCTION INTERNAL FIXATION (ORIF) TIBIAL PLATEAU;  Surgeon: Donato Heinz, MD;  Location: [facility] ORS;  Service: Orthopedics;  Laterality: Right;  . PERCUTANEOUS CORONARY ROTOBLATOR INTERVENTION (PCI-R)      Prior to Admission medications   Medication Sig Start Date End Date Taking? Authorizing Provider   sulfamethoxazole-trimethoprim (BACTRIM DS) 800-160 MG tablet Take 1 tablet by mouth 2 (two) times daily for 10 days. 12/19/20 12/29/20 Yes Lucy Chris, PA  acetaminophen (TYLENOL) 325 MG tablet Take 650 mg by mouth every 6 (six) hours as needed for mild pain.    [provider]  aspirin EC 81 MG tablet Take 81 mg by mouth daily.    [provider]  atorvastatin (LIPITOR) 40 MG tablet Take 40 mg by mouth daily.    [provider]  buPROPion (WELLBUTRIN XL) 150 MG 24 hr tablet Take 150 mg by mouth daily.    [provider]  citalopram (CELEXA) 40 MG tablet Take 40 mg by mouth daily.    [provider]  enoxaparin (LOVENOX) 40 MG/0.4ML injection Inject 0.4 mLs (40 mg total) into the skin daily for 9 days. 05/19/19 05/28/19  Jimmye Norman, NP  Exenatide ER (BYDUREON) 2 MG PEN Inject 2 mg into the skin once a week. On Mondays    [provider]  fluticasone (FLONASE) 50 MCG/ACT nasal spray Place 2 sprays into both nostrils daily.    [provider]  insulin detemir (LEVEMIR) 100 UNIT/ML injection Inject 10 Units into the skin daily.    [provider]  lisinopril (ZESTRIL) 10 MG tablet Take 10 mg by mouth daily.    [provider]  metoprolol succinate (TOPROL-XL) 25 MG 24 hr tablet Take 25 mg by mouth daily.    [provider]  ondansetron (ZOFRAN) 4 MG tablet Take 1 tablet by mouth every 6 (six) hours as  needed. 09/18/15   [provider]  pantoprazole (PROTONIX) 40 MG tablet Take 40 mg by mouth 2 (two) times daily.    [provider]  pioglitazone (ACTOS) 45 MG tablet Take 1 tablet (45 mg total) by mouth daily. 05/20/19   Jimmye Norman, NP  polyethylene glycol (MIRALAX / GLYCOLAX) packet Take 17 g by mouth daily as needed for mild constipation.    [provider]  pregabalin (LYRICA) 75 MG capsule Take 75 mg by mouth 2 (two) times daily.     [provider]  ranitidine (ZANTAC) 150 MG tablet Take 150 mg by mouth 2 (two) times daily as needed.     [provider]  vitamin B-12 (CYANOCOBALAMIN) 1000 MCG tablet Take 1,000 mcg by mouth every 30 (thirty) days.    [provider]    Allergies Erythromycin, Lamisil [terbinafine hcl], Tramadol hcl, and Penicillins  Family History  Problem Relation Age of Onset  . Diabetes Mellitus II Maternal Aunt   . Diabetes Mellitus I Other     Social History Social History   Tobacco Use  . Smoking status: Never Smoker  . Smokeless tobacco: Never Used  Substance Use Topics  . Alcohol use: No    Alcohol/week: 0.0 standard drinks  . Drug use: No    Review of Systems Constitutional: No fever/chills Eyes: No visual changes. ENT: No sore throat. Cardiovascular: Denies chest pain. Respiratory: Denies shortness of breath. Gastrointestinal: No abdominal pain.  No nausea, no vomiting.  No diarrhea.  No constipation. Genitourinary: + Dysuria, + urinary frequency Musculoskeletal: +or back pain. Skin: Negative for rash. Neurological: Negative for headaches, focal weakness or numbness.   ____________________________________________   PHYSICAL EXAM:  VITAL SIGNS: ED Triage Vitals  Enc Vitals Group     BP 12/19/20 1249 (!) 161/58     Pulse Rate 12/19/20 1249 65     Resp 12/19/20 1249 18     Temp 12/19/20 1249 98.8 F (37.1 C)     Temp Source 12/19/20 1249 Oral     SpO2 12/19/20 1249 98 %     Weight 12/19/20 1247 212 lb (96.2 kg)     Height 12/19/20 1247 5\' 9"  (1.753 m)     Head Circumference --      Peak Flow --      Pain Score 12/19/20 1246 2     Pain Loc --      Pain Edu? --      Excl. in GC? --    Constitutional: Alert and oriented. Well appearing and in no acute distress. Eyes: Conjunctivae are normal. PERRL. EOMI. Head: Atraumatic. Nose: No congestion/rhinnorhea. Mouth/Throat: Mucous membranes are moist.  Oropharynx non-erythematous. Neck: No stridor.    Cardiovascular: Normal rate, regular rhythm. Grossly normal heart sounds.  Good peripheral circulation. Respiratory: Normal respiratory effort.  No retractions. Lungs CTAB. Gastrointestinal: Soft and nontender. No distention. No abdominal bruits. No CVA tenderness. Musculoskeletal: No lower extremity tenderness nor edema.  No joint effusions. Neurologic:  Normal speech and language. No gross focal neurologic deficits are appreciated. No gait instability. Skin:  Skin is warm, dry and intact. No rash noted. Psychiatric: Mood and affect are normal. Speech and behavior are normal.  ____________________________________________   LABS (all labs ordered are listed, but only abnormal results are displayed)  Labs Reviewed  URINE CULTURE - Abnormal; Notable for the following components:      Result Value   Culture >=100,000 COLONIES/mL KLEBSIELLA PNEUMONIAE (*)    Organism ID,  Bacteria KLEBSIELLA PNEUMONIAE (*)    All other components within normal limits  URINALYSIS, COMPLETE (UACMP) WITH MICROSCOPIC - Abnormal; Notable for the following components:   Color, Urine YELLOW (*)    APPearance HAZY (*)    Glucose, UA >=500 (*)    Hgb urine dipstick SMALL (*)    Leukocytes,Ua SMALL (*)    Bacteria, UA RARE (*)    All other components within normal limits  COMPREHENSIVE METABOLIC PANEL - Abnormal; Notable for the following components:   Glucose, Bld 243 (*)    BUN 27 (*)    Creatinine, Ser 1.09 (*)    Total Protein 6.2 (*)    Albumin 3.2 (*)    GFR, Estimated 54 (*)    All other components within normal limits  CBC WITH DIFFERENTIAL/PLATELET - Abnormal; Notable for the following components:   Hemoglobin 11.3 (*)    HCT 35.4 (*)    Platelets 99 (*)    All other components within normal limits   ____________________________________________  RADIOLOGY  See radiology report for CT renal stone study findings --per their report no evidence of acute renal stone or  hydronephrosis  ____________________________________________   INITIAL IMPRESSION / ASSESSMENT AND PLAN / ED COURSE  As part of my medical decision making, I reviewed the following data within the electronic MEDICAL RECORD NUMBER Nursing notes reviewed and incorporated, Labs reviewed and Notes from prior ED visits        Patient is a 75 year old female who presents to the emergency department for evaluation of dysuria, frequency and back pain.  See HPI for further details.  In triage, the patient is mildly hypertensive but otherwise has normal vital signs.  On physical exam, she does not demonstrate any CVA tenderness or abdominal pain, however does continue to complain of feeling like she frequently has to be.  Initial urinalysis demonstrates rare bacteria, small leukocytes and a small amount of hemoglobin in addition to greater than 500 glucose.  Given the hemoglobin present, as well as the fact that the patient is postmenopausal and complaining of back pain, further laboratory evaluation and CT was obtained to rule out renal stone, hydronephrosis or other more sinister infection. CBC is reassuring with no elevated white count.  Hemoglobin is mildly decreased at 11.3, otherwise CBC is grossly within normal limits.  CMP does reveal an elevated glucose of 243 as well as a mildly elevated creatinine that is stable for the patient.  Urine will be sent for culture and sensitivities.  CT renal stone study does not reveal any acute renal stone or hydronephrosis.  Will initiate therapy with 1 g of IV Rocephin followed by outpatient Bactrim.  The patient is amenable with this plan, she stable this time for outpatient therapy.  [facility]  She'll return to the emergency department with any acute worsening.      ____________________________________________   FINAL CLINICAL IMPRESSION(S) / ED DIAGNOSES  Final diagnoses:  Acute pyelonephritis     ED Discharge Orders          Ordered    sulfamethoxazole-trimethoprim (BACTRIM DS) 800-160 MG tablet  2 times daily        12/19/20 1553          *Please note:  [name] Munoz was evaluated in Emergency Department on 12/21/2020 for the symptoms described in the history of present illness. She was evaluated in the context of the global COVID-19 pandemic, which necessitated consideration that the patient might be  at risk for infection with the SARS-CoV-2 virus that causes COVID-19. Institutional protocols and algorithms that pertain to the evaluation of patients at risk for COVID-19 are in a state of rapid change based on information released by regulatory bodies including the CDC and federal and state organizations. These policies and algorithms were followed during the patient's care in the ED.  Some ED evaluations and interventions may be delayed as a result of limited staffing during and the pandemic.*   Note:  This document was prepared using Dragon voice recognition software and may include unintentional dictation errors.   Lucy ChrisRodgers, Oliverio Cho J, PA 12/21/20 1450    Willy Eddyobinson, Patrick, MD 12/21/20 [phone]

## 2021-01-18 ENCOUNTER — Other Ambulatory Visit: Payer: Self-pay | Admitting: Family

## 2021-01-18 DIAGNOSIS — Z1231 Encounter for screening mammogram for malignant neoplasm of breast: Secondary | ICD-10-CM

## 2021-02-15 ENCOUNTER — Ambulatory Visit
Admission: RE | Admit: 2021-02-15 | Discharge: 2021-02-15 | Disposition: A | Discharge status: Home / Self Care | Payer: Medicare (Managed Care) | Source: Ambulatory Visit | Attending: Family | Admitting: Family

## 2021-02-15 ENCOUNTER — Other Ambulatory Visit: Payer: Self-pay

## 2021-02-15 DIAGNOSIS — Z1231 Encounter for screening mammogram for malignant neoplasm of breast: Secondary | ICD-10-CM | POA: Insufficient documentation

## 2021-02-17 ENCOUNTER — Other Ambulatory Visit: Payer: Self-pay | Admitting: Family

## 2021-02-17 DIAGNOSIS — R928 Other abnormal and inconclusive findings on diagnostic imaging of breast: Secondary | ICD-10-CM

## 2021-03-01 ENCOUNTER — Ambulatory Visit
Admission: RE | Admit: 2021-03-01 | Discharge: 2021-03-01 | Disposition: A | Discharge status: Home / Self Care | Payer: Medicare (Managed Care) | Source: Ambulatory Visit | Attending: Family | Admitting: Family

## 2021-03-01 ENCOUNTER — Other Ambulatory Visit: Payer: Self-pay

## 2021-03-01 DIAGNOSIS — R928 Other abnormal and inconclusive findings on diagnostic imaging of breast: Secondary | ICD-10-CM | POA: Insufficient documentation

## 2021-08-04 IMAGING — MG MM DIGITAL DIAGNOSTIC UNILAT*L* W/ TOMO W/ CAD
6 of 12 series · 6 of 36 positions shown · non-contrast
Comparison: Previous exam(s).

CLINICAL DATA: Screening recall for possible left breast
distortion.

EXAM:
DIGITAL DIAGNOSTIC UNILATERAL LEFT MAMMOGRAM WITH TOMOSYNTHESIS AND
CAD
TECHNIQUE: Left digital diagnostic mammography and breast tomosynthesis was
performed. The images were evaluated with computer-aided detection.

[L CC synth-2D (1 of 3)]
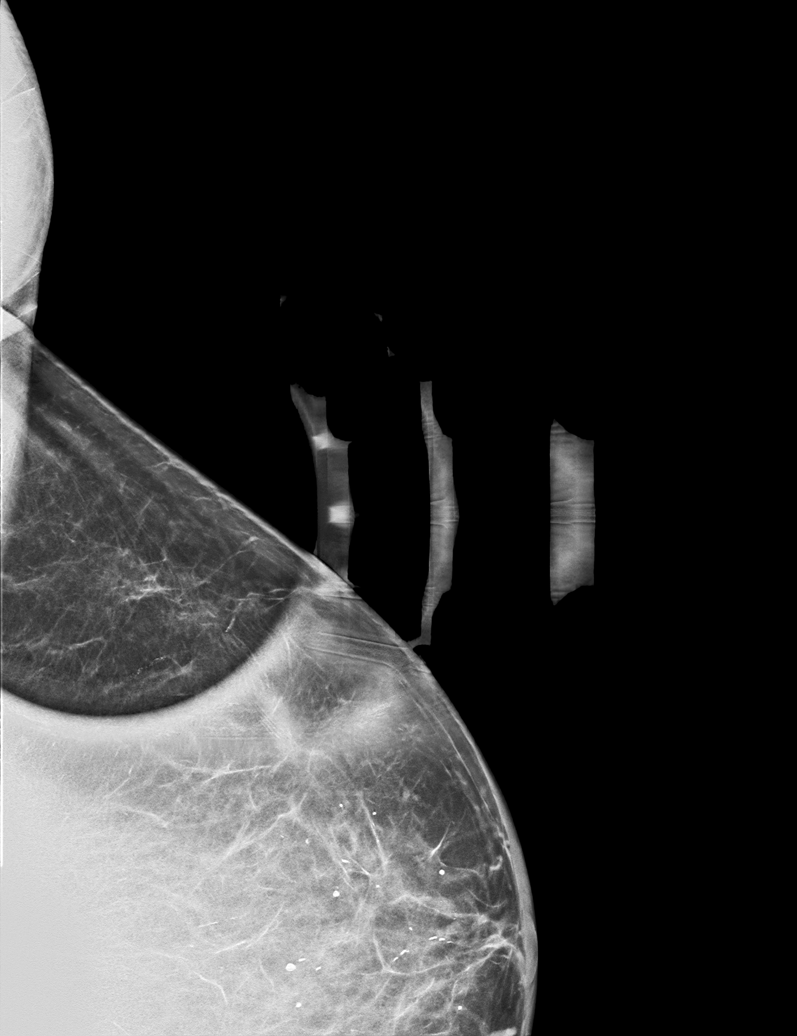

[L ML synth-2D (1 of 2)]
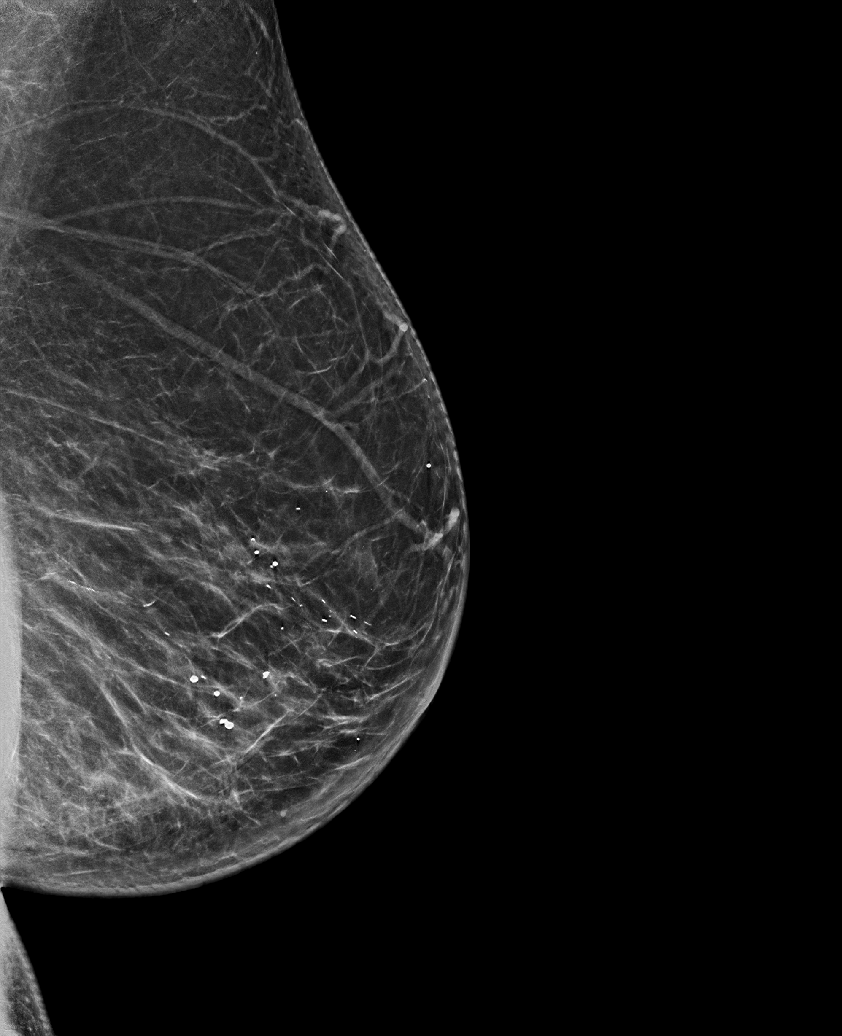

[L XCCL synth-2D]
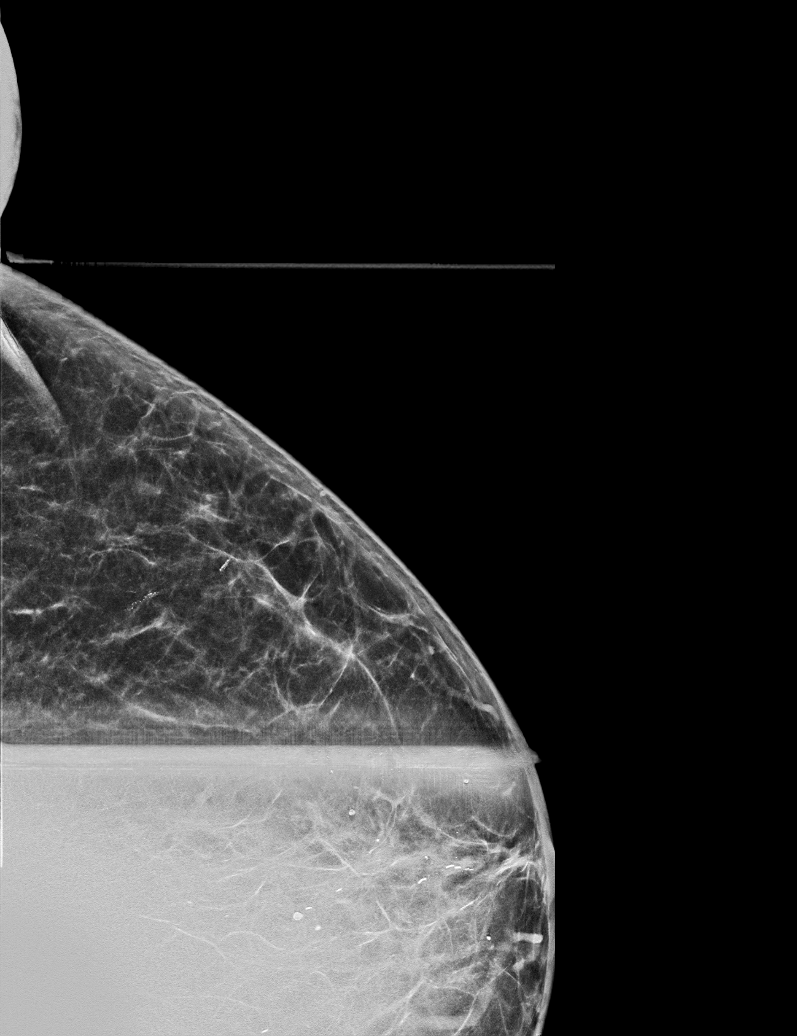

[L ML synth-2D (2 of 2)]
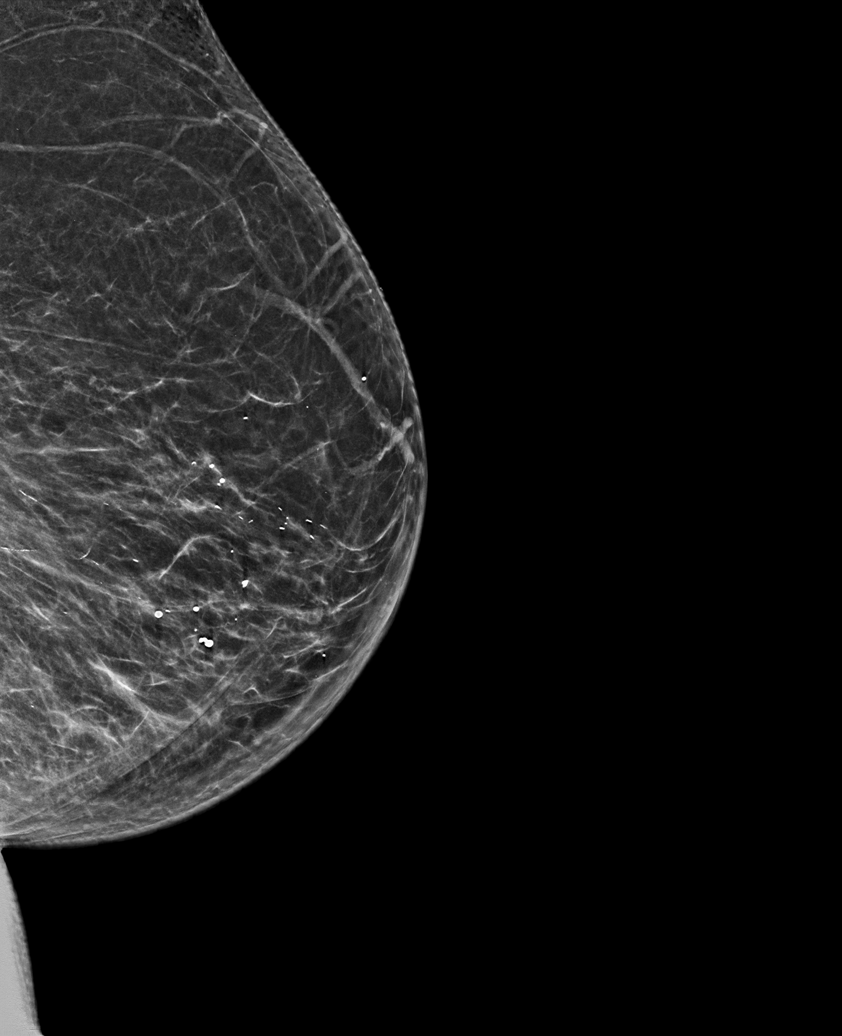

[L CC synth-2D (2 of 3)]
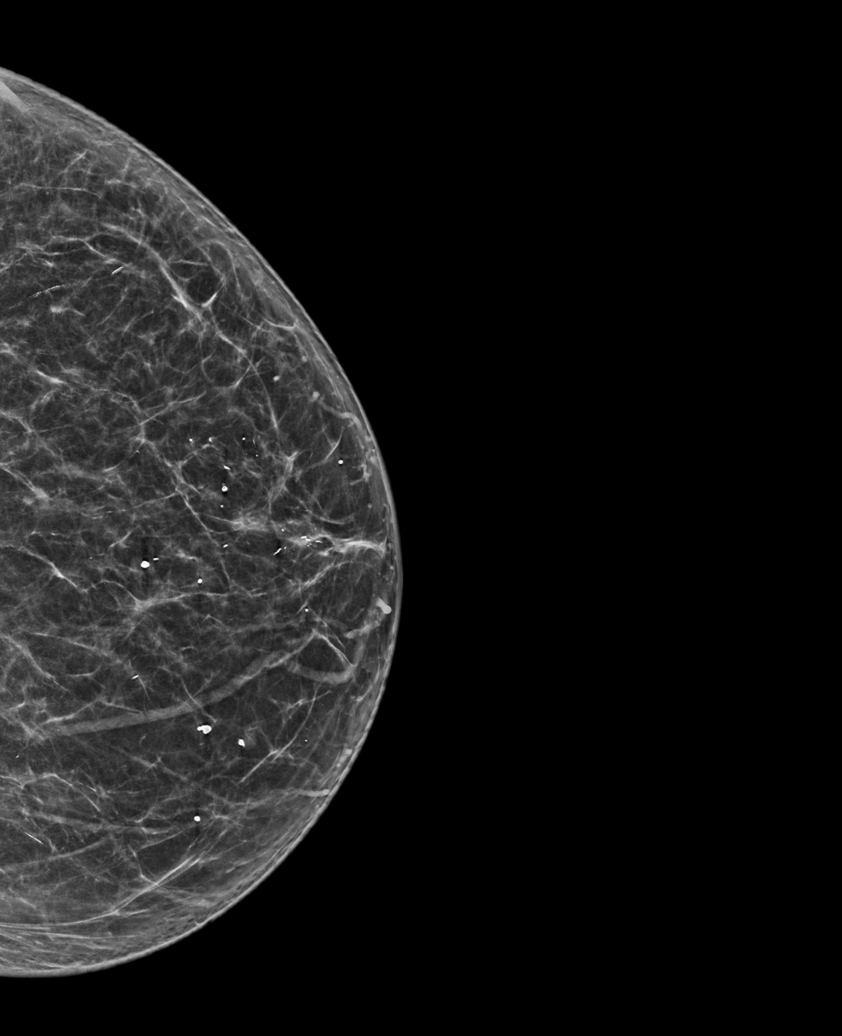

[L CC synth-2D (3 of 3)]
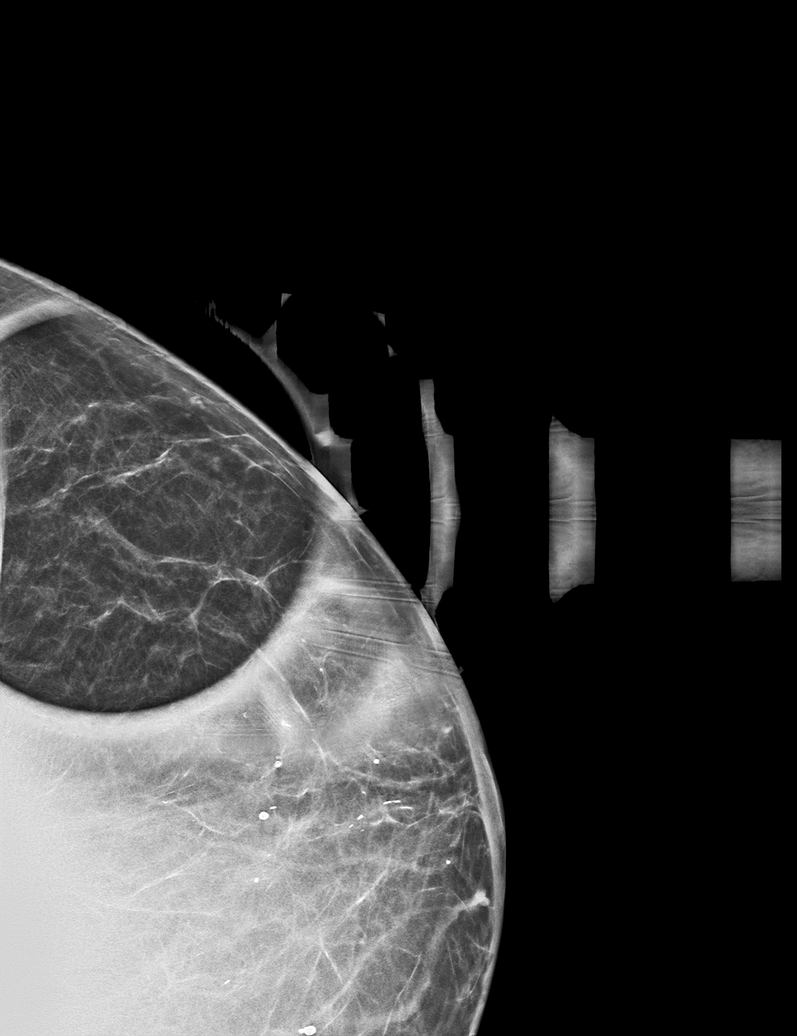

[6 of 36 positions shown; findings below may reference images not displayed]

ACR Breast Density Category b: There are scattered areas of
fibroglandular density.
FINDINGS: Spot compression tomosynthesis images through the lateral aspect of
the left breast demonstrates resolution of the appearance of
distortion. No suspicious abnormalities are identified in the left
breast.
IMPRESSION: Resolution of the questioned left breast distortion consistent with
overlapping fibroglandular tissue.

RECOMMENDATION:
Screening mammogram in one year.(Code:H4-1-T7Y)

I have discussed the findings and recommendations with the patient.
If applicable, a reminder letter will be sent to the patient
regarding the next appointment.

BI-RADS CATEGORY  1: Negative.

## 2021-08-23 ENCOUNTER — Emergency Department: Payer: Medicare (Managed Care)

## 2021-08-23 ENCOUNTER — Inpatient Hospital Stay
Admission: EM | Admit: 2021-08-23 | Discharge: 2021-08-25 | DRG: 178 | Disposition: A | Discharge status: Home / Self Care | Payer: Medicare (Managed Care) | Attending: Internal Medicine | Admitting: Internal Medicine

## 2021-08-23 DIAGNOSIS — Z881 Allergy status to other antibiotic agents status: Secondary | ICD-10-CM

## 2021-08-23 DIAGNOSIS — U071 COVID-19: Secondary | ICD-10-CM | POA: Diagnosis present

## 2021-08-23 DIAGNOSIS — I959 Hypotension, unspecified: Secondary | ICD-10-CM | POA: Diagnosis present

## 2021-08-23 DIAGNOSIS — K219 Gastro-esophageal reflux disease without esophagitis: Secondary | ICD-10-CM | POA: Diagnosis present

## 2021-08-23 DIAGNOSIS — I2510 Atherosclerotic heart disease of native coronary artery without angina pectoris: Secondary | ICD-10-CM | POA: Diagnosis present

## 2021-08-23 DIAGNOSIS — E119 Type 2 diabetes mellitus without complications: Secondary | ICD-10-CM | POA: Diagnosis present

## 2021-08-23 DIAGNOSIS — E860 Dehydration: Secondary | ICD-10-CM | POA: Diagnosis present

## 2021-08-23 DIAGNOSIS — N179 Acute kidney failure, unspecified: Secondary | ICD-10-CM | POA: Diagnosis present

## 2021-08-23 DIAGNOSIS — E875 Hyperkalemia: Secondary | ICD-10-CM | POA: Diagnosis present

## 2021-08-23 DIAGNOSIS — R680 Hypothermia, not associated with low environmental temperature: Secondary | ICD-10-CM | POA: Diagnosis present

## 2021-08-23 DIAGNOSIS — Z833 Family history of diabetes mellitus: Secondary | ICD-10-CM | POA: Diagnosis not present

## 2021-08-23 DIAGNOSIS — I10 Essential (primary) hypertension: Secondary | ICD-10-CM | POA: Diagnosis present

## 2021-08-23 DIAGNOSIS — E861 Hypovolemia: Secondary | ICD-10-CM | POA: Diagnosis not present

## 2021-08-23 DIAGNOSIS — F32A Depression, unspecified: Secondary | ICD-10-CM | POA: Diagnosis present

## 2021-08-23 DIAGNOSIS — Z880 Allergy status to penicillin: Secondary | ICD-10-CM | POA: Diagnosis not present

## 2021-08-23 DIAGNOSIS — Z794 Long term (current) use of insulin: Secondary | ICD-10-CM | POA: Diagnosis not present

## 2021-08-23 DIAGNOSIS — A419 Sepsis, unspecified organism: Secondary | ICD-10-CM

## 2021-08-23 DIAGNOSIS — Z888 Allergy status to other drugs, medicaments and biological substances status: Secondary | ICD-10-CM

## 2021-08-23 DIAGNOSIS — I9589 Other hypotension: Secondary | ICD-10-CM | POA: Diagnosis not present

## 2021-08-23 LAB — COMPREHENSIVE METABOLIC PANEL
ALT: 11 U/L (ref 0–44)
AST: 15 U/L (ref 15–41)
Albumin: 3.7 g/dL (ref 3.5–5.0)
Alkaline Phosphatase: 70 U/L (ref 38–126)
Anion gap: 6 (ref 5–15)
BUN: 34 mg/dL — ABNORMAL HIGH (ref 8–23)
CO2: 29 mmol/L (ref 22–32)
Calcium: 9.6 mg/dL (ref 8.9–10.3)
Chloride: 103 mmol/L (ref 98–111)
Creatinine, Ser: 2.08 mg/dL — ABNORMAL HIGH (ref 0.44–1.00)
GFR, Estimated: 25 mL/min — ABNORMAL LOW (ref >60)
Glucose, Bld: 95 mg/dL (ref 70–99)
Potassium: 4.4 mmol/L (ref 3.5–5.1)
Sodium: 138 mmol/L (ref 135–145)
Total Bilirubin: 1.5 mg/dL — ABNORMAL HIGH (ref 0.3–1.2)
Total Protein: 6.8 g/dL (ref 6.5–8.1)

## 2021-08-23 LAB — CBC WITH DIFFERENTIAL/PLATELET
Abs Immature Granulocytes: 0.05 10*3/uL (ref 0.00–0.07)
Basophils Absolute: 0 10*3/uL (ref 0.0–0.1)
Basophils Relative: 0 %
Eosinophils Absolute: 0.1 10*3/uL (ref 0.0–0.5)
Eosinophils Relative: 1 %
HCT: 44.2 % (ref 36.0–46.0)
Hemoglobin: 14.7 g/dL (ref 12.0–15.0)
Immature Granulocytes: 1 %
Lymphocytes Relative: 15 %
Lymphs Abs: 1.2 10*3/uL (ref 0.7–4.0)
MCH: 30.2 pg (ref 26.0–34.0)
MCHC: 33.3 g/dL (ref 30.0–36.0)
MCV: 90.9 fL (ref 80.0–100.0)
Monocytes Absolute: 0.4 10*3/uL (ref 0.1–1.0)
Monocytes Relative: 5 %
Neutro Abs: 6.6 10*3/uL (ref 1.7–7.7)
Neutrophils Relative %: 78 %
Platelets: 160 10*3/uL (ref 150–400)
RBC: 4.86 MIL/uL (ref 3.87–5.11)
RDW: 13.2 % (ref 11.5–15.5)
WBC: 8.4 10*3/uL (ref 4.0–10.5)
nRBC: 0 % (ref 0.0–0.2)

## 2021-08-23 LAB — RESP PANEL BY RT-PCR (FLU A&B, COVID) ARPGX2
Influenza A by PCR: NEGATIVE
Influenza B by PCR: NEGATIVE
SARS Coronavirus 2 by RT PCR: POSITIVE — AB

## 2021-08-23 LAB — APTT: aPTT: 25 seconds (ref 24–36)

## 2021-08-23 LAB — PROTIME-INR
INR: 0.9 (ref 0.8–1.2)
Prothrombin Time: 12.5 seconds (ref 11.4–15.2)

## 2021-08-23 LAB — LACTIC ACID, PLASMA: Lactic Acid, Venous: 1.8 mmol/L (ref 0.5–1.9)

## 2021-08-23 LAB — CBG MONITORING, ED
Glucose-Capillary: 69 mg/dL — ABNORMAL LOW (ref 70–99)
Glucose-Capillary: 149 mg/dL — ABNORMAL HIGH (ref 70–99)

## 2021-08-23 MED ORDER — SODIUM CHLORIDE 0.9 % IV SOLN
200.0000 mg | Freq: Once | INTRAVENOUS | Status: AC
  Administered 2021-08-24: 200 mg via INTRAVENOUS
  Filled 2021-08-23: qty 200

## 2021-08-23 MED ORDER — SODIUM CHLORIDE 0.9 % IV SOLN
100.0000 mg | Freq: Every day | INTRAVENOUS | Status: DC
  Administered 2021-08-24: 100 mg via INTRAVENOUS
  Filled 2021-08-23 (×2): qty 20

## 2021-08-23 MED ORDER — SODIUM CHLORIDE 0.9 % IV SOLN
1.0000 g | Freq: Once | INTRAVENOUS | Status: AC
  Administered 2021-08-23: 1 g via INTRAVENOUS
  Filled 2021-08-23: qty 10

## 2021-08-23 MED ORDER — DEXTROSE 50 % IV SOLN
25.0000 mL | Freq: Once | INTRAVENOUS | Status: AC
  Administered 2021-08-23: 25 mL via INTRAVENOUS
  Filled 2021-08-23: qty 50

## 2021-08-23 MED ORDER — SODIUM CHLORIDE 0.9 % IV BOLUS
1000.0000 mL | Freq: Once | INTRAVENOUS | Status: AC
  Administered 2021-08-23: 1000 mL via INTRAVENOUS

## 2021-08-23 NOTE — ED Triage Notes (Addendum)
Per EMS , pt called in from her home for AMS, generalized weakness. Per EMS Pt initially with slurred speech. Pt presents to ED AAOx4, respi even-unlabored. NIHS=0 , ERMD at bedside this time

## 2021-08-23 NOTE — Progress Notes (Signed)
Remdesivir - Pharmacy Brief Note   O:  ALT: 11 CXR:  SpO2: 100% on RA    A/P:  Remdesivir 200 mg IVPB once followed by 100 mg IVPB daily x 4 days.   Bessy Reaney D 08/23/2021 10:47 PM

## 2021-08-23 NOTE — H&P (Signed)
History and Physical   Louella Medaglia VQM:[id number] DOB: July 29, 1946 DOA: 08/23/2021  Referring MD/NP/PA: Dr. Roxan Hockey  PCP: Delton Prairie, FNP   Outpatient Specialists: None  Patient coming from: Home  Chief Complaint: Altered mental status and fatigue  HPI: [name] Munoz is a 75 y.o. female with medical history significant of hypertension, diabetes, depression, GERD, coronary artery disease with recent intractable nausea vomiting due to UTI who presents to the ER with generalized malaise and fatigue.  Patient was found to be hypothermic and hypotensive.  She has abdominal pain no cough or congestion.  Patient denied any recent contacts.  Patient is nauseated but denied active diarrhea although she is having the abdominal pain and a lot of gas.  She was seen in the ER received multiple boluses of fluid.  She has no respiratory symptoms but patient found to have COVID-19 positive results.  She is slightly confused but coherent and communicating.  At this point patient is diagnosed with COVID-19 infection with early sepsis due to GI manifestations symptoms.  She has a normal white count and no tachycardia or tachypnea.  Patient however is very weak and sick and therefore being admitted to the hospital for evaluation and treatment.  ED Course: Temperature is 93.9, blood pressure 140/99, pulse 59 respirate of 17 oxygen sats 99% on room air.  CBC and chemistry largely within normal.  Creatinine is 2.08.  Baseline 1.09.  Lactic acid 1.8.  COVID-19 screen is positive but negative influenza.  Chest x-ray showed showed no acute disease.  Urinalysis currently pending.  Patient has blood cultures obtained and is being admitted with GI symptoms of COVID-19 infection.  Review of Systems: As per HPI otherwise 10 point review of systems negative.    Past Medical History:  Diagnosis Date   Coronary artery disease    Depression    Diabetes mellitus without complication (HCC)    GERD (gastroesophageal  reflux disease)    Hypertension     Past Surgical History:  Procedure Laterality Date   ESOPHAGOGASTRODUODENOSCOPY N/A 09/20/2015   Procedure: ESOPHAGOGASTRODUODENOSCOPY (EGD);  Surgeon: Scot Jun, MD;  Location: [facility] ENDOSCOPY;  Service: Endoscopy;  Laterality: N/A;   ORIF TIBIA PLATEAU Right 05/16/2019   Procedure: OPEN REDUCTION INTERNAL FIXATION (ORIF) TIBIAL PLATEAU;  Surgeon: Donato Heinz, MD;  Location: [facility] ORS;  Service: Orthopedics;  Laterality: Right;   PERCUTANEOUS CORONARY ROTOBLATOR INTERVENTION (PCI-R)       reports that she has never smoked. She has never used smokeless tobacco. She reports that she does not drink alcohol and does not use drugs.  Allergies  Allergen Reactions   Erythromycin    Lamisil [Terbinafine Hcl]    Tramadol Hcl    Penicillins     Family History  Problem Relation Age of Onset   Diabetes Mellitus II Maternal Aunt    Diabetes Mellitus I Other      Prior to Admission medications   Medication Sig Start Date End Date Taking? Authorizing Provider  acetaminophen (TYLENOL) 325 MG tablet Take 650 mg by mouth every 6 (six) hours as needed for mild pain.    [provider]  aspirin EC 81 MG tablet Take 81 mg by mouth daily.    [provider]  atorvastatin (LIPITOR) 40 MG tablet Take 40 mg by mouth daily.    [provider]  buPROPion (WELLBUTRIN XL) 150 MG 24 hr tablet Take 150 mg by mouth daily.    [provider]  citalopram (CELEXA) 40 MG  tablet Take 40 mg by mouth daily.    [provider]  enoxaparin (LOVENOX) 40 MG/0.4ML injection Inject 0.4 mLs (40 mg total) into the skin daily for 9 days. 05/19/19 05/28/19  Jimmye Norman, NP  Exenatide ER (BYDUREON) 2 MG PEN Inject 2 mg into the skin once a week. On Mondays    [provider]  fluticasone (FLONASE) 50 MCG/ACT nasal spray Place 2 sprays into both nostrils daily.    [provider]  insulin detemir (LEVEMIR) 100  UNIT/ML injection Inject 10 Units into the skin daily.    [provider]  lisinopril (ZESTRIL) 10 MG tablet Take 10 mg by mouth daily.    [provider]  metoprolol succinate (TOPROL-XL) 25 MG 24 hr tablet Take 25 mg by mouth daily.    [provider]  ondansetron (ZOFRAN) 4 MG tablet Take 1 tablet by mouth every 6 (six) hours as needed. 09/18/15   [provider]  pantoprazole (PROTONIX) 40 MG tablet Take 40 mg by mouth 2 (two) times daily.    [provider]  pioglitazone (ACTOS) 45 MG tablet Take 1 tablet (45 mg total) by mouth daily. 05/20/19   Jimmye Norman, NP  polyethylene glycol (MIRALAX / GLYCOLAX) packet Take 17 g by mouth daily as needed for mild constipation.    [provider]  pregabalin (LYRICA) 75 MG capsule Take 75 mg by mouth 2 (two) times daily.     [provider]  ranitidine (ZANTAC) 150 MG tablet Take 150 mg by mouth 2 (two) times daily as needed.     [provider]  vitamin B-12 (CYANOCOBALAMIN) 1000 MCG tablet Take 1,000 mcg by mouth every 30 (thirty) days.    [provider]    Physical Exam: Vitals:   08/23/21 2042 08/23/21 2101 08/23/21 2215  BP: (!) 114/99  (!) 142/76  Pulse: (!) 59  64  Resp: 17  13  Temp: (!) 93.9 F (34.4 C)    TempSrc: Rectal    SpO2: 99%  100%  Weight:  98.9 kg   Height:  5\' 9"  (1.753 m)       Constitutional: Acutely ill looking no distress  Vitals:   08/23/21 2042 08/23/21 2101 08/23/21 2215  BP: (!) 114/99  (!) 142/76  Pulse: (!) 59  64  Resp: 17  13  Temp: (!) 93.9 F (34.4 C)    TempSrc: Rectal    SpO2: 99%  100%  Weight:  98.9 kg   Height:  5\' 9"  (1.753 m)    Eyes: PERRL, lids and conjunctivae normal ENMT: Mucous membranes are dry. Posterior pharynx clear of any exudate or lesions.Normal dentition.  Neck: normal, supple, no masses, no thyromegaly Respiratory: clear to auscultation bilaterally, no wheezing, no crackles. Normal  respiratory effort. No accessory muscle use.  Cardiovascular: Regular rate and rhythm, no murmurs / rubs / gallops. No extremity edema. 2+ pedal pulses. No carotid bruits.  Abdomen: no tenderness, no masses palpated. No hepatosplenomegaly. Bowel sounds positive.  Musculoskeletal: no clubbing / cyanosis. No joint deformity upper and lower extremities. Good ROM, no contractures. Normal muscle tone.  Skin: no rashes, lesions, ulcers. No induration Neurologic: CN 2-12 grossly intact. Sensation intact, DTR normal. Strength 5/5 in all 4.  Psychiatric: Confused. No distress.    Labs on Admission: I have personally reviewed following labs and imaging studies  CBC: Recent Labs  Lab 08/23/21 2041  WBC 8.4  NEUTROABS 6.6  HGB 14.7  HCT 44.2  MCV 90.9  PLT 160   Basic Metabolic Panel: Recent Labs  Lab 08/23/21 2041  NA 138  K 4.4  CL 103  CO2 29  GLUCOSE 95  BUN 34*  CREATININE 2.08*  CALCIUM 9.6   GFR: Estimated Creatinine Clearance: 30.2 mL/min (A) (by C-G formula based on SCr of 2.08 mg/dL (H)). Liver Function Tests: Recent Labs  Lab 08/23/21 2041  AST 15  ALT 11  ALKPHOS 70  BILITOT 1.5*  PROT 6.8  ALBUMIN 3.7   No results for input(s): LIPASE, AMYLASE in the last 168 hours. No results for input(s): AMMONIA in the last 168 hours. Coagulation Profile: Recent Labs  Lab 08/23/21 2041  INR 0.9   Cardiac Enzymes: No results for input(s): CKTOTAL, CKMB, CKMBINDEX, TROPONINI in the last 168 hours. BNP (last 3 results) No results for input(s): PROBNP in the last 8760 hours. HbA1C: No results for input(s): HGBA1C in the last 72 hours. CBG: Recent Labs  Lab 08/23/21 2044 08/23/21 2215  GLUCAP 69* 149*   Lipid Profile: No results for input(s): CHOL, HDL, LDLCALC, TRIG, CHOLHDL, LDLDIRECT in the last 72 hours. Thyroid Function Tests: No results for input(s): TSH, T4TOTAL, FREET4, T3FREE, THYROIDAB in the last 72 hours. Anemia Panel: No results for input(s):  VITAMINB12, FOLATE, FERRITIN, TIBC, IRON, RETICCTPCT in the last 72 hours. Urine analysis:    Component Value Date/Time   COLORURINE YELLOW (A) 12/19/2020 1413   APPEARANCEUR HAZY (A) 12/19/2020 1413   APPEARANCEUR Cloudy 05/30/2013 2026   LABSPEC 1.014 12/19/2020 1413   LABSPEC 1.014 05/30/2013 2026   PHURINE 5.0 12/19/2020 1413   GLUCOSEU >=500 (A) 12/19/2020 1413   GLUCOSEU 50 mg/dL 69/62/9528 4132   HGBUR SMALL (A) 12/19/2020 1413   BILIRUBINUR NEGATIVE 12/19/2020 1413   BILIRUBINUR Negative 05/30/2013 2026   KETONESUR NEGATIVE 12/19/2020 1413   PROTEINUR NEGATIVE 12/19/2020 1413   NITRITE NEGATIVE 12/19/2020 1413   LEUKOCYTESUR SMALL (A) 12/19/2020 1413   LEUKOCYTESUR 3+ 05/30/2013 2026   Sepsis Labs: @LABRCNTIP (procalcitonin:4,lacticidven:4) ) Recent Results (from the past 240 hour(s))  Resp Panel by RT-PCR (Flu A&B, Covid) Nasopharyngeal Swab     Status: Abnormal   Collection Time: 08/23/21  8:42 PM   Specimen: Nasopharyngeal Swab; Nasopharyngeal(NP) swabs in vial transport medium  Result Value Ref Range Status   SARS Coronavirus 2 by RT PCR POSITIVE (A) NEGATIVE Final    Comment: RESULT CALLED TO, READ BACK BY AND VERIFIED WITH: JOANA CEBALLOS @2153  ON 08/23/21 SKL (NOTE) SARS-CoV-2 target nucleic acids are DETECTED.  The SARS-CoV-2 RNA is generally detectable in upper respiratory specimens during the acute phase of infection. Positive results are indicative of the presence of the identified virus, but do not rule out bacterial infection or co-infection with other pathogens not detected by the test. Clinical correlation with patient history and other diagnostic information is necessary to determine patient infection status. The expected result is Negative.  Fact Sheet for Patients:  Fact Sheet for Healthcare Providers: 08/25/21  This test is not yet approved or cleared by the [employer] FDA and  has been authorized for detection and/or diagnosis of SARS-CoV-2 by FDA under an Emergency Use Authorization (EUA).  This EUA will remain in effect (meaning this test can  be used) for the duration of  the COVID-19 declaration under Section 564(b)(1) of the Act, 21 U.S.C. section 360bbb-3(b)(1), unless the authorization is terminated or revoked sooner.     Influenza A by PCR NEGATIVE NEGATIVE Final   Influenza B by  PCR NEGATIVE NEGATIVE Final    Comment: (NOTE) The Xpert Xpress SARS-CoV-2/FLU/RSV plus assay is intended as an aid in the diagnosis of influenza from Nasopharyngeal swab specimens and should not be used as a sole basis for treatment. Nasal washings and aspirates are unacceptable for Xpert Xpress SARS-CoV-2/FLU/RSV testing.  Fact Sheet for Patients: [employer]com  Fact Sheet for Healthcare Providers: Serious[employer]ker[employer]it  This test is not yet approved or cleared by the [place] FDA and has been authorized for detection and/or diagnosis of SARS-CoV-2 by FDA under an Emergency Use Authorization (EUA). This EUA will remain in effect (meaning this test can be used) for the duration of the COVID-19 declaration under Section 564(b)(1) of the Act, 21 U.S.C. section 360bbb-3(b)(1), unless the authorization is terminated or revoked.  Performed at [facility], [address], [place], [place] [address]      Radiological Exams on Admission: CT ABDOMEN PELVIS WO CONTRAST  Result Date: 08/23/2021 CLINICAL DATA:  Abdominal abscess/infection suspected. EXAM: CT ABDOMEN AND PELVIS WITHOUT CONTRAST TECHNIQUE: Multidetector CT imaging of the abdomen and pelvis was performed following the standard protocol without IV contrast. COMPARISON:  CT abdomen pelvis dated 12/19/2020. FINDINGS: Evaluation of this exam is limited in the absence of intravenous contrast. Lower chest: The visualized lung bases are  clear. Coronary vascular calcifications noted. No intra-abdominal free air or free fluid. Hepatobiliary: The liver is unremarkable. No intrahepatic biliary ductal dilatation. Layering sludge or small stones. No pericholecystic fluid or evidence of acute cholecystitis by CT. Pancreas: Unremarkable. No pancreatic ductal dilatation or surrounding inflammatory changes. Spleen: Normal in size without focal abnormality. Adrenals/Urinary Tract: Left adrenal thickening or adenoma measuring up to 17 mm. The right adrenal gland is unremarkable. There is no hydronephrosis or nephrolithiasis on either side. Left renal cysts measure up to 3 cm in the upper pole. The visualized ureters and urinary bladder appear unremarkable. Small amount of air within the urinary bladder may be introduced via recent instrumentation. Stomach/Bowel: Moderate stool throughout the colon. There is a small hiatal hernia. Several normal caliber fluid-filled loops of small bowel with edema and stranding of the associated mesentery most consistent with enteritis. Clinical correlation is recommended. No bowel obstruction. The appendix is normal. There is sigmoid diverticulosis without active inflammatory changes. Vascular/Lymphatic: Moderate aortoiliac atherosclerotic disease. The IVC is unremarkable. No portal gas. There is no adenopathy. Reproductive: The uterus is grossly unremarkable. No adnexal masses. Other: None Musculoskeletal: No acute or significant osseous findings. IMPRESSION: 1. Enteritis. No bowel obstruction. Normal appendix. 2. Sigmoid diverticulosis. 3. No hydronephrosis or nephrolithiasis. 4. Aortic Atherosclerosis (ICD10-I70.0). Electronically Signed   By: Elgie Collard M.D.   On: 08/23/2021 22:01   CT HEAD WO CONTRAST ( )  Result Date: 08/23/2021 CLINICAL DATA:  Mental status change, unknown cause EXAM: CT HEAD WITHOUT CONTRAST TECHNIQUE: Contiguous axial images were obtained from the base of the skull through the vertex  without intravenous contrast. COMPARISON:  04/04/2017 FINDINGS: Brain: There is atrophy and chronic small vessel disease changes. No acute intracranial abnormality. Specifically, no hemorrhage, hydrocephalus, mass lesion, acute infarction, or significant intracranial injury. Vascular: No hyperdense vessel or unexpected calcification. Skull: No acute calvarial abnormality. Sinuses/Orbits: Old right medial orbital wall blowout fracture. No air-fluid levels. Other: None IMPRESSION: Atrophy, chronic microvascular disease. No acute intracranial abnormality. Electronically Signed   By: Charlett Nose M.D.   On: 08/23/2021 21:59   DG Chest Port 1 View  Result Date: 08/23/2021 CLINICAL DATA:  Sepsis EXAM: PORTABLE CHEST 1 VIEW COMPARISON:  05/15/2019  FINDINGS: Heart and mediastinal contours are within normal limits. No focal opacities or effusions. No acute bony abnormality. IMPRESSION: No active disease. Electronically Signed   By: Charlett Nose M.D.   On: 08/23/2021 21:14      Assessment/Plan Principal Problem:   COVID-19 virus infection Active Problems:   Acute renal failure (ARF) (HCC)   Hypotension   CAD (coronary artery disease)   Diabetes (HCC)   GERD (gastroesophageal reflux disease)   Essential hypertension     #1 COVID-19 virus infection: Appears to have GI manifestations at this point.  Patient is hypotensive and hypothermic.  Very symptomatic.  We will admit and hydrate gently.  Initiate remdesivir and dexamethasone for the GI manifestation of COVID-19.  Follow COVID-19 labs and supportive care.  Follow blood cultures.  #2 diabetes: Insulin-dependent.  Continue long-acting as well as sliding scale insulin.  #3 transient hypotension: Secondary to viral infection.  Hydrate and monitor  #4 essential hypertension: Continue with blood pressure monitoring.  #5 GERD: Continue with PPIs  #6 AKI: Most likely following dehydration.  Aggressively hydrate and monitor.  #7 mild confusion: Due  to acute illness.  Continue to monitor.   DVT prophylaxis: Lovenox Code Status: Full code Family Communication: No family at bedside Disposition Plan: Home Consults called: None Admission status: Inpatient  Severity of Illness: The appropriate patient status for this patient is INPATIENT. Inpatient status is judged to be reasonable and necessary in order to provide the required intensity of service to ensure the patient's safety. The patient's presenting symptoms, physical exam findings, and initial radiographic and laboratory data in the context of their chronic comorbidities is felt to place them at high risk for further clinical deterioration. Furthermore, it is not anticipated that the patient will be medically stable for discharge from the hospital within 2 midnights of admission.   * I certify that at the point of admission it is my clinical judgment that the patient will require inpatient hospital care spanning beyond 2 midnights from the point of admission due to high intensity of service, high risk for further deterioration and high frequency of surveillance required.Lonia Blood MD [facility] Pager [phone]  If 7PM-7AM, please contact night-coverage [email or web address] Password [email or web address]  08/23/2021, 11:12 PM

## 2021-08-23 NOTE — ED Notes (Signed)
Pt given 2 cups orange juice to drink, tolerated well w/o n/v/distress

## 2021-08-23 NOTE — ED Provider Notes (Signed)
[facility] Emergency Department Provider Note    Event Date/Time   First MD Initiated Contact with Patient 08/23/21 2036     (approximate)  I have reviewed the triage vital signs and the nursing notes.   HISTORY  Chief Complaint Altered Mental Status and Fatigue    HPI [name] Munoz is a 75 y.o. female with below listed pmh presents with cc of generalized malaise and fatigue.  Found to be hypothermic and hypotensive by EMS.  Complains of abdominal pain. No cough or congestion.  No recent sick contacts.  No recent abx.    Past Medical History:  Diagnosis Date   Coronary artery disease    Depression    Diabetes mellitus without complication (HCC)    GERD (gastroesophageal reflux disease)    Hypertension    Family History  Problem Relation Age of Onset   Diabetes Mellitus II Maternal Aunt    Diabetes Mellitus I Other    Past Surgical History:  Procedure Laterality Date   ESOPHAGOGASTRODUODENOSCOPY N/A 09/20/2015   Procedure: ESOPHAGOGASTRODUODENOSCOPY (EGD);  Surgeon: Scot Jun, MD;  Location: [facility] ENDOSCOPY;  Service: Endoscopy;  Laterality: N/A;   ORIF TIBIA PLATEAU Right 05/16/2019   Procedure: OPEN REDUCTION INTERNAL FIXATION (ORIF) TIBIAL PLATEAU;  Surgeon: Donato Heinz, MD;  Location: [facility] ORS;  Service: Orthopedics;  Laterality: Right;   PERCUTANEOUS CORONARY ROTOBLATOR INTERVENTION (PCI-R)     Patient Active Problem List   Diagnosis Date Noted   CAD (coronary artery disease) 08/23/2021   Tibial fracture 05/15/2019   Hypotension 10/17/2018   UTI (urinary tract infection) 10/21/2015   Acute renal failure (ARF) (HCC) 10/19/2015   Intractable vomiting 09/18/2015      Prior to Admission medications   Medication Sig Start Date End Date Taking? Authorizing Provider  acetaminophen (TYLENOL) 325 MG tablet Take 650 mg by mouth every 6 (six) hours as needed for mild pain.    [provider]  aspirin EC 81 MG tablet Take 81  mg by mouth daily.    [provider]  atorvastatin (LIPITOR) 40 MG tablet Take 40 mg by mouth daily.    [provider]  buPROPion (WELLBUTRIN XL) 150 MG 24 hr tablet Take 150 mg by mouth daily.    [provider]  citalopram (CELEXA) 40 MG tablet Take 40 mg by mouth daily.    [provider]  enoxaparin (LOVENOX) 40 MG/0.4ML injection Inject 0.4 mLs (40 mg total) into the skin daily for 9 days. 05/19/19 05/28/19  Jimmye Norman, NP  Exenatide ER (BYDUREON) 2 MG PEN Inject 2 mg into the skin once a week. On Mondays    [provider]  fluticasone (FLONASE) 50 MCG/ACT nasal spray Place 2 sprays into both nostrils daily.    [provider]  insulin detemir (LEVEMIR) 100 UNIT/ML injection Inject 10 Units into the skin daily.    [provider]  lisinopril (ZESTRIL) 10 MG tablet Take 10 mg by mouth daily.    [provider]  metoprolol succinate (TOPROL-XL) 25 MG 24 hr tablet Take 25 mg by mouth daily.    [provider]  ondansetron (ZOFRAN) 4 MG tablet Take 1 tablet by mouth every 6 (six) hours as needed. 09/18/15   [provider]  pantoprazole (PROTONIX) 40 MG tablet Take 40 mg by mouth 2 (two) times daily.    [provider]  pioglitazone (ACTOS) 45 MG tablet Take 1 tablet (45 mg total) by mouth daily. 05/20/19  Jimmye Norman, NP  polyethylene glycol (MIRALAX / GLYCOLAX) packet Take 17 g by mouth daily as needed for mild constipation.    [provider]  pregabalin (LYRICA) 75 MG capsule Take 75 mg by mouth 2 (two) times daily.     [provider]  ranitidine (ZANTAC) 150 MG tablet Take 150 mg by mouth 2 (two) times daily as needed.     [provider]  vitamin B-12 (CYANOCOBALAMIN) 1000 MCG tablet Take 1,000 mcg by mouth every 30 (thirty) days.    [provider]    Allergies Erythromycin, Lamisil [terbinafine hcl], Tramadol hcl, and  Penicillins    Social History Social History   Tobacco Use   Smoking status: Never   Smokeless tobacco: Never  Substance Use Topics   Alcohol use: No    Alcohol/week: 0.0 standard drinks   Drug use: No    Review of Systems Patient denies headaches, rhinorrhea, blurry vision, numbness, shortness of breath, chest pain, edema, cough, abdominal pain, nausea, vomiting, diarrhea, dysuria, fevers, rashes or hallucinations unless otherwise stated above in HPI. ____________________________________________   PHYSICAL EXAM:  VITAL SIGNS: Vitals:   08/23/21 2042 08/23/21 2215  BP: (!) 114/99 (!) 142/76  Pulse: (!) 59 64  Resp: 17 13  Temp: (!) 93.9 F (34.4 C)   SpO2: 99% 100%    Constitutional: Alert and oriented.  Eyes: Conjunctivae are normal.  Head: Atraumatic. Nose: No congestion/rhinnorhea. Mouth/Throat: Mucous membranes are moist.   Neck: No stridor. Painless ROM.  Cardiovascular: Normal rate, regular rhythm. Grossly normal heart sounds.  Good peripheral circulation. Respiratory: Normal respiratory effort.  No retractions. Lungs CTAB. Gastrointestinal: Soft with mild suprapubic ttp. No distention. No abdominal bruits. No CVA tenderness. Genitourinary:  Musculoskeletal: No lower extremity tenderness nor edema.  No joint effusions. Neurologic:  Normal speech and language. No gross focal neurologic deficits are appreciated. No facial droop Skin:  Skin is warm, dry and intact. No rash noted. Psychiatric: Mood and affect are normal. Speech and behavior are normal.  ____________________________________________   LABS (all labs ordered are listed, but only abnormal results are displayed)  Results for orders placed or performed during the hospital encounter of 08/23/21 (from the past 24 hour(s))  Lactic acid, plasma     Status: None   Collection Time: 08/23/21  8:41 PM  Result Value Ref Range   Lactic Acid, Venous 1.8 0.5 - 1.9 mmol/L  Comprehensive metabolic panel      Status: Abnormal   Collection Time: 08/23/21  8:41 PM  Result Value Ref Range   Sodium 138 135 - 145 mmol/L   Potassium 4.4 3.5 - 5.1 mmol/L   Chloride 103 98 - 111 mmol/L   CO2 29 22 - 32 mmol/L   Glucose, Bld 95 70 - 99 mg/dL   BUN 34 (H) 8 - 23 mg/dL   Creatinine, Ser 9.52 (H) 0.44 - 1.00 mg/dL   Calcium 9.6 8.9 - 84.1 mg/dL   Total Protein 6.8 6.5 - 8.1 g/dL   Albumin 3.7 3.5 - 5.0 g/dL   AST 15 15 - 41 U/L   ALT 11 0 - 44 U/L   Alkaline Phosphatase 70 38 - 126 U/L   Total Bilirubin 1.5 (H) 0.3 - 1.2 mg/dL   GFR, Estimated 25 (L) >60 mL/min   Anion gap 6 5 - 15  CBC WITH DIFFERENTIAL     Status: None   Collection Time: 08/23/21  8:41 PM  Result Value Ref Range   WBC  8.4 4.0 - 10.5 K/uL   RBC 4.86 3.87 - 5.11 MIL/uL   Hemoglobin 14.7 12.0 - 15.0 g/dL   HCT 36.6 44.0 - 34.7 %   MCV 90.9 80.0 - 100.0 fL   MCH 30.2 26.0 - 34.0 pg   MCHC 33.3 30.0 - 36.0 g/dL   RDW 42.5 95.6 - 38.7 %   Platelets 160 150 - 400 K/uL   nRBC 0.0 0.0 - 0.2 %   Neutrophils Relative % 78 %   Neutro Abs 6.6 1.7 - 7.7 K/uL   Lymphocytes Relative 15 %   Lymphs Abs 1.2 0.7 - 4.0 K/uL   Monocytes Relative 5 %   Monocytes Absolute 0.4 0.1 - 1.0 K/uL   Eosinophils Relative 1 %   Eosinophils Absolute 0.1 0.0 - 0.5 K/uL   Basophils Relative 0 %   Basophils Absolute 0.0 0.0 - 0.1 K/uL   Immature Granulocytes 1 %   Abs Immature Granulocytes 0.05 0.00 - 0.07 K/uL  Protime-INR     Status: None   Collection Time: 08/23/21  8:41 PM  Result Value Ref Range   Prothrombin Time 12.5 11.4 - 15.2 seconds   INR 0.9 0.8 - 1.2  APTT     Status: None   Collection Time: 08/23/21  8:41 PM  Result Value Ref Range   aPTT 25 24 - 36 seconds  Resp Panel by RT-PCR (Flu A&B, Covid) Nasopharyngeal Swab     Status: Abnormal   Collection Time: 08/23/21  8:42 PM   Specimen: Nasopharyngeal Swab; Nasopharyngeal(NP) swabs in vial transport medium  Result Value Ref Range   SARS Coronavirus 2 by RT PCR POSITIVE (A) NEGATIVE    Influenza A by PCR NEGATIVE NEGATIVE   Influenza B by PCR NEGATIVE NEGATIVE  CBG monitoring, ED     Status: Abnormal   Collection Time: 08/23/21  8:44 PM  Result Value Ref Range   Glucose-Capillary 69 (L) 70 - 99 mg/dL  CBG monitoring, ED     Status: Abnormal   Collection Time: 08/23/21 10:15 PM  Result Value Ref Range   Glucose-Capillary 149 (H) 70 - 99 mg/dL   ____________________________________________  EKG My review and personal interpretation at Time: 20:58   Indication: hypotension  Rate: 60  Rhythm: sinus Axis: normal Other: nonspecific st abn, no depressions ____________________________________________  RADIOLOGY  I personally reviewed all radiographic images ordered to evaluate for the above acute complaints and reviewed radiology reports and findings.  These findings were personally discussed with the patient.  Please see medical record for radiology report.  ____________________________________________   PROCEDURES  Procedure(s) performed:  Procedures    Critical Care performed: no ____________________________________________   INITIAL IMPRESSION / ASSESSMENT AND PLAN / ED COURSE  Pertinent labs & imaging results that were available during my care of the patient were reviewed by me and considered in my medical decision making (see chart for details).   DDX: sepsis, dehydration, chf, hypothermia  [name] Munoz is a 75 y.o. who presents to the ED with symptoms as described above.  Patient is hypothermic hypotensive with EMS given IV fluids here.  Blood work sent for the above differential concern for sepsis.  Imaging will be ordered.  The patient will be placed on continuous pulse oximetry and telemetry for monitoring.  Laboratory evaluation will be sent to evaluate for the above complaints.     Clinical Course as of 08/23/21 2307  Tue Aug 23, 2021  2231 Patient is COVID-positive.  Her hypotension and hypothermia meeting  sepsis criteria I do think that she  warrants admission to the hospital for IV hydration observation as well as IV remdesivir.  Will discuss with hospitalist for admission. [PR]    Clinical Course User Index [PR] Willy Eddy, MD    The patient was evaluated in Emergency Department today for the symptoms described in the history of present illness. He/she was evaluated in the context of the global COVID-19 pandemic, which necessitated consideration that the patient might be at risk for infection with the SARS-CoV-2 virus that causes COVID-19. Institutional protocols and algorithms that pertain to the evaluation of patients at risk for COVID-19 are in a state of rapid change based on information released by regulatory bodies including the CDC and federal and state organizations. These policies and algorithms were followed during the patient's care in the ED.  As part of my medical decision making, I reviewed the following data within the electronic MEDICAL RECORD NUMBER Nursing notes reviewed and incorporated, Labs reviewed, notes from prior ED visits and [place] Controlled Substance Database   ____________________________________________   FINAL CLINICAL IMPRESSION(S) / ED DIAGNOSES  Final diagnoses:  Sepsis, due to unspecified organism, unspecified whether acute organ dysfunction present (HCC)  COVID-19      NEW MEDICATIONS STARTED DURING THIS VISIT:  New Prescriptions   No medications on file     Note:  This document was prepared using Dragon voice recognition software and may include unintentional dictation errors.    Willy Eddy, MD 08/23/21 [phone]

## 2021-08-24 DIAGNOSIS — K219 Gastro-esophageal reflux disease without esophagitis: Secondary | ICD-10-CM

## 2021-08-24 DIAGNOSIS — E861 Hypovolemia: Secondary | ICD-10-CM

## 2021-08-24 DIAGNOSIS — I9589 Other hypotension: Secondary | ICD-10-CM

## 2021-08-24 DIAGNOSIS — N179 Acute kidney failure, unspecified: Secondary | ICD-10-CM

## 2021-08-24 LAB — URINALYSIS, COMPLETE (UACMP) WITH MICROSCOPIC
Bilirubin Urine: NEGATIVE
Glucose, UA: >=500 mg/dL — AB
Hgb urine dipstick: NEGATIVE
Ketones, ur: 5 mg/dL — AB
Nitrite: NEGATIVE
Protein, ur: NEGATIVE mg/dL
Specific Gravity, Urine: 1.014 (ref 1.005–1.030)
WBC, UA: >50 WBC/hpf — ABNORMAL HIGH (ref 0–5)
pH: 5 (ref 5.0–8.0)

## 2021-08-24 LAB — CBC WITH DIFFERENTIAL/PLATELET
Abs Immature Granulocytes: 0.03 10*3/uL (ref 0.00–0.07)
Basophils Absolute: 0 10*3/uL (ref 0.0–0.1)
Basophils Relative: 0 %
Eosinophils Absolute: 0 10*3/uL (ref 0.0–0.5)
Eosinophils Relative: 0 %
HCT: 43.6 % (ref 36.0–46.0)
Hemoglobin: 14.3 g/dL (ref 12.0–15.0)
Immature Granulocytes: 0 %
Lymphocytes Relative: 6 %
Lymphs Abs: 0.5 10*3/uL — ABNORMAL LOW (ref 0.7–4.0)
MCH: 29.5 pg (ref 26.0–34.0)
MCHC: 32.8 g/dL (ref 30.0–36.0)
MCV: 89.9 fL (ref 80.0–100.0)
Monocytes Absolute: 0.2 10*3/uL (ref 0.1–1.0)
Monocytes Relative: 2 %
Neutro Abs: 6.4 10*3/uL (ref 1.7–7.7)
Neutrophils Relative %: 92 %
Platelets: 114 10*3/uL — ABNORMAL LOW (ref 150–400)
RBC: 4.85 MIL/uL (ref 3.87–5.11)
RDW: 13.4 % (ref 11.5–15.5)
WBC: 7.1 10*3/uL (ref 4.0–10.5)
nRBC: 0 % (ref 0.0–0.2)

## 2021-08-24 LAB — COMPREHENSIVE METABOLIC PANEL
ALT: 10 U/L (ref 0–44)
AST: 13 U/L — ABNORMAL LOW (ref 15–41)
Albumin: 3.4 g/dL — ABNORMAL LOW (ref 3.5–5.0)
Alkaline Phosphatase: 67 U/L (ref 38–126)
Anion gap: 10 (ref 5–15)
BUN: 31 mg/dL — ABNORMAL HIGH (ref 8–23)
CO2: 24 mmol/L (ref 22–32)
Calcium: 8.9 mg/dL (ref 8.9–10.3)
Chloride: 101 mmol/L (ref 98–111)
Creatinine, Ser: 1.77 mg/dL — ABNORMAL HIGH (ref 0.44–1.00)
GFR, Estimated: 30 mL/min — ABNORMAL LOW (ref >60)
Glucose, Bld: 311 mg/dL — ABNORMAL HIGH (ref 70–99)
Potassium: 5.2 mmol/L — ABNORMAL HIGH (ref 3.5–5.1)
Sodium: 135 mmol/L (ref 135–145)
Total Bilirubin: 1.1 mg/dL (ref 0.3–1.2)
Total Protein: 6.2 g/dL — ABNORMAL LOW (ref 6.5–8.1)

## 2021-08-24 LAB — HEMOGLOBIN A1C
Hgb A1c MFr Bld: 11.9 % — ABNORMAL HIGH (ref 4.8–5.6)
Mean Plasma Glucose: 295 mg/dL

## 2021-08-24 LAB — MAGNESIUM: Magnesium: 1.7 mg/dL (ref 1.7–2.4)

## 2021-08-24 LAB — CBG MONITORING, ED
Glucose-Capillary: 192 mg/dL — ABNORMAL HIGH (ref 70–99)
Glucose-Capillary: 335 mg/dL — ABNORMAL HIGH (ref 70–99)
Glucose-Capillary: 491 mg/dL — ABNORMAL HIGH (ref 70–99)

## 2021-08-24 LAB — LACTIC ACID, PLASMA: Lactic Acid, Venous: 1.3 mmol/L (ref 0.5–1.9)

## 2021-08-24 LAB — PHOSPHORUS: Phosphorus: 2.8 mg/dL (ref 2.5–4.6)

## 2021-08-24 LAB — C-REACTIVE PROTEIN: CRP: 1.6 mg/dL — ABNORMAL HIGH (ref <1.0)

## 2021-08-24 LAB — D-DIMER, QUANTITATIVE: D-Dimer, Quant: 11.55 ug/mL-FEU — ABNORMAL HIGH (ref 0.00–0.50)

## 2021-08-24 LAB — FERRITIN: Ferritin: 35 ng/mL (ref 11–307)

## 2021-08-24 MED ORDER — ONDANSETRON HCL 4 MG PO TABS: 4.0000 mg | ORAL_TABLET | Freq: Four times a day (QID) | ORAL | Status: DC | PRN

## 2021-08-24 MED ORDER — PANTOPRAZOLE SODIUM 40 MG PO TBEC
40.0000 mg | DELAYED_RELEASE_TABLET | Freq: Every day | ORAL | Status: DC
  Administered 2021-08-24: 40 mg via ORAL
  Filled 2021-08-24: qty 1

## 2021-08-24 MED ORDER — ENOXAPARIN SODIUM 60 MG/0.6ML IJ SOSY: 0.5000 mg/kg | PREFILLED_SYRINGE | INTRAMUSCULAR | Status: DC

## 2021-08-24 MED ORDER — DEXAMETHASONE SODIUM PHOSPHATE 10 MG/ML IJ SOLN
6.0000 mg | INTRAMUSCULAR | Status: DC
  Administered 2021-08-24 – 2021-08-25 (×2): 6 mg via INTRAVENOUS
  Filled 2021-08-24 (×2): qty 1

## 2021-08-24 MED ORDER — INSULIN ASPART 100 UNIT/ML IJ SOLN
0.0000 [IU] | Freq: Every day | INTRAMUSCULAR | Status: DC
  Administered 2021-08-24: 4 [IU] via SUBCUTANEOUS
  Filled 2021-08-24: qty 1

## 2021-08-24 MED ORDER — INSULIN ASPART 100 UNIT/ML IJ SOLN
3.0000 [IU] | Freq: Three times a day (TID) | INTRAMUSCULAR | Status: DC
  Administered 2021-08-24: 5 [IU] via SUBCUTANEOUS
  Administered 2021-08-25: 3 [IU] via SUBCUTANEOUS
  Filled 2021-08-24 (×2): qty 1

## 2021-08-24 MED ORDER — ACETAMINOPHEN 325 MG PO TABS: 650.0000 mg | ORAL_TABLET | Freq: Four times a day (QID) | ORAL | Status: DC | PRN

## 2021-08-24 MED ORDER — INSULIN ASPART 100 UNIT/ML IJ SOLN
0.0000 [IU] | Freq: Three times a day (TID) | INTRAMUSCULAR | Status: DC
  Administered 2021-08-24: 15 [IU] via SUBCUTANEOUS
  Administered 2021-08-25: 8 [IU] via SUBCUTANEOUS
  Filled 2021-08-24 (×2): qty 1

## 2021-08-24 MED ORDER — INSULIN DETEMIR 100 UNIT/ML ~~LOC~~ SOLN
10.0000 [IU] | Freq: Every day | SUBCUTANEOUS | Status: DC
  Administered 2021-08-24: 10 [IU] via SUBCUTANEOUS
  Filled 2021-08-24 (×2): qty 0.1

## 2021-08-24 MED ORDER — LACTATED RINGERS IV SOLN: INTRAVENOUS | Status: DC

## 2021-08-24 MED ORDER — ONDANSETRON HCL 4 MG/2ML IJ SOLN: 4.0000 mg | Freq: Four times a day (QID) | INTRAMUSCULAR | Status: DC | PRN

## 2021-08-24 MED ORDER — HYDROCORTISONE 1 % EX CREA
TOPICAL_CREAM | Freq: Four times a day (QID) | CUTANEOUS | Status: DC
  Filled 2021-08-24: qty 28

## 2021-08-24 NOTE — Progress Notes (Signed)
  Progress Note    [name] Munoz   SPQ:[id number]  DOB: [birth year]  DOA: 08/23/2021     1 Date of Service: 08/24/2021   Clinical Course 75 y.o. female with medical history significant of hypertension, diabetes, depression, GERD, coronary artery disease with recent intractable nausea vomiting due to UTI admitted for generalized malaise and fatigue. In ED, she was found to be hypothermic, hypotensive and COVID +.   10/26 - sugars running high, added Levemir, novolog for meal coverage and SSI   Assessment and Plan * COVID-19 virus infection Continue remdesivir and steroids as patient seem to have GI symptoms.  GERD (gastroesophageal reflux disease) Continue PPI  Diabetes (HCC) Add Levemir and novolog along with SSI for better sugar control. A1c 7.8  Hypotension Due to dehydration, improving now with hydration.  Acute renal failure (ARF) (HCC) Due to dehydration from poor PO intake, continue IVF and encourage Oral nutrition.     Subjective:  Not feeling too well. Sugars running high.  Objective Vitals:   08/24/21 1600 08/24/21 1703 08/24/21 1800 08/24/21 2000  BP: (!) 127/48 (!) 111/49 110/69 (!) 114/95  Pulse: 78 76 77 86  Resp: 18 17 16 18   Temp: 98.4 F (36.9 C)     TempSrc: Oral     SpO2: 98% 98% 98% 97%  Weight:      Height:       98.9 kg  Vital signs were reviewed and unremarkable.   Exam Physical Exam   Eyes: PERRL, lids and conjunctivae normal ENMT: Mucous membranes are dry. Posterior pharynx clear of any exudate or lesions.Normal dentition.  Neck: normal, supple, no masses, no thyromegaly Respiratory: clear to auscultation bilaterally, no wheezing, no crackles. Normal respiratory effort. No accessory muscle use.  Cardiovascular: Regular rate and rhythm, no murmurs / rubs / gallops. No extremity edema. 2+ pedal pulses. No carotid bruits.  Abdomen: no tenderness, no masses palpated. No hepatosplenomegaly. Bowel sounds positive.  Musculoskeletal: no  clubbing / cyanosis. No joint deformity upper and lower extremities. Good ROM, no contractures. Normal muscle tone.  Skin: no rashes, lesions, ulcers. No induration Neurologic: CN 2-12 grossly intact. Sensation intact, DTR normal. Strength 5/5 in all 4.  Psychiatric: normal mood and affect  Labs / Other Information My review of labs, imaging, notes and other tests is significant for hyperkalemia and elevated creatinine     Disposition Plan: Status is: Inpatient  Remains inpatient appropriate because: nausea, vomiting, elevated sugars and AKI                 Time spent: 35 minutes [facility] 08/24/2021, 9:32 PM

## 2021-08-24 NOTE — Assessment & Plan Note (Signed)
Due to dehydration, improving now with hydration.

## 2021-08-24 NOTE — Progress Notes (Signed)
Anticoagulation monitoring(Lovenox):  75 yo female ordered Lovenox 40 mg Q24h    Filed Weights   08/23/21 2101  Weight: 98.9 kg (218 lb)   BMI 32  Lab Results  Component Value Date   CREATININE 2.08 (H) 08/23/2021   CREATININE 1.09 (H) 12/19/2020   CREATININE 1.20 (H) 05/18/2019   Estimated Creatinine Clearance: 30.2 mL/min (A) (by C-G formula based on SCr of 2.08 mg/dL (H)). Hemoglobin & Hematocrit     Component Value Date/Time   HGB 14.7 08/23/2021 2041   HGB 14.4 11/02/2014 0116   HCT 44.2 08/23/2021 2041   HCT 44.7 11/02/2014 0116     Per Protocol for Patient with estCrcl > 30 ml/min and BMI > 30, will transition to Lovenox 50 mg Q24h.

## 2021-08-24 NOTE — Assessment & Plan Note (Signed)
Continue remdesivir and steroids as patient seem to have GI symptoms.

## 2021-08-24 NOTE — ED Notes (Signed)
Pt provided a dinner tray from dietary.  

## 2021-08-24 NOTE — ED Notes (Signed)
I & O cath done, 700 ml of yellow cloudy urine drained from pt, pt states "I feel so much better why couldn't they have done this earlier"    Pt c/o of hemorid pain, MD placed orders for hydrocortisone cream which will be applied when received.

## 2021-08-24 NOTE — Assessment & Plan Note (Signed)
Add Levemir and novolog along with SSI for better sugar control. A1c 7.8

## 2021-08-24 NOTE — ED Notes (Addendum)
ERMD at bedside at this time  -wrong entry

## 2021-08-24 NOTE — ED Notes (Signed)
Jacque RN aware of assigned bed ?

## 2021-08-24 NOTE — ED Notes (Signed)
Dr. Sherryll Burger made aware of the patients elevated CBG.

## 2021-08-24 NOTE — Assessment & Plan Note (Signed)
Continue PPI ?

## 2021-08-24 NOTE — Progress Notes (Signed)
[facility] followed by [facility].  CSW spoke with Denisha RN w/ [facility] [phone] and gave her update on patient status.  Denisha requested update if anything changes for patient.

## 2021-08-24 NOTE — ED Notes (Signed)
Pt resting comfortably at this time. NAD noted. Normal rise and fall of chest. Call bell in reach.  

## 2021-08-24 NOTE — ED Notes (Addendum)
Pt provided non slip socks and ambulated to the [facility] with standby assist from this RN and had a large BM, tyope 4 on the BSC.

## 2021-08-24 NOTE — Assessment & Plan Note (Signed)
Due to dehydration from poor PO intake, continue IVF and encourage Oral nutrition.

## 2021-08-24 NOTE — Hospital Course (Signed)
75 y.o. female with medical history significant of hypertension, diabetes, depression, GERD, coronary artery disease with recent intractable nausea vomiting due to UTI admitted for generalized malaise and fatigue. In ED, she was found to be hypothermic, hypotensive and COVID +.   10/26 - sugars running high, added Levemir, novolog for meal coverage and SSI

## 2021-08-24 NOTE — Progress Notes (Signed)
Inpatient Diabetes Program Recommendations  AACE/ADA: New Consensus Statement on Inpatient Glycemic Control   Target Ranges:  Prepandial:   less than 140 mg/dL      Peak postprandial:   less than 180 mg/dL (1-2 hours)      Critically ill patients:  140 - 180 mg/dL  Results for COTY, STUDENT (MRN [id number]) as of 08/24/2021 13:13  Ref. Range 08/24/2021 08:58  Glucose Latest Ref Range: 70 - 99 mg/dL 818 (H)   Results for KALEENA, CORROW (MRN [id number]) as of 08/24/2021 13:13  Ref. Range 08/23/2021 20:44 08/23/2021 22:15 08/23/2021 23:54  Glucose-Capillary Latest Ref Range: 70 - 99 mg/dL 69 (L) 789 (H) 381 (H)    Review of Glycemic Control  Diabetes history: DM2 Outpatient Diabetes medications: Levemir 18 units daily, Actos 45 mg daily, Januvia 50 mg daily, Ozempic 0.5 mg Qweek Current orders for Inpatient glycemic control: None; Decadron 6 mg Q24H  Inpatient Diabetes Program Recommendations:    Insulin: Please consider ordering CBGs AC&HS, Novolog 0-15 units AC&HS, and Levemir 10 units Q24H to start now.  Thanks, Orlando Penner, RN, MSN, CDE Diabetes Coordinator Inpatient Diabetes Program [phone] (Team Pager from 8am to 5pm)

## 2021-08-25 DIAGNOSIS — I10 Essential (primary) hypertension: Secondary | ICD-10-CM

## 2021-08-25 LAB — COMPREHENSIVE METABOLIC PANEL
ALT: 8 U/L (ref 0–44)
AST: 11 U/L — ABNORMAL LOW (ref 15–41)
Albumin: 3 g/dL — ABNORMAL LOW (ref 3.5–5.0)
Alkaline Phosphatase: 62 U/L (ref 38–126)
Anion gap: 9 (ref 5–15)
BUN: 31 mg/dL — ABNORMAL HIGH (ref 8–23)
CO2: 25 mmol/L (ref 22–32)
Calcium: 8.9 mg/dL (ref 8.9–10.3)
Chloride: 104 mmol/L (ref 98–111)
Creatinine, Ser: 1.49 mg/dL — ABNORMAL HIGH (ref 0.44–1.00)
GFR, Estimated: 37 mL/min — ABNORMAL LOW (ref >60)
Glucose, Bld: 264 mg/dL — ABNORMAL HIGH (ref 70–99)
Potassium: 4.9 mmol/L (ref 3.5–5.1)
Sodium: 138 mmol/L (ref 135–145)
Total Bilirubin: 0.8 mg/dL (ref 0.3–1.2)
Total Protein: 5.8 g/dL — ABNORMAL LOW (ref 6.5–8.1)

## 2021-08-25 LAB — CBG MONITORING, ED: Glucose-Capillary: 278 mg/dL — ABNORMAL HIGH (ref 70–99)

## 2021-08-25 LAB — CBC WITH DIFFERENTIAL/PLATELET
Abs Immature Granulocytes: 0.02 10*3/uL (ref 0.00–0.07)
Basophils Absolute: 0 10*3/uL (ref 0.0–0.1)
Basophils Relative: 0 %
Eosinophils Absolute: 0 10*3/uL (ref 0.0–0.5)
Eosinophils Relative: 0 %
HCT: 37.7 % (ref 36.0–46.0)
Hemoglobin: 12.5 g/dL (ref 12.0–15.0)
Immature Granulocytes: 0 %
Lymphocytes Relative: 10 %
Lymphs Abs: 0.6 10*3/uL — ABNORMAL LOW (ref 0.7–4.0)
MCH: 29.5 pg (ref 26.0–34.0)
MCHC: 33.2 g/dL (ref 30.0–36.0)
MCV: 88.9 fL (ref 80.0–100.0)
Monocytes Absolute: 0.2 10*3/uL (ref 0.1–1.0)
Monocytes Relative: 3 %
Neutro Abs: 5.3 10*3/uL (ref 1.7–7.7)
Neutrophils Relative %: 87 %
Platelets: 124 10*3/uL — ABNORMAL LOW (ref 150–400)
RBC: 4.24 MIL/uL (ref 3.87–5.11)
RDW: 13.7 % (ref 11.5–15.5)
WBC: 6.1 10*3/uL (ref 4.0–10.5)
nRBC: 0 % (ref 0.0–0.2)

## 2021-08-25 LAB — D-DIMER, QUANTITATIVE: D-Dimer, Quant: 5 ug/mL-FEU — ABNORMAL HIGH (ref 0.00–0.50)

## 2021-08-25 LAB — MAGNESIUM: Magnesium: 1.8 mg/dL (ref 1.7–2.4)

## 2021-08-25 LAB — C-REACTIVE PROTEIN: CRP: 3.3 mg/dL — ABNORMAL HIGH (ref <1.0)

## 2021-08-25 LAB — FERRITIN: Ferritin: 35 ng/mL (ref 11–307)

## 2021-08-25 LAB — PHOSPHORUS: Phosphorus: 3.2 mg/dL (ref 2.5–4.6)

## 2021-08-25 NOTE — ED Notes (Signed)
Patient provided with breakfast tray, updated about plan of care.

## 2021-08-25 NOTE — ED Notes (Signed)
This RN spoke with [facility] RN Denisha about pt being d/c, [facility] will transport pt home for safe discharge. [facility] RN will notify this RN about updated ETA.

## 2021-08-25 NOTE — ED Notes (Signed)
[facility] RN notified this RN about transportation between 10-10:30am

## 2021-08-28 LAB — CULTURE, BLOOD (ROUTINE X 2)
Culture: NO GROWTH
Special Requests: ADEQUATE

## 2021-08-29 LAB — CULTURE, BLOOD (ROUTINE X 2): Culture: NO GROWTH

## 2021-09-05 NOTE — Discharge Summary (Signed)
Physician Discharge Summary   Patient name: [name] Munoz  Admit date:     08/23/2021  Discharge date: 08/25/2021  Discharge Physician: Delfino Lovett   PCP: Delton Prairie, FNP   Recommendations at discharge: f/up with PCP as requested  Discharge Diagnoses Principal Problem:   COVID-19 virus infection Active Problems:   Acute renal failure (ARF) (HCC)   Hypotension   CAD (coronary artery disease)   Diabetes (HCC)   GERD (gastroesophageal reflux disease)   Essential hypertension   Hospital Course   75 y.o. female with medical history significant of hypertension, diabetes, depression, GERD, coronary artery disease with recent intractable nausea vomiting due to UTI admitted for generalized malaise and fatigue. In ED, she was found to be hypothermic, hypotensive and COVID +.   10/26 - sugars running high, added Levemir, novolog for meal coverage and SSI  * COVID-19 virus infection Treated with remdesivir and steroids as patient had GI symptoms which improved with treatment.   GERD (gastroesophageal reflux disease) Continue PPI   Diabetes (HCC) Treated with sliding scale while in the hospital along with long acting and meal coverage. Counseled on better sugar control as A1c 7.8   Hypotension Due to dehydration, resolved with hydration.   Acute renal failure (ARF) (HCC) Due to dehydration from poor PO intake, improved with IVF and encouraged Oral nutrition.      Subjective:  Not feeling too well. Sugars running high.   Objective       Vitals:    08/24/21 1600 08/24/21 1703 08/24/21 1800 08/24/21 2000  BP: (!) 127/48 (!) 111/49 110/69 (!) 114/95  Pulse: 78 76 77 86  Resp: 18 17 16 18   Temp: 98.4 F (36.9 C)          Condition at discharge: good  Exam Physical Exam   Eyes: PERRL, lids and conjunctivae normal ENMT: Posterior pharynx clear of any exudate or lesions.Normal dentition.  Neck: normal, supple, no masses, no thyromegaly Respiratory: clear to  auscultation bilaterally, no wheezing, no crackles. Normal respiratory effort. No accessory muscle use.  Cardiovascular: Regular rate and rhythm, no murmurs / rubs / gallops. No extremity edema. 2+ pedal pulses. No carotid bruits.  Abdomen: no tenderness, no masses palpated. No hepatosplenomegaly. Bowel sounds positive.  Musculoskeletal: no clubbing / cyanosis. No joint deformity upper and lower extremities. Good ROM, no contractures. Normal muscle tone.  Skin: no rashes, lesions, ulcers. No induration Neurologic: CN 2-12 grossly intact. Sensation intact, DTR normal. Strength 5/5 in all 4.  Psychiatric: normal mood and affect  Disposition: Home  Discharge time: greater than 30 minutes.  Follow-up Information     , FNP. Schedule an appointment as soon as possible for a visit in 1 week(s).   Specialty: Nurse Practitioner Why: [facility] Discharge F/UP Contact information: [address] [place] [phone]                 Allergies as of 08/25/2021       Reactions   Erythromycin    Lamisil [terbinafine Hcl]    Tramadol Hcl    Penicillins         Medication List     TAKE these medications    acetaminophen 325 MG tablet Commonly known as: TYLENOL Take 650 mg by mouth every 6 (six) hours as needed for mild pain.   aspirin EC 81 MG tablet Take 81 mg by mouth daily.   atorvastatin 40 MG tablet Commonly known as: LIPITOR Take 40 mg  by mouth daily.   buPROPion 300 MG 24 hr tablet Commonly known as: WELLBUTRIN XL Take 300 mg by mouth daily.   cholecalciferol 25 MCG (1000 UNIT) tablet Commonly known as: VITAMIN D Take 2,000 Units by mouth daily.   citalopram 40 MG tablet Commonly known as: CELEXA Take 40 mg by mouth daily.   insulin detemir 100 UNIT/ML injection Commonly known as: LEVEMIR Inject 18 Units into the skin daily.   lisinopril 10 MG tablet Commonly known as: ZESTRIL Take 10 mg by mouth daily.   magnesium  hydroxide 400 MG/5ML suspension Commonly known as: MILK OF MAGNESIA Take 6 mLs by mouth 2 (two) times daily as needed for constipation.   metoprolol succinate 25 MG 24 hr tablet Commonly known as: TOPROL-XL Take 25 mg by mouth daily.   ondansetron 4 MG tablet Commonly known as: ZOFRAN Take 4 mg by mouth every 8 (eight) hours as needed for nausea or vomiting.   Ozempic (0.25 or 0.5 MG/DOSE) 2 MG/1.5ML Sopn Generic drug: Semaglutide(0.25 or 0.5MG /DOS) Inject into the skin once a week.   pantoprazole 40 MG tablet Commonly known as: PROTONIX Take 40 mg by mouth daily.   pioglitazone 45 MG tablet Commonly known as: ACTOS Take 1 tablet (45 mg total) by mouth daily.   polyethylene glycol 17 g packet Commonly known as: MIRALAX / GLYCOLAX Take 17 g by mouth daily.   pregabalin 75 MG capsule Commonly known as: LYRICA Take 75 mg by mouth 2 (two) times daily.   sitaGLIPtin 50 MG tablet Commonly known as: JANUVIA Take 50 mg by mouth daily.   vitamin B-12 1000 MCG tablet Commonly known as: CYANOCOBALAMIN Take 1,000 mcg by mouth every 30 (thirty) days.        CT ABDOMEN PELVIS WO CONTRAST  Result Date: 08/23/2021 CLINICAL DATA:  Abdominal abscess/infection suspected. EXAM: CT ABDOMEN AND PELVIS WITHOUT CONTRAST TECHNIQUE: Multidetector CT imaging of the abdomen and pelvis was performed following the standard protocol without IV contrast. COMPARISON:  CT abdomen pelvis dated 12/19/2020. FINDINGS: Evaluation of this exam is limited in the absence of intravenous contrast. Lower chest: The visualized lung bases are clear. Coronary vascular calcifications noted. No intra-abdominal free air or free fluid. Hepatobiliary: The liver is unremarkable. No intrahepatic biliary ductal dilatation. Layering sludge or small stones. No pericholecystic fluid or evidence of acute cholecystitis by CT. Pancreas: Unremarkable. No pancreatic ductal dilatation or surrounding inflammatory changes. Spleen:  Normal in size without focal abnormality. Adrenals/Urinary Tract: Left adrenal thickening or adenoma measuring up to 17 mm. The right adrenal gland is unremarkable. There is no hydronephrosis or nephrolithiasis on either side. Left renal cysts measure up to 3 cm in the upper pole. The visualized ureters and urinary bladder appear unremarkable. Small amount of air within the urinary bladder may be introduced via recent instrumentation. Stomach/Bowel: Moderate stool throughout the colon. There is a small hiatal hernia. Several normal caliber fluid-filled loops of small bowel with edema and stranding of the associated mesentery most consistent with enteritis. Clinical correlation is recommended. No bowel obstruction. The appendix is normal. There is sigmoid diverticulosis without active inflammatory changes. Vascular/Lymphatic: Moderate aortoiliac atherosclerotic disease. The IVC is unremarkable. No portal gas. There is no adenopathy. Reproductive: The uterus is grossly unremarkable. No adnexal masses. Other: None Musculoskeletal: No acute or significant osseous findings. IMPRESSION: 1. Enteritis. No bowel obstruction. Normal appendix. 2. Sigmoid diverticulosis. 3. No hydronephrosis or nephrolithiasis. 4. Aortic Atherosclerosis (ICD10-I70.0). Electronically Signed   By: Elgie Collard M.D.   On:  08/23/2021 22:01   CT HEAD WO CONTRAST ( )  Result Date: 08/23/2021 CLINICAL DATA:  Mental status change, unknown cause EXAM: CT HEAD WITHOUT CONTRAST TECHNIQUE: Contiguous axial images were obtained from the base of the skull through the vertex without intravenous contrast. COMPARISON:  04/04/2017 FINDINGS: Brain: There is atrophy and chronic small vessel disease changes. No acute intracranial abnormality. Specifically, no hemorrhage, hydrocephalus, mass lesion, acute infarction, or significant intracranial injury. Vascular: No hyperdense vessel or unexpected calcification. Skull: No acute calvarial abnormality.  Sinuses/Orbits: Old right medial orbital wall blowout fracture. No air-fluid levels. Other: None IMPRESSION: Atrophy, chronic microvascular disease. No acute intracranial abnormality. Electronically Signed   By: Charlett Nose M.D.   On: 08/23/2021 21:59   DG Chest Port 1 View  Result Date: 08/23/2021 CLINICAL DATA:  Sepsis EXAM: PORTABLE CHEST 1 VIEW COMPARISON:  05/15/2019 FINDINGS: Heart and mediastinal contours are within normal limits. No focal opacities or effusions. No acute bony abnormality. IMPRESSION: No active disease. Electronically Signed   By: Charlett Nose M.D.   On: 08/23/2021 21:14   Results for orders placed or performed during the hospital encounter of 08/23/21  Blood Culture (routine x 2)     Status: None   Collection Time: 08/23/21  8:41 PM   Specimen: BLOOD  Result Value Ref Range Status   Specimen Description BLOOD BLOOD LEFT HAND  Final   Special Requests   Final    BOTTLES DRAWN AEROBIC AND ANAEROBIC Blood Culture adequate volume   Culture   Final    NO GROWTH 5 DAYS Performed at [facility], [address] [place], [place] [address]    Report Status 08/28/2021 FINAL  Final  Resp Panel by RT-PCR (Flu A&B, Covid) Nasopharyngeal Swab     Status: Abnormal   Collection Time: 08/23/21  8:42 PM   Specimen: Nasopharyngeal Swab; Nasopharyngeal(NP) swabs in vial transport medium  Result Value Ref Range Status   SARS Coronavirus 2 by RT PCR POSITIVE (A) NEGATIVE Final    Comment: RESULT CALLED TO, READ BACK BY AND VERIFIED WITH: JOANA CEBALLOS @2153  ON 08/23/21 SKL (NOTE) SARS-CoV-2 target nucleic acids are DETECTED.  The SARS-CoV-2 RNA is generally detectable in upper respiratory specimens during the acute phase of infection. Positive results are indicative of the presence of the identified virus, but do not rule out bacterial infection or co-infection with other pathogens not detected by the test. Clinical correlation with patient history and other  diagnostic information is necessary to determine patient infection status. The expected result is Negative.  Fact Sheet for Patients: 08/25/21  Fact Sheet for Healthcare Providers: BloggerCourse.com  This test is not yet approved or cleared by the [employer] FDA and  has been authorized for detection and/or diagnosis of SARS-CoV-2 by FDA under an Emergency Use Authorization (EUA).  This EUA will remain in effect (meaning this test can  be used) for the duration of  the COVID-19 declaration under Section 564(b)(1) of the Act, 21 U.S.C. section 360bbb-3(b)(1), unless the authorization is terminated or revoked sooner.     Influenza A by PCR NEGATIVE NEGATIVE Final   Influenza B by PCR NEGATIVE NEGATIVE Final    Comment: (NOTE) The Xpert Xpress SARS-CoV-2/FLU/RSV plus assay is intended as an aid in the diagnosis of influenza from Nasopharyngeal swab specimens and should not be used as a sole basis for treatment. Nasal washings and aspirates are unacceptable for Xpert Xpress SARS-CoV-2/FLU/RSV testing.  Fact Sheet for Patients: [place]  Fact Sheet for Healthcare Providers:  [employer]ious[employer]ker[employer]it  This test is not yet approved or cleared by the [place] and has been authorized for detection and/or diagnosis of SARS-CoV-2 by FDA under an Emergency Use Authorization (EUA). This EUA will remain in effect (meaning this test can be used) for the duration of the COVID-19 declaration under Section 564(b)(1) of the Act, 21 U.S.C. section 360bbb-3(b)(1), unless the authorization is terminated or revoked.  Performed at [facility], [address] [place], [place] [address]   Blood Culture (routine x 2)     Status: None   Collection Time: 08/23/21 11:41 PM   Specimen: BLOOD  Result Value Ref Range Status   Specimen Description BLOOD RIGHT ARM  Final    Special Requests   Final    BOTTLES DRAWN AEROBIC AND ANAEROBIC Blood Culture results may not be optimal due to an inadequate volume of blood received in culture bottles   Culture   Final    NO GROWTH 5 DAYS Performed at [facility], [address], [place], [place] [address]    Report Status 08/29/2021 FINAL  Final    Signed:  Delfino Lovett MD.  [facility] 09/05/2021, 1:29 PM

## 2022-06-07 ENCOUNTER — Emergency Department: Payer: Medicare (Managed Care)

## 2022-06-07 ENCOUNTER — Other Ambulatory Visit: Payer: Self-pay

## 2022-06-07 ENCOUNTER — Observation Stay
Admission: EM | Admit: 2022-06-07 | Discharge: 2022-06-09 | Disposition: A | Discharge status: Skilled Nursing Facility | Payer: Medicare (Managed Care) | Attending: Internal Medicine | Admitting: Internal Medicine

## 2022-06-07 DIAGNOSIS — Z7984 Long term (current) use of oral hypoglycemic drugs: Secondary | ICD-10-CM | POA: Diagnosis not present

## 2022-06-07 DIAGNOSIS — E669 Obesity, unspecified: Secondary | ICD-10-CM | POA: Diagnosis not present

## 2022-06-07 DIAGNOSIS — S72416A Nondisplaced unspecified condyle fracture of lower end of unspecified femur, initial encounter for closed fracture: Secondary | ICD-10-CM | POA: Insufficient documentation

## 2022-06-07 DIAGNOSIS — I129 Hypertensive chronic kidney disease with stage 1 through stage 4 chronic kidney disease, or unspecified chronic kidney disease: Secondary | ICD-10-CM | POA: Insufficient documentation

## 2022-06-07 DIAGNOSIS — E1129 Type 2 diabetes mellitus with other diabetic kidney complication: Secondary | ICD-10-CM | POA: Diagnosis present

## 2022-06-07 DIAGNOSIS — S72414A Nondisplaced unspecified condyle fracture of lower end of right femur, initial encounter for closed fracture: Secondary | ICD-10-CM | POA: Diagnosis not present

## 2022-06-07 DIAGNOSIS — Y92099 Unspecified place in other non-institutional residence as the place of occurrence of the external cause: Secondary | ICD-10-CM | POA: Diagnosis not present

## 2022-06-07 DIAGNOSIS — N1832 Chronic kidney disease, stage 3b: Secondary | ICD-10-CM | POA: Diagnosis present

## 2022-06-07 DIAGNOSIS — M6281 Muscle weakness (generalized): Secondary | ICD-10-CM | POA: Diagnosis not present

## 2022-06-07 DIAGNOSIS — Z9181 History of falling: Secondary | ICD-10-CM | POA: Diagnosis not present

## 2022-06-07 DIAGNOSIS — Z79899 Other long term (current) drug therapy: Secondary | ICD-10-CM | POA: Diagnosis not present

## 2022-06-07 DIAGNOSIS — I2510 Atherosclerotic heart disease of native coronary artery without angina pectoris: Secondary | ICD-10-CM | POA: Diagnosis not present

## 2022-06-07 DIAGNOSIS — Z7982 Long term (current) use of aspirin: Secondary | ICD-10-CM | POA: Diagnosis not present

## 2022-06-07 DIAGNOSIS — W108XXA Fall (on) (from) other stairs and steps, initial encounter: Secondary | ICD-10-CM | POA: Diagnosis not present

## 2022-06-07 DIAGNOSIS — R2681 Unsteadiness on feet: Secondary | ICD-10-CM | POA: Diagnosis not present

## 2022-06-07 DIAGNOSIS — I10 Essential (primary) hypertension: Secondary | ICD-10-CM | POA: Diagnosis present

## 2022-06-07 DIAGNOSIS — S72424A Nondisplaced fracture of lateral condyle of right femur, initial encounter for closed fracture: Principal | ICD-10-CM | POA: Diagnosis present

## 2022-06-07 DIAGNOSIS — Y92009 Unspecified place in unspecified non-institutional (private) residence as the place of occurrence of the external cause: Secondary | ICD-10-CM

## 2022-06-07 DIAGNOSIS — Z6832 Body mass index (BMI) 32.0-32.9, adult: Secondary | ICD-10-CM | POA: Diagnosis not present

## 2022-06-07 DIAGNOSIS — W19XXXA Unspecified fall, initial encounter: Secondary | ICD-10-CM | POA: Diagnosis not present

## 2022-06-07 DIAGNOSIS — S0990XA Unspecified injury of head, initial encounter: Secondary | ICD-10-CM | POA: Diagnosis not present

## 2022-06-07 DIAGNOSIS — D696 Thrombocytopenia, unspecified: Secondary | ICD-10-CM | POA: Diagnosis present

## 2022-06-07 DIAGNOSIS — E1122 Type 2 diabetes mellitus with diabetic chronic kidney disease: Secondary | ICD-10-CM | POA: Insufficient documentation

## 2022-06-07 DIAGNOSIS — S8991XA Unspecified injury of right lower leg, initial encounter: Secondary | ICD-10-CM | POA: Diagnosis present

## 2022-06-07 DIAGNOSIS — Z794 Long term (current) use of insulin: Secondary | ICD-10-CM | POA: Insufficient documentation

## 2022-06-07 DIAGNOSIS — E785 Hyperlipidemia, unspecified: Secondary | ICD-10-CM | POA: Diagnosis present

## 2022-06-07 DIAGNOSIS — F32A Depression, unspecified: Secondary | ICD-10-CM | POA: Diagnosis present

## 2022-06-07 LAB — CBC
HCT: 38.9 % (ref 36.0–46.0)
Hemoglobin: 12.2 g/dL (ref 12.0–15.0)
MCH: 29.1 pg (ref 26.0–34.0)
MCHC: 31.4 g/dL (ref 30.0–36.0)
MCV: 92.8 fL (ref 80.0–100.0)
Platelets: 130 10*3/uL — ABNORMAL LOW (ref 150–400)
RBC: 4.19 MIL/uL (ref 3.87–5.11)
RDW: 14.1 % (ref 11.5–15.5)
WBC: 6 10*3/uL (ref 4.0–10.5)
nRBC: 0 % (ref 0.0–0.2)

## 2022-06-07 LAB — COMPREHENSIVE METABOLIC PANEL
ALT: 12 U/L (ref 0–44)
AST: 16 U/L (ref 15–41)
Albumin: 3.6 g/dL (ref 3.5–5.0)
Alkaline Phosphatase: 67 U/L (ref 38–126)
Anion gap: 6 (ref 5–15)
BUN: 22 mg/dL (ref 8–23)
CO2: 27 mmol/L (ref 22–32)
Calcium: 9.4 mg/dL (ref 8.9–10.3)
Chloride: 108 mmol/L (ref 98–111)
Creatinine, Ser: 1.26 mg/dL — ABNORMAL HIGH (ref 0.44–1.00)
GFR, Estimated: 45 mL/min — ABNORMAL LOW (ref >60)
Glucose, Bld: 183 mg/dL — ABNORMAL HIGH (ref 70–99)
Potassium: 4.4 mmol/L (ref 3.5–5.1)
Sodium: 141 mmol/L (ref 135–145)
Total Bilirubin: 1.1 mg/dL (ref 0.3–1.2)
Total Protein: 6.5 g/dL (ref 6.5–8.1)

## 2022-06-07 LAB — PROTIME-INR
INR: 1 (ref 0.8–1.2)
Prothrombin Time: 13.3 seconds (ref 11.4–15.2)

## 2022-06-07 LAB — URINALYSIS, COMPLETE (UACMP) WITH MICROSCOPIC
Bacteria, UA: NONE SEEN
Bilirubin Urine: NEGATIVE
Glucose, UA: NEGATIVE mg/dL
Hgb urine dipstick: NEGATIVE
Ketones, ur: NEGATIVE mg/dL
Leukocytes,Ua: NEGATIVE
Nitrite: NEGATIVE
Protein, ur: NEGATIVE mg/dL
Specific Gravity, Urine: 1.012 (ref 1.005–1.030)
pH: 8 (ref 5.0–8.0)

## 2022-06-07 LAB — GLUCOSE, CAPILLARY
Glucose-Capillary: 87 mg/dL (ref 70–99)
Glucose-Capillary: 141 mg/dL — ABNORMAL HIGH (ref 70–99)

## 2022-06-07 MED ORDER — FENTANYL CITRATE PF 50 MCG/ML IJ SOSY
50.0000 ug | PREFILLED_SYRINGE | Freq: Once | INTRAMUSCULAR | Status: AC
  Administered 2022-06-07: 50 ug via INTRAVENOUS
  Filled 2022-06-07: qty 1

## 2022-06-07 MED ORDER — PANTOPRAZOLE SODIUM 40 MG PO TBEC
40.0000 mg | DELAYED_RELEASE_TABLET | Freq: Every day | ORAL | Status: DC
  Administered 2022-06-08 – 2022-06-09 (×2): 40 mg via ORAL
  Filled 2022-06-07 (×2): qty 1

## 2022-06-07 MED ORDER — ENOXAPARIN SODIUM 40 MG/0.4ML IJ SOSY
40.0000 mg | PREFILLED_SYRINGE | INTRAMUSCULAR | Status: DC
  Administered 2022-06-07 – 2022-06-08 (×2): 40 mg via SUBCUTANEOUS
  Filled 2022-06-07 (×2): qty 0.4

## 2022-06-07 MED ORDER — ASPIRIN 81 MG PO TBEC
81.0000 mg | DELAYED_RELEASE_TABLET | Freq: Every day | ORAL | Status: DC
  Administered 2022-06-08 – 2022-06-09 (×2): 81 mg via ORAL
  Filled 2022-06-07 (×2): qty 1

## 2022-06-07 MED ORDER — MAGNESIUM HYDROXIDE 400 MG/5ML PO SUSP: 6.0000 mL | Freq: Two times a day (BID) | ORAL | Status: DC | PRN

## 2022-06-07 MED ORDER — ACETAMINOPHEN 325 MG PO TABS: 650.0000 mg | ORAL_TABLET | Freq: Four times a day (QID) | ORAL | Status: DC | PRN

## 2022-06-07 MED ORDER — FENTANYL CITRATE PF 50 MCG/ML IJ SOSY: 25.0000 ug | PREFILLED_SYRINGE | INTRAMUSCULAR | Status: DC | PRN

## 2022-06-07 MED ORDER — METHOCARBAMOL 500 MG PO TABS
500.0000 mg | ORAL_TABLET | Freq: Three times a day (TID) | ORAL | Status: DC | PRN
  Administered 2022-06-08 – 2022-06-09 (×2): 500 mg via ORAL
  Filled 2022-06-07 (×3): qty 1

## 2022-06-07 MED ORDER — LISINOPRIL 10 MG PO TABS
10.0000 mg | ORAL_TABLET | Freq: Every day | ORAL | Status: DC
  Administered 2022-06-07 – 2022-06-09 (×3): 10 mg via ORAL
  Filled 2022-06-07 (×3): qty 1

## 2022-06-07 MED ORDER — ATORVASTATIN CALCIUM 20 MG PO TABS
40.0000 mg | ORAL_TABLET | Freq: Every day | ORAL | Status: DC
  Administered 2022-06-08 – 2022-06-09 (×2): 40 mg via ORAL
  Filled 2022-06-07 (×2): qty 2

## 2022-06-07 MED ORDER — NYSTATIN 100000 UNIT/GM EX POWD
Freq: Two times a day (BID) | CUTANEOUS | Status: DC
  Filled 2022-06-07: qty 15

## 2022-06-07 MED ORDER — SENNA 8.6 MG PO TABS
1.0000 | ORAL_TABLET | Freq: Every day | ORAL | Status: DC | PRN
Start: 2022-06-07 — End: 2022-06-09

## 2022-06-07 MED ORDER — BUPROPION HCL ER (XL) 150 MG PO TB24
300.0000 mg | ORAL_TABLET | Freq: Every day | ORAL | Status: DC
  Administered 2022-06-08 – 2022-06-09 (×2): 300 mg via ORAL
  Filled 2022-06-07 (×2): qty 2

## 2022-06-07 MED ORDER — LIDOCAINE 5 % EX PTCH
1.0000 | MEDICATED_PATCH | CUTANEOUS | Status: DC
  Administered 2022-06-08: 1 via TRANSDERMAL
  Filled 2022-06-07: qty 1

## 2022-06-07 MED ORDER — METOPROLOL SUCCINATE ER 25 MG PO TB24
25.0000 mg | ORAL_TABLET | Freq: Every day | ORAL | Status: DC
  Administered 2022-06-08 – 2022-06-09 (×2): 25 mg via ORAL
  Filled 2022-06-07 (×2): qty 1

## 2022-06-07 MED ORDER — OXYCODONE-ACETAMINOPHEN 5-325 MG PO TABS
1.0000 | ORAL_TABLET | ORAL | Status: DC | PRN
  Administered 2022-06-07 – 2022-06-08 (×3): 1 via ORAL
  Filled 2022-06-07 (×3): qty 1

## 2022-06-07 MED ORDER — ONDANSETRON HCL 4 MG/2ML IJ SOLN: 4.0000 mg | Freq: Three times a day (TID) | INTRAMUSCULAR | Status: DC | PRN

## 2022-06-07 MED ORDER — INSULIN ASPART 100 UNIT/ML IJ SOLN
0.0000 [IU] | Freq: Three times a day (TID) | INTRAMUSCULAR | Status: DC
  Administered 2022-06-08: 3 [IU] via SUBCUTANEOUS
  Administered 2022-06-08 – 2022-06-09 (×2): 2 [IU] via SUBCUTANEOUS
  Filled 2022-06-07 (×2): qty 1

## 2022-06-07 MED ORDER — INSULIN GLARGINE-YFGN 100 UNIT/ML ~~LOC~~ SOLN
12.0000 [IU] | Freq: Every day | SUBCUTANEOUS | Status: DC
  Administered 2022-06-08 – 2022-06-09 (×2): 12 [IU] via SUBCUTANEOUS
  Filled 2022-06-07 (×2): qty 0.12

## 2022-06-07 MED ORDER — INSULIN ASPART 100 UNIT/ML IJ SOLN: 0.0000 [IU] | Freq: Every day | INTRAMUSCULAR | Status: DC

## 2022-06-07 MED ORDER — HYDRALAZINE HCL 20 MG/ML IJ SOLN
5.0000 mg | INTRAMUSCULAR | Status: DC | PRN
Start: 2022-06-07 — End: 2022-06-09

## 2022-06-07 MED ORDER — VITAMIN D 25 MCG (1000 UNIT) PO TABS
2000.0000 [IU] | ORAL_TABLET | Freq: Every day | ORAL | Status: DC
  Administered 2022-06-08 – 2022-06-09 (×2): 2000 [IU] via ORAL
  Filled 2022-06-07 (×2): qty 2

## 2022-06-07 MED ORDER — ZINC OXIDE 40 % EX OINT
TOPICAL_OINTMENT | Freq: Two times a day (BID) | CUTANEOUS | Status: DC
  Filled 2022-06-07: qty 113

## 2022-06-07 NOTE — ED Notes (Signed)
Pt requests to "not be poked anymore", MD aware and states it is not needed at this time.

## 2022-06-07 NOTE — H&P (Signed)
History and Physical    [name] Munoz UMP:[id number] DOB: 02/14/1946 DOA: 06/07/2022  Referring MD/NP/PA:   PCP: Nani Skillern, FNP   Patient coming from:  The patient is coming from home.      Chief Complaint: fall and right knee pain  HPI: [name] Munoz is a 76 y.o. female with medical history significant of  with hypertension, hyperlipidemia, diabetes mellitus, GERD, depression, CAD, CKD-3B, who presents with fall, right knee pain.  Patient states that she accidentally tripped her steps and fell when she was walking in the hallway without using her walker at about 9:30 AM. She fell with face done with upper lip injury. She also injured her right knee, causing severe right knee pain, which is constant, sharp, nonradiating, aggravated by walking. She has a small skin abrasion in right wrist. Denies loss of consciousness.  Denies head or neck injury.  No headache or neck pain.  Patient does not have chest pain, cough, shortness breath.  No nausea, vomiting, diarrhea or abdominal pain.  No symptoms of UTI.  Data reviewed independently and ED Course: pt was found to have WBC 6.0, pending CMAP, temperature normal, blood pressure 129/42, heart rate 66, RR 10, oxygen saturation 100% on room air.  CT of head is negative for acute intracranial normalities.  Patient is placed on MedSurg bed for observation. ED physician discussed the case with Dr. Joice Lofts of Ortho.  CT-right knee: 1. Acute fracture of the lateral femoral condyle without significant displacement. 2. Prior ORIF of a lateral tibial plateau fracture without evidence of hardware complication or new fracture. 3. Moderate-sized knee joint lipohemarthrosis.  CT-maxillofacial image: 1.  No acute traumatic injury identified in the Face. 2. Chronic right lamina papyracea fracture.  EKG:  Not done in ED, will get one.     Review of Systems:   General: no fevers, chills, no body weight gain, fatigue HEENT: no blurry vision, hearing changes  or sore throat Respiratory: no dyspnea, coughing, wheezing CV: no chest pain, no palpitations GI: no nausea, vomiting, abdominal pain, diarrhea, constipation GU: no dysuria, burning on urination, increased urinary frequency, hematuria  Ext: no leg edema Neuro: no unilateral weakness, numbness, or tingling, no vision change or hearing loss. Has fall. Skin: no rash.  Has a small skin abrasion in the right wrist MSK: has right knee pain Heme: No easy bruising.  Travel history: No recent long distant travel.   Allergy:  Allergies  Allergen Reactions   Erythromycin    Lamisil [Terbinafine Hcl]    Tramadol Hcl    Penicillins     Past Medical History:  Diagnosis Date   Coronary artery disease    Depression    Diabetes mellitus without complication (HCC)    GERD (gastroesophageal reflux disease)    Hypertension     Past Surgical History:  Procedure Laterality Date   ESOPHAGOGASTRODUODENOSCOPY N/A 09/20/2015   Procedure: ESOPHAGOGASTRODUODENOSCOPY (EGD);  Surgeon: Scot Jun, MD;  Location: [facility] ENDOSCOPY;  Service: Endoscopy;  Laterality: N/A;   ORIF TIBIA PLATEAU Right 05/16/2019   Procedure: OPEN REDUCTION INTERNAL FIXATION (ORIF) TIBIAL PLATEAU;  Surgeon: Donato Heinz, MD;  Location: [facility] ORS;  Service: Orthopedics;  Laterality: Right;   PERCUTANEOUS CORONARY ROTOBLATOR INTERVENTION (PCI-R)      Social History:  reports that she has never smoked. She has never used smokeless tobacco. She reports that she does not drink alcohol and does not use drugs.  Family History:  Family History  Problem Relation Age of Onset  Diabetes Mellitus II Maternal Aunt    Diabetes Mellitus I Other      Prior to Admission medications   Medication Sig Start Date End Date Taking? Authorizing Provider  acetaminophen (TYLENOL) 325 MG tablet Take 650 mg by mouth every 6 (six) hours as needed for mild pain.    [provider]  aspirin EC 81 MG tablet Take 81 mg by mouth daily.     [provider]  atorvastatin (LIPITOR) 40 MG tablet Take 40 mg by mouth daily.    [provider]  buPROPion (WELLBUTRIN XL) 300 MG 24 hr tablet Take 300 mg by mouth daily.    [provider]  cholecalciferol (VITAMIN D) 25 MCG (1000 UNIT) tablet Take 2,000 Units by mouth daily.    [provider]  citalopram (CELEXA) 40 MG tablet Take 40 mg by mouth daily.    [provider]  insulin detemir (LEVEMIR) 100 UNIT/ML injection Inject 18 Units into the skin daily.    [provider]  lisinopril (ZESTRIL) 10 MG tablet Take 10 mg by mouth daily.    [provider]  magnesium hydroxide (MILK OF MAGNESIA) 400 MG/5ML suspension Take 6 mLs by mouth 2 (two) times daily as needed for constipation.    [provider]  metoprolol succinate (TOPROL-XL) 25 MG 24 hr tablet Take 25 mg by mouth daily.    [provider]  ondansetron (ZOFRAN) 4 MG tablet Take 4 mg by mouth every 8 (eight) hours as needed for nausea or vomiting.    [provider]  OZEMPIC, 0.25 OR 0.5 MG/DOSE, 2 MG/1.5ML SOPN Inject into the skin once a week.    [provider]  pantoprazole (PROTONIX) 40 MG tablet Take 40 mg by mouth daily.    [provider]  pioglitazone (ACTOS) 45 MG tablet Take 1 tablet (45 mg total) by mouth daily. 05/20/19   Lang Snow, NP  polyethylene glycol (MIRALAX / GLYCOLAX) packet Take 17 g by mouth daily.    [provider]  pregabalin (LYRICA) 75 MG capsule Take 75 mg by mouth 2 (two) times daily.     [provider]  sitaGLIPtin (JANUVIA) 50 MG tablet Take 50 mg by mouth daily.    [provider]  vitamin B-12 (CYANOCOBALAMIN) 1000 MCG tablet Take 1,000 mcg by mouth every 30 (thirty) days.    [provider]    Physical Exam: Vitals:   06/07/22 1438 06/07/22 1530 06/07/22 1720 06/07/22 1809  BP:  (!) 152/47 139/64   Pulse:  70 68   Resp:  12 16   Temp: 98.3  F (36.8 C)  98.6 F (37 C)   TempSrc: Oral     SpO2:  99% 100%   Weight:    99.8 kg  Height:    5\' 9"  (1.753 m)   General: Not in acute distress HEENT:       Eyes: PERRL, EOMI, no scleral icterus.       ENT: No discharge from the ears and nose, no pharynx injection, no tonsillar enlargement.        Neck: No JVD, no bruit, no mass felt. Heme: No neck lymph node enlargement. Cardiac: S1/S2, RRR, No murmurs, No gallops or rubs. Respiratory: No rales, wheezing, rhonchi or rubs. GI: Soft, nondistended, nontender, no rebound pain, no organomegaly, BS present. GU: No hematuria Ext: No pitting leg edema bilaterally. 1+DP/PT pulse bilaterally. Musculoskeletal: Has tenderness to the right knee without significant swelling Skin: No  rashes. Has a small skin abrasion to right wrist Neuro: Alert, oriented X3, cranial nerves II-XII grossly intact, moves all extremities Psych: Patient is not psychotic, no suicidal or hemocidal ideation.  Labs on Admission: I have personally reviewed following labs and imaging studies  CBC: Recent Labs  Lab 06/07/22 1019  WBC 6.0  HGB 12.2  HCT 38.9  MCV 92.8  PLT 130*   Basic Metabolic Panel: No results for input(s): "NA", "K", "CL", "CO2", "GLUCOSE", "BUN", "CREATININE", "CALCIUM", "MG", "PHOS" in the last 168 hours. GFR: CrCl cannot be calculated (Patient's most recent lab result is older than the maximum 21 days allowed.). Liver Function Tests: No results for input(s): "AST", "ALT", "ALKPHOS", "BILITOT", "PROT", "ALBUMIN" in the last 168 hours. No results for input(s): "LIPASE", "AMYLASE" in the last 168 hours. No results for input(s): "AMMONIA" in the last 168 hours. Coagulation Profile: No results for input(s): "INR", "PROTIME" in the last 168 hours. Cardiac Enzymes: No results for input(s): "CKTOTAL", "CKMB", "CKMBINDEX", "TROPONINI" in the last 168 hours. BNP (last 3 results) No results for input(s): "PROBNP" in the last 8760  hours. HbA1C: No results for input(s): "HGBA1C" in the last 72 hours. CBG: Recent Labs  Lab 06/07/22 1724  GLUCAP 87   Lipid Profile: No results for input(s): "CHOL", "HDL", "LDLCALC", "TRIG", "CHOLHDL", "LDLDIRECT" in the last 72 hours. Thyroid Function Tests: No results for input(s): "TSH", "T4TOTAL", "FREET4", "T3FREE", "THYROIDAB" in the last 72 hours. Anemia Panel: No results for input(s): "VITAMINB12", "FOLATE", "FERRITIN", "TIBC", "IRON", "RETICCTPCT" in the last 72 hours. Urine analysis:    Component Value Date/Time   COLORURINE YELLOW (A) 06/07/2022 1815   APPEARANCEUR HAZY (A) 06/07/2022 1815   APPEARANCEUR Cloudy 05/30/2013 2026   LABSPEC 1.012 06/07/2022 1815   LABSPEC 1.014 05/30/2013 2026   PHURINE 8.0 06/07/2022 1815   GLUCOSEU NEGATIVE 06/07/2022 1815   GLUCOSEU 50 mg/dL 93/71/6967 8938   HGBUR NEGATIVE 06/07/2022 1815   BILIRUBINUR NEGATIVE 06/07/2022 1815   BILIRUBINUR Negative 05/30/2013 2026   KETONESUR NEGATIVE 06/07/2022 1815   PROTEINUR NEGATIVE 06/07/2022 1815   NITRITE NEGATIVE 06/07/2022 1815   LEUKOCYTESUR NEGATIVE 06/07/2022 1815   LEUKOCYTESUR 3+ 05/30/2013 2026   Sepsis Labs: @LABRCNTIP (procalcitonin:4,lacticidven:4) )No results found for this or any previous visit (from the past 240 hour(s)).   Radiological Exams on Admission: CT Knee Right Wo Contrast  Result Date: 06/07/2022 CLINICAL DATA:  Fall, right knee pain.  Abnormal x-ray EXAM: CT OF THE RIGHT KNEE WITHOUT CONTRAST TECHNIQUE: Multidetector CT imaging of the right knee was performed according to the standard protocol. Multiplanar CT image reconstructions were also generated. RADIATION DOSE REDUCTION: This exam was performed according to the departmental dose-optimization program which includes automated exposure control, adjustment of the mA and/or kV according to patient size and/or use of iterative reconstruction technique. COMPARISON:  X-ray 06/07/2022, CT 05/15/2019 FINDINGS:  Bones/Joint/Cartilage Diffuse osseous demineralization. Acute fracture involving the posterior and lateral aspects of the lateral femoral condyle without significant displacement (series 4, image 105). Prior ORIF of a lateral tibial plateau fracture with sideplate and screw fixation construct. Hardware appears intact and well seated. No evidence of an acute fracture involving the proximal tibia. The proximal fibula and patella are intact without fracture. Moderate-sized knee joint lipohemarthrosis. Ligaments Suboptimally assessed by CT. Muscles and Tendons No acute musculotendinous abnormality by CT. Soft tissues No fluid collection or hematoma within the soft tissues. Superficial venous varicosities. Vascular atherosclerosis. IMPRESSION: 1. Acute fracture of the lateral femoral condyle without significant displacement. 2. Prior ORIF  of a lateral tibial plateau fracture without evidence of hardware complication or new fracture. 3. Moderate-sized knee joint lipohemarthrosis. Electronically Signed   By: Davina Poke D.O.   On: 06/07/2022 13:42   DG Knee Complete 4 Views Right  Result Date: 06/07/2022 CLINICAL DATA:  Fall.  Pain EXAM: RIGHT KNEE - COMPLETE 4+ VIEW COMPARISON:  CT right knee 05/15/2019. C-arm images right knee 6 05/16/2019 FINDINGS: Chronic fracture lateral tibial plateau with depression. This has been fixed with a lateral plate and multiple screws. Bone fragment just above the surgical screws could represent residual fracture which has not healed versus a new fracture. No recent x-rays of the area are available. In addition, there is a moderately large joint effusion. Possible fracture of the lateral femoral condyle. This could be acute or chronic. This is not seen on the prior study. There is soft tissue calcification the medial collateral ligament due to chronic injury. This was present on the prior CT. IMPRESSION: Chronic fracture lateral tibial plateau with prior surgical fixation. Possible  recurrent fracture lateral tibial plateau versus fracture fragments which are not healed. Correlate with interval imaging if available. Possible acute fracture of the lateral femoral condyle. There is a moderate joint effusion Consider CT of the right knee for further evaluation. Electronically Signed   By: Franchot Gallo M.D.   On: 06/07/2022 12:15   CT Maxillofacial Wo Contrast  Result Date: 06/07/2022 CLINICAL DATA:  76 year old female status post fall. Face contusions. EXAM: CT MAXILLOFACIAL WITHOUT CONTRAST TECHNIQUE: Multidetector CT imaging of the maxillofacial structures was performed. Multiplanar CT image reconstructions were also generated. RADIATION DOSE REDUCTION: This exam was performed according to the departmental dose-optimization program which includes automated exposure control, adjustment of the mA and/or kV according to patient size and/or use of iterative reconstruction technique. COMPARISON:  Head CT today. FINDINGS: Osseous: Absent dentition. Mandible intact and normally located. Maxilla appears intact. No zygoma, pterygoid, or nasal bone fracture. Central skull base appears intact. Visible cervical vertebrae grossly intact, with intermittent bilateral facet ankylosis. Orbits: Chronic right lamina papyracea fracture. Other orbital walls intact. Postoperative changes to both globes, but otherwise the orbits soft tissues appear normal. Sinuses: Hyperplastic and clear throughout. Soft tissues: No discrete superficial soft tissue injury. Partially retropharyngeal course of both carotids. Calcified carotid atherosclerosis. Otherwise negative visible noncontrast deep soft tissue spaces of the face and neck. Limited intracranial: Reported separately. IMPRESSION: 1.  No acute traumatic injury identified in the Face. 2. Chronic right lamina papyracea fracture. Electronically Signed   By: Genevie Ann M.D.   On: 06/07/2022 11:04   CT HEAD WO CONTRAST (5MM)  Result Date: 06/07/2022 CLINICAL DATA:   76 year old female status post fall. Face contusions. EXAM: CT HEAD WITHOUT CONTRAST TECHNIQUE: Contiguous axial images were obtained from the base of the skull through the vertex without intravenous contrast. RADIATION DOSE REDUCTION: This exam was performed according to the departmental dose-optimization program which includes automated exposure control, adjustment of the mA and/or kV according to patient size and/or use of iterative reconstruction technique. COMPARISON:  Head CT 08/23/2021.  Face CT today reported separately. FINDINGS: Brain: Cerebral volume is stable since last year, within normal limits for age. No midline shift, ventriculomegaly, mass effect, evidence of mass lesion, intracranial hemorrhage or evidence of cortically based acute infarction. Mild for age patchy bilateral white matter hypodensity is stable. Vascular: Calcified atherosclerosis at the skull base. No suspicious intracranial vascular hyperdensity. Skull: Chronic right lamina papyracea fracture. Facial bones detailed separately. No acute osseous abnormality  identified. Sinuses/Orbits: Visualized paranasal sinuses and mastoids are stable and well aerated. Other: No discrete orbit or scalp soft tissue injury. IMPRESSION: 1. No acute traumatic injury identified. Facial bones detailed separately. 2. Stable noncontrast CT appearance of the brain, mild for age chronic white matter changes. Electronically Signed   By: Genevie Ann M.D.   On: 06/07/2022 11:01      Assessment/Plan Principal Problem:   Closed nondisplaced fracture of lateral condyle of right femur (HCC) Active Problems:   Fall at home, initial encounter   Essential hypertension   CAD (coronary artery disease)   Type II diabetes mellitus with renal manifestations (HCC)   HLD (hyperlipidemia)   Depression   Chronic kidney disease, stage 3b (HCC)   Assessment and Plan: * Closed nondisplaced fracture of lateral condyle of right femur [facility]) ED physician discussed with  Dr. Roland Rack of Ortho, per Dr. Roland Rack of orthopedics, this is likely nonsurgical to place in a knee immobilizer with weightbearing as tolerated  -place in MedSurg bed follow-up patient -Pain control: As needed Percocet, fentanyl, Tylenol -Lidoderm -As needed Robaxin -PT/OT -TOC counsult  Fall at home, initial encounter -fall precaution -PT/OT  Essential hypertension - IV hydralazine as needed -Lisinopril, metoprolol  CAD (coronary artery disease)  No chest pain -Continue Lipitor, aspirin  Type II diabetes mellitus with renal manifestations (HCC) Recent A1c 11.9, poorly controlled.  Patient taking Actos, Ozempic, Levemir 18 units daily, Bydurone -SSI -Glargine insulin 12 units daily  HLD (hyperlipidemia) - Lipitor  Depression - Continue home medications  Chronic kidney disease, stage 3b (HCC) Recent baseline creatinine 1.47 on 08/25/2021.  Pending CMP -Follow-up CMP        DVT ppx: SQ Lovenox  Code Status: Full code  Family Communication:  Yes, patient's  daughter by phone  Disposition Plan: to be determined, may need rehab  Consults called:  ED physician discussed the case with Dr. Roland Rack of Ortho.  Admission status and Level of care: Med-Surg:   for obs      Severity of Illness:  The appropriate patient status for this patient is OBSERVATION. Observation status is judged to be reasonable and necessary in order to provide the required intensity of service to ensure the patient's safety. The patient's presenting symptoms, physical exam findings, and initial radiographic and laboratory data in the context of their medical condition is felt to place them at decreased risk for further clinical deterioration. Furthermore, it is anticipated that the patient will be medically stable for discharge from the hospital within 2 midnights of admission.        Date of Service 06/07/2022    Ivor Costa [facility]   If 7PM-7AM, please contact  night-coverage [email or web address] 06/07/2022, 7:07 PM

## 2022-06-07 NOTE — Assessment & Plan Note (Addendum)
Recent A1c 11.9, very poorly controlled.   During admission covered with sliding scale NovoLog, glargine insulin 12 units daily --Resume usual home regimen at discharge --Close PCP follow up --Monitor sugars closely

## 2022-06-07 NOTE — Assessment & Plan Note (Addendum)
BPs overall stable, soft times. Continue home lisinopril, metoprolol.

## 2022-06-07 NOTE — Assessment & Plan Note (Signed)
Stable with no active chest pain --Continue Lipitor, aspirin

## 2022-06-07 NOTE — ED Provider Notes (Signed)
[facility] Provider Note    Event Date/Time   First MD Initiated Contact with Patient 06/07/22 1023     (approximate)  History   Chief Complaint: Fall  HPI  [name] Munoz is a 76 y.o. female past medical history of CAD, diabetes, gastric reflux, hypertension, presents to the emergency department after mechanical fall.  According to the patient she was walking when she tripped causing her to fall forwards.  Patient states she caught her self on her knees and hands but states her face did come to the ground patient has some pain with to her upper lip and right knee.  Patient states she did have some pain to the right wrist although now denies any pain and has full range of motion.  Has not sure if she is on any blood thinners.  No LOC.  No vomiting.  Physical Exam   Triage Vital Signs: ED Triage Vitals  Enc Vitals Group     BP 06/07/22 1015 (!) 169/71     Pulse Rate 06/07/22 1015 79     Resp --      Temp 06/07/22 1015 98.3 F (36.8 C)     Temp Source 06/07/22 1015 Oral     SpO2 06/07/22 1015 100 %     Weight 06/07/22 1016 220 lb (99.8 kg)     Height --      Head Circumference --      Peak Flow --      Pain Score 06/07/22 1016 8     Pain Loc --      Pain Edu? --      Excl. in GC? --     Most recent vital signs: Vitals:   06/07/22 1015  BP: (!) 169/71  Pulse: 79  Temp: 98.3 F (36.8 C)  SpO2: 100%    General: Awake, no distress.  Patient has a small laceration to the inside of her upper lip, hemostatic.  Mild bruising and abrasion to the upper lip and nose.  No septal hematoma.  No blood in the nostrils. CV:  Good peripheral perfusion.  Regular rate and rhythm  Resp:  Normal effort.  Equal breath sounds bilaterally.  Abd:  No distention.  Soft, nontender.  No rebound or guarding. Other:  Significant tenderness to palpation of the right knee, neurovascular intact distally.  No tenderness to the hips.   ED Results / Procedures / Treatments    RADIOLOGY  I have reviewed the x-ray images.  Concern for possible distal femoral condyle fracture.  Awaiting radiology read.  CT scan of the head is negative, CT scan of the face shows no acute fractures.   MEDICATIONS ORDERED IN ED: Medications - No data to display   IMPRESSION / MDM / ASSESSMENT AND PLAN / ED COURSE  I reviewed the triage vital signs and the nursing notes.  Patient's presentation is most consistent with acute presentation with potential threat to life or bodily function.  Patient presents to the emergency department after mechanical fall.  Patient's main complaint is right knee pain.  Patient did hit her head unknown anticoagulation status.  We will check labs and obtain CT imaging of the head and face as a precaution.  Patient does have a small laceration to the inside of the upper lip, no repair needed, hemostatic.  Patient does have significant tenderness to the right knee worse with any attempted range of motion.  Neurovascular intact distally with 2+ DP pulse and normal sensation.  Will obtain x-ray imaging any.  Will continue close monitor while awaiting results.  Patient agreeable to plan of care.  X-ray shows possible lateral condyle fracture of the femur.  CT scan confirms lateral condyle fracture.  I spoke to Dr. Joice Lofts of orthopedics who states this is likely nonsurgical to place in a knee immobilizer with weightbearing as tolerated.  Patient unfortunately is unable to tolerate any weight or movement.  Patient given pain medication.  I spoke to the patient we will admit to the hospital service for ongoing pain management patient may require rehab placement following hospitalization.  CBC is normal remainder the lab work hemolyzed we are waiting  FINAL CLINICAL IMPRESSION(S) / ED DIAGNOSES   Fall Right knee pain Head injury Lateral condyle fracture of the distal right femur  Note:  This document was prepared using Dragon voice recognition software and may  include unintentional dictation errors.   Minna Antis, MD 06/07/22 1536

## 2022-06-07 NOTE — ED Notes (Signed)
Lab called to notify this RN that certain blood specimens had hemolyzed, Lab asked to recollect due to difficult stick.

## 2022-06-07 NOTE — ED Notes (Signed)
Lab states they were not able to obtain blood work and that they would send someone else

## 2022-06-07 NOTE — Plan of Care (Signed)
  Problem: Nutrition: Goal: Adequate nutrition will be maintained Outcome: Progressing   Problem: Elimination: Goal: Will not experience complications related to bowel motility Outcome: Progressing   Problem: Pain Managment: Goal: General experience of comfort will improve Outcome: Progressing   Problem: Safety: Goal: Ability to remain free from injury will improve Outcome: Progressing   

## 2022-06-07 NOTE — ED Notes (Signed)
Hinda Lenis, MD at [facility] [phone]

## 2022-06-07 NOTE — ED Notes (Signed)
Lab at bedside

## 2022-06-07 NOTE — Assessment & Plan Note (Addendum)
ED physician discussed with Dr. Joice Lofts of Ortho, per Dr. Joice Lofts of orthopedics, this is nonsurgical. --Knee immobilizer brace --Weightbearing as tolerated --Pain control: scheduled Tylenol, PRN oxycodone, Robaxin PRN, IV fentanyl PRN breakthrough pain only, Lidoderm path -- PT/OT - SNF recommended

## 2022-06-07 NOTE — ED Triage Notes (Addendum)
PT coming from home at [facility]. PT was ambulated and had mechanical fall. Pt uses walker but didn't have it. Face contusions. R Knee lateral knee pain, R wrist pain.no LOC no neck or back pain. Pedal pulse on R foot. Vss.  Pt unsure if they are on thinners. Pt has laceration to upper lip

## 2022-06-07 NOTE — Assessment & Plan Note (Addendum)
-   Continue home Wellbutrin 

## 2022-06-07 NOTE — Assessment & Plan Note (Signed)
Renal function stable.  Creatinine on admission 1.26.  Recent baseline Cr 1.47 in Oct 2022. --Monitor BMP

## 2022-06-07 NOTE — ED Notes (Signed)
See triage note.  Pt denies LOC, no deformities noted. Pt is A&Ox4 at this time.

## 2022-06-07 NOTE — Assessment & Plan Note (Addendum)
Fall precautions PT and OT recommends SNF Management of fracture as outlined

## 2022-06-07 NOTE — Assessment & Plan Note (Signed)
-  Continue Lipitor °

## 2022-06-08 ENCOUNTER — Encounter: Payer: Self-pay | Admitting: Internal Medicine

## 2022-06-08 DIAGNOSIS — E669 Obesity, unspecified: Secondary | ICD-10-CM | POA: Diagnosis present

## 2022-06-08 DIAGNOSIS — S72424D Nondisplaced fracture of lateral condyle of right femur, subsequent encounter for closed fracture with routine healing: Secondary | ICD-10-CM | POA: Diagnosis not present

## 2022-06-08 DIAGNOSIS — D696 Thrombocytopenia, unspecified: Secondary | ICD-10-CM | POA: Diagnosis present

## 2022-06-08 LAB — GLUCOSE, CAPILLARY
Glucose-Capillary: 118 mg/dL — ABNORMAL HIGH (ref 70–99)
Glucose-Capillary: 172 mg/dL — ABNORMAL HIGH (ref 70–99)
Glucose-Capillary: 208 mg/dL — ABNORMAL HIGH (ref 70–99)
Glucose-Capillary: 165 mg/dL — ABNORMAL HIGH (ref 70–99)

## 2022-06-08 MED ORDER — OXYCODONE HCL 5 MG PO TABS: 10.0000 mg | ORAL_TABLET | ORAL | Status: DC | PRN

## 2022-06-08 MED ORDER — OXYCODONE HCL 5 MG PO TABS
5.0000 mg | ORAL_TABLET | ORAL | Status: DC | PRN
  Administered 2022-06-08: 10 mg via ORAL
  Administered 2022-06-08: 5 mg via ORAL
  Administered 2022-06-09 (×2): 10 mg via ORAL
  Filled 2022-06-08 (×2): qty 2
  Filled 2022-06-08: qty 1
  Filled 2022-06-08: qty 2

## 2022-06-08 MED ORDER — ACETAMINOPHEN 500 MG PO TABS
1000.0000 mg | ORAL_TABLET | Freq: Three times a day (TID) | ORAL | Status: DC
  Administered 2022-06-08 – 2022-06-09 (×3): 1000 mg via ORAL
  Filled 2022-06-08 (×3): qty 2

## 2022-06-08 NOTE — TOC Progression Note (Signed)
Transition of Care [facility]) - Progression Note    Patient Details  Name: [name] Munoz MRN: [id number] Date of Birth: [birth year]  Transition of Care [facility]) CM/SW Contact  Marlowe Sax, RN Phone Number: 06/08/2022, 2:55 PM  Clinical Narrative:    Sherron Monday with Marcelino Duster the social worker at [facility], [phone], She stated that they are agreeable for the patient to go to SNF for pain control, they have a contract with [facility], [facility] and [facility] and are good to accept which ever one the patient would like out of those, [facility] will provide transportation Bedsearch sent to the 3 facilities, FL2 complete and PASSR obtained        Expected Discharge Plan and Services                                                 Social Determinants of Health (SDOH) Interventions    Readmission Risk Interventions     No data to display

## 2022-06-08 NOTE — TOC Progression Note (Signed)
Transition of Care [facility]) - Progression Note    Patient Details  Name: [name] Munoz MRN: [id number] Date of Birth: September 22, 1946  Transition of Care [facility]) CM/SW Contact  Marlowe Sax, RN Phone Number: 06/08/2022, 9:03 AM  Clinical Narrative:     Spoke with Nurse Maryruth Hancock at [facility], I explained Observation status I requested to speak with someone about the Plan She will have the [facility] physician give me a call after their morning meeting       Expected Discharge Plan and Services                                                 Social Determinants of Health (SDOH) Interventions    Readmission Risk Interventions     No data to display

## 2022-06-08 NOTE — Care Management Obs Status (Signed)
MEDICARE OBSERVATION STATUS NOTIFICATION   Patient Details  Name: [name] Munoz MRN: [id number] Date of Birth: 09-Jan-1946   Medicare Observation Status Notification Given:  Yes    Marlowe Sax, RN 06/08/2022, 9:07 AM

## 2022-06-08 NOTE — Plan of Care (Signed)

## 2022-06-08 NOTE — Evaluation (Signed)
Physical Therapy Evaluation Patient Details Name: [name] Munoz MRN: [id number] DOB: 1946/04/14 Today's Date: 06/08/2022  History of Present Illness  Pt admitted for fall at home with acute non-surgical fx to R lateral condyle of R femur. Prior ORIF. HIstory includes HTN, HLD, DM, GERD, depression, and CAD. Pt is [facility] patient and attends 3d/wk.  Clinical Impression  Pt is a pleasant 76 year old female who was admitted for nondisplaced fx of lateral condyle of R femur, non surgical at this time. Pt performs bed mobility with mod assist and transfers with max assist and RW. Heavy B UE support on RW, unable to weight shift in order to take steps. Pt demonstrates deficits with strength/mobility/pain. KI donned for all mobility. Rates pain at 10/10 with exertion. Currently unsafe to dc home without constant support and physical assistance. Would benefit from skilled PT to address above deficits and promote optimal return to PLOF; recommend transition to STR upon discharge from acute hospitalization.      Recommendations for follow up therapy are one component of a multi-disciplinary discharge planning process, led by the attending physician.  Recommendations may be updated based on patient status, additional functional criteria and insurance authorization.  Follow Up Recommendations Skilled nursing-short term rehab (<3 hours/day) Can patient physically be transported by private vehicle: No    Assistance Recommended at Discharge Frequent or constant Supervision/Assistance  Patient can return home with the following  Two people to help with walking and/or transfers;Two people to help with bathing/dressing/bathroom;Help with stairs or ramp for entrance    Equipment Recommendations Rolling walker (2 wheels)  Recommendations for Other Services       Functional Status Assessment Patient has had a recent decline in their functional status and demonstrates the ability to make significant improvements in  function in a reasonable and predictable amount of time.     Precautions / Restrictions Precautions Precautions: Fall Required Braces or Orthoses: Knee Immobilizer - Right Knee Immobilizer - Right: On when out of bed or walking Restrictions Weight Bearing Restrictions: Yes RLE Weight Bearing: Weight bearing as tolerated Other Position/Activity Restrictions: per H&P- documented WBAT with KI per DR. Poggi      Mobility  Bed Mobility Overal bed mobility: Needs Assistance Bed Mobility: Supine to Sit, Sit to Supine     Supine to sit: Mod assist Sit to supine: Max assist   General bed mobility comments: needs significant assist for B LEs and reports pain for all mobility. Once seated, able to maintain with supervision. Max assist for return supine with bed in trend and pt able to use railing to pull up    Transfers Overall transfer level: Needs assistance Equipment used: Rolling walker (2 wheels) Transfers: Sit to/from Stand Sit to Stand: Max assist, From elevated surface           General transfer comment: 3 attempts for sit<>Stand with pt successful on rep 2. Able to maintain static standing, however unable to weight shift to take steps over to recliner. Anticipate +2 for transfer.    Ambulation/Gait               General Gait Details: unable  Stairs            Wheelchair Mobility    Modified Rankin (Stroke Patients Only)       Balance Overall balance assessment: Needs assistance, History of Falls Sitting-balance support: Feet supported Sitting balance-Leahy Scale: Good     Standing balance support: Bilateral upper extremity supported, During functional activity Standing balance-Leahy  Scale: Poor                               Pertinent Vitals/Pain Pain Assessment Pain Assessment: 0-10 Pain Score: 10-Worst pain ever Pain Location: R knee Pain Descriptors / Indicators: Aching, Discomfort, Grimacing Pain Intervention(s): Limited  activity within patient's tolerance, Premedicated before session    Home Living Family/patient expects to be discharged to:: Private residence Living Arrangements: Alone   Type of Home: House           Home Equipment: Cane - single point;Rollator (4 wheels) Additional Comments: Pt reports she has very limited support at home    Prior Function Prior Level of Function : Needs assist             Mobility Comments: uses rollator in community and [facility] in home environment. Pt had fall due to ambulating without AD       Hand Dominance        Extremity/Trunk Assessment   Upper Extremity Assessment Upper Extremity Assessment: Generalized weakness (B UE grossly 3+/5)    Lower Extremity Assessment Lower Extremity Assessment: Generalized weakness (R LE grossly 2/5; L LE grossly 4/5)       Communication   Communication: No difficulties  Cognition Arousal/Alertness: Awake/alert Behavior During Therapy: WFL for tasks assessed/performed Overall Cognitive Status: Within Functional Limits for tasks assessed                                          General Comments      Exercises Other Exercises Other Exercises: supine ther-ex performed on B LE including AP, hip abd/add, and SLR. Mod assist required for R LE. KI donned for all mobility   Assessment/Plan    PT Assessment Patient needs continued PT services  PT Problem List Decreased strength;Decreased activity tolerance;Decreased balance;Decreased mobility;Obesity;Pain       PT Treatment Interventions DME instruction;Gait training;Therapeutic activities;Therapeutic exercise;Balance training    PT Goals (Current goals can be found in the Care Plan section)  Acute Rehab PT Goals Patient Stated Goal: to get stronger and stay mobile PT Goal Formulation: With patient Time For Goal Achievement: 06/22/22 Potential to Achieve Goals: Good    Frequency 7X/week     Co-evaluation                AM-PAC PT "6 Clicks" Mobility  Outcome Measure Help needed turning from your back to your side while in a flat bed without using bedrails?: A Little Help needed moving from lying on your back to sitting on the side of a flat bed without using bedrails?: A Lot Help needed moving to and from a bed to a chair (including a wheelchair)?: Total Help needed standing up from a chair using your arms (e.g., wheelchair or bedside chair)?: A Lot Help needed to walk in hospital room?: A Lot Help needed climbing 3-5 steps with a railing? : A Lot 6 Click Score: 12    End of Session Equipment Utilized During Treatment: Gait belt Activity Tolerance: Patient limited by pain Patient left: in bed;with bed alarm set Nurse Communication: Mobility status PT Visit Diagnosis: Unsteadiness on feet (R26.81);Muscle weakness (generalized) (M62.81);History of falling (Z91.81);Difficulty in walking, not elsewhere classified (R26.2);Pain Pain - Right/Left: Right Pain - part of body: Knee    Time: 5366-4403 PT Time Calculation (min) (ACUTE ONLY): 25  min   Charges:   PT Evaluation $PT Eval Moderate Complexity: 1 Mod PT Treatments $Therapeutic Exercise: 8-22 mins        Greggory Stallion, PT, DPT, GCS [phone]   [name] Munoz 06/08/2022, 1:03 PM

## 2022-06-08 NOTE — Assessment & Plan Note (Signed)
Body mass index is 32.49 kg/m. Complicates overall care and prognosis.  Recommend lifestyle modifications including physical activity and diet for weight loss and overall long-term health.

## 2022-06-08 NOTE — Progress Notes (Addendum)
Progress Note   Patient: [name] Munoz IRS:[id number] DOB: July 24, 1946 DOA: 06/07/2022     0 DOS: the patient was seen and examined on 06/08/2022   Brief hospital course: 76 y.o. female with medical history significant of  with hypertension, hyperlipidemia, diabetes mellitus, GERD, depression, CAD, CKD-3B, who presented on 06/07/2022 after having a mechanical fall with subsequent severe right knee pain.  Having fallen face first, she injured her lip and sustained a small abrasion to the right wrist.  No LOC.    Evaluation in the ED including CT of right knee revealed an acute fracture of the right lateral femoral condyle without significant displacement, and prior ORIF of the lateral tibial plateau with no sign of hardware complication or new fracture.  There was moderate sized lipohemarthrosis of the right knee.  CT maxillofacial was negative for acute injuries.  Patient was admitted to the hospital for pain control and PT/OT evaluations.   Orthopedic surgery was contacted by ED physician and recommended non-operative management with knee immobilizing brace and pain control.  PT/OT evaluated and recommend SNF/rehab. Patient is agreeable.  Of note, patient is followed by [facility]. They are following closely and coordinating with [facility] for SNF discharge.  Assessment and Plan: * Closed nondisplaced fracture of lateral condyle of right femur [facility]) ED physician discussed with Dr. Joice Lofts of Ortho, per Dr. Joice Lofts of orthopedics, this is nonsurgical. --Knee immobilizer brace --Weightbearing as tolerated --Pain control: scheduled Tylenol, PRN oxycodone, Robaxin PRN, IV fentanyl PRN breakthrough pain only, Lidoderm path -- PT/OT - SNF recommended -- TOC working on placement  Fall at home, initial encounter Fall precautions PT and OT recommends SNF, placement pending Management of fracture as outlined  Essential hypertension BPs overall stable, soft times. Continue home lisinopril, metoprolol. IV  hydralazine as needed.   CAD (coronary artery disease) Stable with no active chest pain --Continue Lipitor, aspirin  Type II diabetes mellitus with renal manifestations (HCC) Recent A1c 11.9, poorly controlled.   Home regimen: Actos, Ozempic, Levemir 18 units daily, Bydurone --Sliding scale NovoLog --Basal insulin 12 units daily -- Adjust insulin for inpatient goal 140-180  HLD (hyperlipidemia) Continue Lipitor  Depression Continue home Wellbutrin  Chronic kidney disease, stage 3b (HCC) Renal function stable.  Creatinine on admission 1.26.  Recent baseline Cr 1.47 in Oct 2022. --Monitor BMP  Thrombocytopenia (HCC) Platelets on admission 130k. Appears this is chronic. --Monitor CBC  Obesity (BMI 30-39.9) Body mass index is 32.49 kg/m. Complicates overall care and prognosis.  Recommend lifestyle modifications including physical activity and diet for weight loss and overall long-term health.         Subjective: Patient was awake sitting up in bed when seen on rounds today.  She reported knee pain 5 out of 10 in severity.  PT had been in to evaluate and patient reports it did not go well.  This was in part due to pain.  She also vomited shortly after getting up with them and had been recently given pain medication.  She reports living alone and is agreeable to rehab.  No other acute complaints at this time  Physical Exam: Vitals:   06/07/22 2337 06/08/22 0506 06/08/22 0743 06/08/22 1113  BP: (!) 125/47 (!) 111/91 (!) 110/43 138/83  Pulse: 78 79 72 75  Resp: 17 17 16 16   Temp: 98 F (36.7 C) 98.3 F (36.8 C) 98.2 F (36.8 C) 98.6 F (37 C)  TempSrc:      SpO2: 100% 96% 100% 96%  Weight:  Height:       General exam: awake, alert, no acute distress, obese HEENT: moist mucus membranes, hearing grossly normal  Respiratory system: On room air, normal respiratory effort. Cardiovascular system: Regular rate and rhythm, no significant peripheral edema.    Gastrointestinal system: soft, nontender abdomen Central nervous system: A&O x3. no gross focal neurologic deficits, normal speech Extremities: Right leg in knee immobilizing brace, no edema, normal tone Skin: dry, intact, normal temperature Psychiatry: Depressed mood, flat affect, judgement and insight appear normal   Data Reviewed:  Notable labs: CMP notable for glucose 183, creatinine 1.26, GFR 45, urinalysis normal no signs of infection  Family Communication: None.   I spoke to Dr. Victory Dakin with [facility] this afternoon regarding SNF recommendation  Disposition: Status is: Observation The patient remains OBS appropriate and will d/c before 2 midnights.    Planned Discharge Destination: Skilled nursing facility    Time spent: 45 minutes  Author: Pennie Banter, DO 06/08/2022 4:11 PM  For on call review [email or web address].

## 2022-06-08 NOTE — Assessment & Plan Note (Signed)
Platelets on admission 130k. Appears this is chronic. --Monitor CBC

## 2022-06-08 NOTE — Hospital Course (Addendum)
76 y.o. female with medical history significant of  with hypertension, hyperlipidemia, diabetes mellitus, GERD, depression, CAD, CKD-3B, who presented on 06/07/2022 after having a mechanical fall with subsequent severe right knee pain.  Having fallen face first, she injured her lip and sustained a small abrasion to the right wrist.  No LOC.    Evaluation in the ED including CT of right knee revealed an acute fracture of the right lateral femoral condyle without significant displacement, and prior ORIF of the lateral tibial plateau with no sign of hardware complication or new fracture.  There was moderate sized lipohemarthrosis of the right knee.  CT maxillofacial was negative for acute injuries.  Patient was admitted to the hospital for pain control and PT/OT evaluations.   Orthopedic surgery was contacted by ED physician and recommended non-operative management with knee immobilizing brace and pain control.  PT/OT evaluated and recommend SNF/rehab. Patient is agreeable.  Of note, patient is followed by [facility]. They are following closely and coordinating with [facility] for SNF discharge.

## 2022-06-08 NOTE — NC FL2 (Signed)
[place] MEDICAID FL2 LEVEL OF CARE SCREENING TOOL     IDENTIFICATION  Patient Name: [name] Munoz Birthdate: Nov 07, 1945 Sex: female Admission Date (Current Location): 06/07/2022  [facility] and [place] Number:  Chiropodist and Address:  [facility], [address], [place], [place] [address]      Provider Number: [id number]  Attending Physician Name and Address:  Pennie Banter, DO  Relative Name and Phone Number:  Barnetta Hammersmith [phone]    Current Level of Care: Hospital Recommended Level of Care: Skilled Nursing Facility Prior Approval Number:    Date Approved/Denied:   PASRR Number: pending  Discharge Plan: SNF    Current Diagnoses: Patient Active Problem List   Diagnosis Date Noted   Closed nondisplaced fracture of femoral condyle (HCC) 06/07/2022   Type II diabetes mellitus with renal manifestations (HCC)    Chronic kidney disease, stage 3b (HCC)    Fall at home, initial encounter    HLD (hyperlipidemia)    Depression    Closed nondisplaced fracture of lateral condyle of right femur (HCC)    CAD (coronary artery disease) 08/23/2021   Diabetes (HCC) 08/23/2021   GERD (gastroesophageal reflux disease) 08/23/2021   Essential hypertension 08/23/2021   COVID-19 virus infection 08/23/2021   Tibial fracture 05/15/2019   Hypotension 10/17/2018   UTI (urinary tract infection) 10/21/2015   Acute renal failure (ARF) (HCC) 10/19/2015   Intractable vomiting 09/18/2015    Orientation RESPIRATION BLADDER Height & Weight     Self, Time, Situation, Place  Normal Continent, External catheter Weight: 99.8 kg Height:  5\' 9"  (175.3 cm)  BEHAVIORAL SYMPTOMS/MOOD NEUROLOGICAL BOWEL NUTRITION STATUS      Continent Diet (See DC summary)  AMBULATORY STATUS COMMUNICATION OF NEEDS Skin   Extensive Assist Verbally Normal, Skin abrasions                       Personal Care Assistance Level of Assistance  Bathing, Feeding, Dressing  Bathing Assistance: Limited assistance Feeding assistance: Independent Dressing Assistance: Limited assistance     Functional Limitations Info             SPECIAL CARE FACTORS FREQUENCY  PT (By licensed PT), OT (By licensed OT)     PT Frequency: 5 times per week OT Frequency: 5 times per week            Contractures Contractures Info: Not present    Additional Factors Info  Code Status, Allergies Code Status Info: Full COde Allergies Info: Erythromycin, Lamisil (Terbinafine Hcl), Tramadol Hcl, Penicillins           Current Medications (06/08/2022):  This is the current hospital active medication list Current Facility-Administered Medications  Medication Dose Route Frequency Provider Last Rate Last Admin   acetaminophen (TYLENOL) tablet 1,000 mg  1,000 mg Oral Q8H Griffith, Kelly A, DO   1,000 mg at 06/08/22 1440   aspirin EC tablet 81 mg  81 mg Oral Daily 08/08/22, MD   81 mg at 06/08/22 0915   atorvastatin (LIPITOR) tablet 40 mg  40 mg Oral Daily 08/08/22, MD   40 mg at 06/08/22 0915   buPROPion (WELLBUTRIN XL) 24 hr tablet 300 mg  300 mg Oral Daily 08/08/22, MD   300 mg at 06/08/22 08/08/22   cholecalciferol (VITAMIN D3) 25 MCG (1000 UNIT) tablet 2,000 Units  2,000 Units Oral Daily [id number], MD   2,000 Units at 06/08/22 0915   enoxaparin (LOVENOX) injection  40 mg  40 mg Subcutaneous Q24H Lorretta Harp, MD   40 mg at 06/07/22 1756   fentaNYL (SUBLIMAZE) injection 25 mcg  25 mcg Intravenous Q3H PRN Lorretta Harp, MD       hydrALAZINE (APRESOLINE) injection 5 mg  5 mg Intravenous Q2H PRN Lorretta Harp, MD       insulin aspart (novoLOG) injection 0-5 Units  0-5 Units Subcutaneous QHS Lorretta Harp, MD       insulin aspart (novoLOG) injection 0-9 Units  0-9 Units Subcutaneous TID WC Lorretta Harp, MD   2 Units at 06/08/22 1212   insulin glargine-yfgn (SEMGLEE) injection 12 Units  12 Units Subcutaneous Daily Lorretta Harp, MD   12 Units at 06/08/22 0916   lidocaine (LIDODERM) 5 % 1 patch   1 patch Transdermal Q24H Lorretta Harp, MD       lisinopril (ZESTRIL) tablet 10 mg  10 mg Oral Daily Lorretta Harp, MD   10 mg at 06/08/22 0915   liver oil-zinc oxide (DESITIN) 40 % ointment   Topical BID Lorretta Harp, MD   Given at 06/08/22 9833   magnesium hydroxide (MILK OF MAGNESIA) suspension 6 mL  6 mL Oral BID PRN Lorretta Harp, MD       methocarbamol (ROBAXIN) tablet 500 mg  500 mg Oral Q8H PRN Lorretta Harp, MD   500 mg at 06/08/22 1517   metoprolol succinate (TOPROL-XL) 24 hr tablet 25 mg  25 mg Oral Daily Lorretta Harp, MD   25 mg at 06/08/22 0915   nystatin (MYCOSTATIN/NYSTOP) topical powder   Topical BID Lorretta Harp, MD   Given at 06/08/22 0918   ondansetron (ZOFRAN) injection 4 mg  4 mg Intravenous Q8H PRN Lorretta Harp, MD       oxyCODONE (Oxy IR/ROXICODONE) immediate release tablet 5-10 mg  5-10 mg Oral Q4H PRN Esaw Grandchild A, DO   10 mg at 06/08/22 1516   pantoprazole (PROTONIX) EC tablet 40 mg  40 mg Oral Daily Lorretta Harp, MD   40 mg at 06/08/22 0915   senna (SENOKOT) tablet 8.6 mg  1 tablet Oral Daily PRN Lorretta Harp, MD         Discharge Medications: Please see discharge summary for a list of discharge medications.  Relevant Imaging Results:  Relevant Lab Results:   Additional Information SSN: [id number]  Marlowe Sax, RN

## 2022-06-08 NOTE — TOC Progression Note (Signed)
Transition of Care [facility]) - Progression Note    Patient Details  Name: [name] Munoz MRN: [id number] Date of Birth: 1946-03-10  Transition of Care [facility]) CM/SW Contact  Marlowe Sax, RN Phone Number: 06/08/2022, 3:56 PM  Clinical Narrative:     Uploaded the Clinical documents to [place] MUST for PASSR, PASSR number pending       Expected Discharge Plan and Services                                                 Social Determinants of Health (SDOH) Interventions    Readmission Risk Interventions     No data to display

## 2022-06-08 NOTE — Progress Notes (Signed)
The above named patient is recommended to go to Short Term Rehab for strengthening and gait training for balance.  It is expected that the Short Term Rehab stay will be less than 30 days.  The patient is expected to return home after Rehab.  ?

## 2022-06-08 NOTE — Evaluation (Signed)
Occupational Therapy Evaluation Patient Details Name: [name] Munoz MRN: [id number] DOB: June 09, 1946 Today's Date: 06/08/2022   History of Present Illness Pt admitted for fall at home with acute non-surgical fx to R lateral condyle of R femur. Prior ORIF. HIstory includes HTN, HLD, DM, GERD, depression, and CAD. Pt is [facility] patient and attends [facility]d/wk.   Clinical Impression   Pt was seen for OT evaluation this date. Prior to hospital admission, pt was ambulating with SPC vs rollator at her apartment and reports mod indep with bathing, dressing, and light meal prep. She does report that changing the sheets of her bed is very tiring for her. Pt reports that her daughter sometimes provides transportation to/from grocery store, and pt uses an alarm to assist her in remembering to take her medications (blister packs). Pt presents to acute OT demonstrating impaired ADL performance and functional mobility 2/2 R knee pain, decr ROM/strength, and impaired balance (See OT problem list). Pt currently requires MIN-MOD A for bed mobility, MAX A to stand with cues for sequencing and RW, and MOD-MAX A for LB ADL tasks. Pt returned to sitting after a few brief shuffled sidesteps with MOD A and prior to return to bed, pt became nauseated, throwing up. RN notified. Pt would benefit from skilled OT services to address noted impairments and functional limitations (see below for any additional details) in order to maximize safety and independence while minimizing falls risk and caregiver burden. Upon hospital discharge, recommend STR to maximize pt safety and return to PLOF.      Recommendations for follow up therapy are one component of a multi-disciplinary discharge planning process, led by the attending physician.  Recommendations may be updated based on patient status, additional functional criteria and insurance authorization.   Follow Up Recommendations  Skilled nursing-short term rehab (<3 hours/day)    Assistance  Recommended at Discharge Frequent or constant Supervision/Assistance  Patient can return home with the following A lot of help with walking and/or transfers;A lot of help with bathing/dressing/bathroom;Assistance with cooking/housework;Assist for transportation;Help with stairs or ramp for entrance;Direct supervision/assist for medications management    Functional Status Assessment  Patient has had a recent decline in their functional status and demonstrates the ability to make significant improvements in function in a reasonable and predictable amount of time.  Equipment Recommendations  BSC/3in1    Recommendations for Other Services       Precautions / Restrictions Precautions Precautions: Fall Required Braces or Orthoses: Knee Immobilizer - Right Knee Immobilizer - Right: On when out of bed or walking Restrictions Weight Bearing Restrictions: Yes RLE Weight Bearing: Weight bearing as tolerated Other Position/Activity Restrictions: per H&P- documented WBAT with KI per DR. Poggi      Mobility Bed Mobility   Bed Mobility: Supine to Sit, Sit to Supine     Supine to sit: Min assist Sit to supine: Mod assist   General bed mobility comments: VC for sequencing, RLE mgt    Transfers Overall transfer level: Needs assistance Equipment used: Rolling walker (2 wheels) Transfers: Sit to/from Stand Sit to Stand: Max assist, From elevated surface           General transfer comment: VC for foot/hands positioning to improve technique      Balance Overall balance assessment: Needs assistance, History of Falls Sitting-balance support: Feet supported Sitting balance-Leahy Scale: Good     Standing balance support: Bilateral upper extremity supported, During functional activity Standing balance-Leahy Scale: Poor Standing balance comment: heavily reliant on RW  ADL either performed or assessed with clinical judgement   ADL                                          General ADL Comments: Pt currently requires MOD-MAX A for LB ADL tasks 2/2 RLE deficits. MAX A for ADL transfers using RW     Vision         Perception     Praxis      Pertinent Vitals/Pain Pain Assessment Pain Assessment: 0-10 Pain Score: 7  Pain Location: R knee Pain Descriptors / Indicators: Aching, Discomfort, Grimacing Pain Intervention(s): Limited activity within patient's tolerance, Monitored during session, Premedicated before session, Repositioned, Patient requesting pain meds-RN notified     Hand Dominance     Extremity/Trunk Assessment Upper Extremity Assessment Upper Extremity Assessment: Generalized weakness   Lower Extremity Assessment Lower Extremity Assessment: Generalized weakness       Communication Communication Communication: No difficulties   Cognition Arousal/Alertness: Awake/alert Behavior During Therapy: WFL for tasks assessed/performed Overall Cognitive Status: Within Functional Limits for tasks assessed                                       General Comments       Exercises Other Exercises Other Exercises: Once in standing, pt instucted in small side step/shuffles with heavy BUE support through RW to limit WBing 2/2 pain in R knee.   Shoulder Instructions      Home Living Family/patient expects to be discharged to:: Private residence Living Arrangements: Alone Available Help at Discharge: Family;Available PRN/intermittently (pt reports dtr provides transportation sometimes) Type of Home: Apartment Home Access: Elevator     Home Layout: One level     Bathroom Shower/Tub: Tub/shower unit         Home Equipment: Cane - single point;Rollator (4 wheels)   Additional Comments: Pt reports she has very limited support at home      Prior Functioning/Environment Prior Level of Function : Needs assist;History of Falls (last six months)             Mobility Comments: uses  rollator in community and [facility] in home environment. Pt had fall due to ambulating without AD ADLs Comments: Pt reports making the bed is the most tiring activity she completes, has an alarm set for meds (bubble packs provided by [facility]), typically does microwave meals/light meal prep        OT Problem List: Decreased strength;Decreased activity tolerance;Decreased knowledge of use of DME or AE;Impaired balance (sitting and/or standing);Decreased knowledge of precautions;Pain      OT Treatment/Interventions: Self-care/ADL training;Therapeutic exercise;Therapeutic activities;DME and/or AE instruction;Patient/family education;Balance training    OT Goals(Current goals can be found in the care plan section) Acute Rehab OT Goals Patient Stated Goal: have less pain and go back home OT Goal Formulation: With patient Time For Goal Achievement: 06/22/22 Potential to Achieve Goals: Good ADL Goals Pt Will Perform Lower Body Dressing: with min assist;sit to/from stand Pt Will Transfer to Toilet: with supervision;ambulating;bedside commode (LRAD) Pt Will Perform Toileting - Clothing Manipulation and hygiene: with modified independence;sitting/lateral leans Additional ADL Goal #1: Pt will independently instuct family/caregiver in KI mgt including positioning, donning/doffing, and wear schedule.  OT Frequency: Min 2X/week    Co-evaluation  AM-PAC OT "6 Clicks" Daily Activity     Outcome Measure Help from another person eating meals?: None Help from another person taking care of personal grooming?: A Little Help from another person toileting, which includes using toliet, bedpan, or urinal?: A Lot Help from another person bathing (including washing, rinsing, drying)?: A Lot Help from another person to put on and taking off regular upper body clothing?: A Little Help from another person to put on and taking off regular lower body clothing?: A Lot 6 Click Score: 16   End of Session  Equipment Utilized During Treatment: Rolling walker (2 wheels);Right knee immobilizer Nurse Communication: Mobility status;Other (comment) (nausea)  Activity Tolerance: Patient limited by pain (nausea) Patient left:    OT Visit Diagnosis: Other abnormalities of gait and mobility (R26.89);Muscle weakness (generalized) (M62.81);History of falling (Z91.81);Pain Pain - Right/Left: Right Pain - part of body: Knee                Time: [id number] OT Time Calculation (min): 24 min Charges:  OT General Charges $OT Visit: 1 Visit OT Evaluation $OT Eval Moderate Complexity: 1 Mod OT Treatments $Self Care/Home Management : 8-22 mins  Ardeth Perfect., MPH, MS, OTR/L ascom [phone] 06/08/22, 1:57 PM

## 2022-06-09 DIAGNOSIS — S72424D Nondisplaced fracture of lateral condyle of right femur, subsequent encounter for closed fracture with routine healing: Secondary | ICD-10-CM | POA: Diagnosis not present

## 2022-06-09 LAB — GLUCOSE, CAPILLARY
Glucose-Capillary: 181 mg/dL — ABNORMAL HIGH (ref 70–99)
Glucose-Capillary: 178 mg/dL — ABNORMAL HIGH (ref 70–99)

## 2022-06-09 MED ORDER — OXYCODONE HCL 5 MG PO TABS
5.0000 mg | ORAL_TABLET | ORAL | 0 refills | Status: AC | PRN
Start: 2022-06-09 — End: ?

## 2022-06-09 MED ORDER — POLYETHYLENE GLYCOL 3350 17 G PO PACK
17.0000 g | PACK | Freq: Every day | ORAL | Status: DC
  Administered 2022-06-09: 17 g via ORAL
  Filled 2022-06-09: qty 1

## 2022-06-09 MED ORDER — LIDOCAINE 5 % EX PTCH: 1.0000 | MEDICATED_PATCH | CUTANEOUS | 0 refills | Status: AC

## 2022-06-09 MED ORDER — ACETAMINOPHEN 500 MG PO TABS: 1000.0000 mg | ORAL_TABLET | Freq: Three times a day (TID) | ORAL | 0 refills | Status: AC

## 2022-06-09 MED ORDER — SENNOSIDES-DOCUSATE SODIUM 8.6-50 MG PO TABS: 1.0000 | ORAL_TABLET | Freq: Two times a day (BID) | ORAL | Status: AC

## 2022-06-09 MED ORDER — SENNOSIDES-DOCUSATE SODIUM 8.6-50 MG PO TABS
1.0000 | ORAL_TABLET | Freq: Two times a day (BID) | ORAL | Status: DC
Start: 2022-06-09 — End: 2022-06-09
  Administered 2022-06-09: 1 via ORAL
  Filled 2022-06-09: qty 1

## 2022-06-09 MED ORDER — ZINC OXIDE 40 % EX OINT
TOPICAL_OINTMENT | Freq: Two times a day (BID) | CUTANEOUS | 0 refills | Status: AC
Start: 2022-06-09 — End: ?

## 2022-06-09 MED ORDER — METHOCARBAMOL 500 MG PO TABS: 500.0000 mg | ORAL_TABLET | Freq: Three times a day (TID) | ORAL | Status: AC | PRN

## 2022-06-09 NOTE — Discharge Summary (Signed)
Physician Discharge Summary   Patient: [name] Munoz MRN: [id number] DOB: 08-Oct-1946  Admit date:     06/07/2022  Discharge date: 06/09/22  Discharge Physician: Ezekiel Slocumb   PCP: Micheline Chapman, FNP   Recommendations at discharge:   Follow up with [facility] provider/s Repeat CBC, BMP in 1-2 weeks  Discharge Diagnoses: Principal Problem:   Closed nondisplaced fracture of lateral condyle of right femur [facility] Active Problems:   Fall at home, initial encounter   Essential hypertension   CAD (coronary artery disease)   Type II diabetes mellitus with renal manifestations (HCC)   HLD (hyperlipidemia)   Depression   Chronic kidney disease, stage 3b (HCC)   Obesity (BMI 30-39.9)   Thrombocytopenia [facility])   Hospital Course: 76 y.o. female with medical history significant of  with hypertension, hyperlipidemia, diabetes mellitus, GERD, depression, CAD, CKD-3B, who presented on 06/07/2022 after having a mechanical fall with subsequent severe right knee pain.  Having fallen face first, she injured her lip and sustained a small abrasion to the right wrist.  No LOC.    Evaluation in the ED including CT of right knee revealed an acute fracture of the right lateral femoral condyle without significant displacement, and prior ORIF of the lateral tibial plateau with no sign of hardware complication or new fracture.  There was moderate sized lipohemarthrosis of the right knee.  CT maxillofacial was negative for acute injuries.  Patient was admitted to the hospital for pain control and PT/OT evaluations.   Orthopedic surgery was contacted by ED physician and recommended non-operative management with knee immobilizing brace and pain control.  PT/OT evaluated and recommend SNF/rehab. Patient is agreeable.  Of note, patient is followed by [facility] program. They are following closely and coordinating with [facility] for SNF discharge.   8/11: Patient stable for discharge to SNF/rehab today.   Assessment and  Plan: * Closed nondisplaced fracture of lateral condyle of right femur [facility]) ED physician discussed with Dr. Roland Rack of Ortho, per Dr. Roland Rack of orthopedics, this is nonsurgical. --Knee immobilizer brace --Weightbearing as tolerated --Pain control: scheduled Tylenol, PRN oxycodone, Robaxin PRN, IV fentanyl PRN breakthrough pain only, Lidoderm path -- PT/OT - SNF recommended   Fall at home, initial encounter Fall precautions PT and OT recommends SNF Management of fracture as outlined  Essential hypertension BPs overall stable, soft times. Continue home lisinopril, metoprolol.   CAD (coronary artery disease) Stable with no active chest pain --Continue Lipitor, aspirin  Type II diabetes mellitus with renal manifestations (HCC) Recent A1c 11.9, very poorly controlled.   During admission covered with sliding scale NovoLog, glargine insulin 12 units daily --Resume usual home regimen at discharge --Close PCP follow up --Monitor sugars closely  HLD (hyperlipidemia) Continue Lipitor  Depression Continue home Wellbutrin  Chronic kidney disease, stage 3b ([place]) Renal function stable.  Creatinine on admission 1.26.  Recent baseline Cr 1.47 in Oct 2022. --Monitor BMP  Thrombocytopenia (HCC) Platelets on admission 130k. Appears this is chronic. --Monitor CBC  Obesity (BMI 30-39.9) Body mass index is 32.49 kg/m. Complicates overall care and prognosis.  Recommend lifestyle modifications including physical activity and diet for weight loss and overall long-term health.          Consultants: None / Ortho discussed case with EDP Procedures performed: none   Disposition: Skilled nursing facility  Diet recommendation:  Discharge Diet Orders (From admission, onward)     Start     Ordered   06/09/22 0000  Diet - low sodium heart healthy  06/09/22 1148           Cardiac and Carb modified diet DISCHARGE MEDICATION: Allergies as of 06/09/2022       Reactions    Erythromycin    Lamisil [terbinafine Hcl]    Tramadol Hcl    Penicillins         Medication List     STOP taking these medications    citalopram 40 MG tablet Commonly known as: CELEXA   cyanocobalamin 1000 MCG tablet Commonly known as: VITAMIN B12   Ozempic (0.25 or 0.5 MG/DOSE) 2 MG/1.5ML Sopn Generic drug: Semaglutide(0.25 or 0.5MG /DOS)   pregabalin 75 MG capsule Commonly known as: LYRICA   sitaGLIPtin 50 MG tablet Commonly known as: JANUVIA   Tamiflu 30 MG capsule Generic drug: oseltamivir       TAKE these medications    acetaminophen 500 MG tablet Commonly known as: TYLENOL Take 2 tablets (1,000 mg total) by mouth every 8 (eight) hours. What changed:  medication strength how much to take when to take this reasons to take this   aspirin EC 81 MG tablet Take 81 mg by mouth daily.   atorvastatin 40 MG tablet Commonly known as: LIPITOR Take 40 mg by mouth daily.   buPROPion 300 MG 24 hr tablet Commonly known as: WELLBUTRIN XL Take 300 mg by mouth daily.   Bydureon BCise 2 MG/0.85ML Auij Generic drug: Exenatide ER Inject 2 mg into the skin daily.   cholecalciferol 25 MCG (1000 UNIT) tablet Commonly known as: VITAMIN D3 Take 2,000 Units by mouth daily.   Flonase Allergy Relief 50 MCG/ACT nasal spray Generic drug: fluticasone Place 1 spray into both nostrils daily.   insulin detemir 100 UNIT/ML injection Commonly known as: LEVEMIR Inject 18 Units into the skin daily.   lidocaine 5 % Commonly known as: LIDODERM Place 1 patch onto the skin daily. Remove & Discard patch within 12 hours or as directed by MD   lisinopril 10 MG tablet Commonly known as: ZESTRIL Take 10 mg by mouth daily.   liver oil-zinc oxide 40 % ointment Commonly known as: DESITIN Apply topically 2 (two) times daily.   magnesium hydroxide 400 MG/5ML suspension Commonly known as: MILK OF MAGNESIA Take 6 mLs by mouth 2 (two) times daily as needed for constipation.    methocarbamol 500 MG tablet Commonly known as: ROBAXIN Take 1 tablet (500 mg total) by mouth every 8 (eight) hours as needed for muscle spasms.   metoprolol succinate 25 MG 24 hr tablet Commonly known as: TOPROL-XL Take 25 mg by mouth daily.   nystatin powder Commonly known as: MYCOSTATIN/NYSTOP Apply 1 Application topically 2 (two) times daily.   ondansetron 4 MG tablet Commonly known as: ZOFRAN Take 4 mg by mouth every 8 (eight) hours as needed for nausea or vomiting.   oxyCODONE 5 MG immediate release tablet Commonly known as: Oxy IR/ROXICODONE Take 1-2 tablets (5-10 mg total) by mouth every 4 (four) hours as needed for moderate pain or severe pain.   pantoprazole 40 MG tablet Commonly known as: PROTONIX Take 40 mg by mouth daily.   pioglitazone 45 MG tablet Commonly known as: ACTOS Take 1 tablet (45 mg total) by mouth daily.   polyethylene glycol 17 g packet Commonly known as: MIRALAX / GLYCOLAX Take 17 g by mouth daily.   senna-docusate 8.6-50 MG tablet Commonly known as: Senokot-S Take 1 tablet by mouth 2 (two) times daily. Hold if loose or frequent stools        Contact  information for after-discharge care     Destination     [facility] .   Service: Skilled Nursing Contact information: [address] [place] [address] [phone]                    Discharge Exam: Ceasar Mons Weights   06/07/22 1016 06/07/22 1809  Weight: 99.8 kg 99.8 kg   General exam: awake, alert, no acute distress HEENT: upper lip swelling improved still mild ecchymosis, moist mucus membranes, hearing grossly normal  Respiratory system: CTAB, no wheezes, rales or rhonchi, normal respiratory effort. Cardiovascular system: normal S1/S2, RRR, no JVD, murmurs, rubs, gallops, no pedal edema.   Gastrointestinal system: soft, NT, ND, no HSM felt, +bowel sounds. Central nervous system: A&O x3. no gross focal neurologic deficits,  normal speech Extremities: Right LE in knee immobilizer brace, no edema, normal tone Skin: dry, intact, normal temperature Psychiatry: normal mood, congruent affect, judgement and insight appear normal   Condition at discharge: stable  The results of significant diagnostics from this hospitalization (including imaging, microbiology, ancillary and laboratory) are listed below for reference.   Imaging Studies: CT Knee Right Wo Contrast  Result Date: 06/07/2022 CLINICAL DATA:  Fall, right knee pain.  Abnormal x-ray EXAM: CT OF THE RIGHT KNEE WITHOUT CONTRAST TECHNIQUE: Multidetector CT imaging of the right knee was performed according to the standard protocol. Multiplanar CT image reconstructions were also generated. RADIATION DOSE REDUCTION: This exam was performed according to the departmental dose-optimization program which includes automated exposure control, adjustment of the mA and/or kV according to patient size and/or use of iterative reconstruction technique. COMPARISON:  X-ray 06/07/2022, CT 05/15/2019 FINDINGS: Bones/Joint/Cartilage Diffuse osseous demineralization. Acute fracture involving the posterior and lateral aspects of the lateral femoral condyle without significant displacement (series 4, image 105). Prior ORIF of a lateral tibial plateau fracture with sideplate and screw fixation construct. Hardware appears intact and well seated. No evidence of an acute fracture involving the proximal tibia. The proximal fibula and patella are intact without fracture. Moderate-sized knee joint lipohemarthrosis. Ligaments Suboptimally assessed by CT. Muscles and Tendons No acute musculotendinous abnormality by CT. Soft tissues No fluid collection or hematoma within the soft tissues. Superficial venous varicosities. Vascular atherosclerosis. IMPRESSION: 1. Acute fracture of the lateral femoral condyle without significant displacement. 2. Prior ORIF of a lateral tibial plateau fracture without evidence of  hardware complication or new fracture. 3. Moderate-sized knee joint lipohemarthrosis. Electronically Signed   By: Duanne Guess D.O.   On: 06/07/2022 13:42   DG Knee Complete 4 Views Right  Result Date: 06/07/2022 CLINICAL DATA:  Fall.  Pain EXAM: RIGHT KNEE - COMPLETE 4+ VIEW COMPARISON:  CT right knee 05/15/2019. C-arm images right knee 6 05/16/2019 FINDINGS: Chronic fracture lateral tibial plateau with depression. This has been fixed with a lateral plate and multiple screws. Bone fragment just above the surgical screws could represent residual fracture which has not healed versus a new fracture. No recent x-rays of the area are available. In addition, there is a moderately large joint effusion. Possible fracture of the lateral femoral condyle. This could be acute or chronic. This is not seen on the prior study. There is soft tissue calcification the medial collateral ligament due to chronic injury. This was present on the prior CT. IMPRESSION: Chronic fracture lateral tibial plateau with prior surgical fixation. Possible recurrent fracture lateral tibial plateau versus fracture fragments which are not healed. Correlate with interval imaging if available. Possible acute fracture  of the lateral femoral condyle. There is a moderate joint effusion Consider CT of the right knee for further evaluation. Electronically Signed   By: Franchot Gallo M.D.   On: 06/07/2022 12:15   CT Maxillofacial Wo Contrast  Result Date: 06/07/2022 CLINICAL DATA:  76 year old female status post fall. Face contusions. EXAM: CT MAXILLOFACIAL WITHOUT CONTRAST TECHNIQUE: Multidetector CT imaging of the maxillofacial structures was performed. Multiplanar CT image reconstructions were also generated. RADIATION DOSE REDUCTION: This exam was performed according to the departmental dose-optimization program which includes automated exposure control, adjustment of the mA and/or kV according to patient size and/or use of iterative  reconstruction technique. COMPARISON:  Head CT today. FINDINGS: Osseous: Absent dentition. Mandible intact and normally located. Maxilla appears intact. No zygoma, pterygoid, or nasal bone fracture. Central skull base appears intact. Visible cervical vertebrae grossly intact, with intermittent bilateral facet ankylosis. Orbits: Chronic right lamina papyracea fracture. Other orbital walls intact. Postoperative changes to both globes, but otherwise the orbits soft tissues appear normal. Sinuses: Hyperplastic and clear throughout. Soft tissues: No discrete superficial soft tissue injury. Partially retropharyngeal course of both carotids. Calcified carotid atherosclerosis. Otherwise negative visible noncontrast deep soft tissue spaces of the face and neck. Limited intracranial: Reported separately. IMPRESSION: 1.  No acute traumatic injury identified in the Face. 2. Chronic right lamina papyracea fracture. Electronically Signed   By: Genevie Ann M.D.   On: 06/07/2022 11:04   CT HEAD WO CONTRAST (5MM)  Result Date: 06/07/2022 CLINICAL DATA:  76 year old female status post fall. Face contusions. EXAM: CT HEAD WITHOUT CONTRAST TECHNIQUE: Contiguous axial images were obtained from the base of the skull through the vertex without intravenous contrast. RADIATION DOSE REDUCTION: This exam was performed according to the departmental dose-optimization program which includes automated exposure control, adjustment of the mA and/or kV according to patient size and/or use of iterative reconstruction technique. COMPARISON:  Head CT 08/23/2021.  Face CT today reported separately. FINDINGS: Brain: Cerebral volume is stable since last year, within normal limits for age. No midline shift, ventriculomegaly, mass effect, evidence of mass lesion, intracranial hemorrhage or evidence of cortically based acute infarction. Mild for age patchy bilateral white matter hypodensity is stable. Vascular: Calcified atherosclerosis at the skull base. No  suspicious intracranial vascular hyperdensity. Skull: Chronic right lamina papyracea fracture. Facial bones detailed separately. No acute osseous abnormality identified. Sinuses/Orbits: Visualized paranasal sinuses and mastoids are stable and well aerated. Other: No discrete orbit or scalp soft tissue injury. IMPRESSION: 1. No acute traumatic injury identified. Facial bones detailed separately. 2. Stable noncontrast CT appearance of the brain, mild for age chronic white matter changes. Electronically Signed   By: Genevie Ann M.D.   On: 06/07/2022 11:01    Microbiology: Results for orders placed or performed during the hospital encounter of 08/23/21  Blood Culture (routine x 2)     Status: None   Collection Time: 08/23/21  8:41 PM   Specimen: BLOOD  Result Value Ref Range Status   Specimen Description BLOOD BLOOD LEFT HAND  Final   Special Requests   Final    BOTTLES DRAWN AEROBIC AND ANAEROBIC Blood Culture adequate volume   Culture   Final    NO GROWTH 5 DAYS Performed at [facility], [address] [place], [place] [address]    Report Status 08/28/2021 FINAL  Final  Resp Panel by RT-PCR (Flu A&B, Covid) Nasopharyngeal Swab     Status: Abnormal   Collection Time: 08/23/21  8:42 PM   Specimen: Nasopharyngeal  Swab; Nasopharyngeal(NP) swabs in vial transport medium  Result Value Ref Range Status   SARS Coronavirus 2 by RT PCR POSITIVE (A) NEGATIVE Final    Comment: RESULT CALLED TO, READ BACK BY AND VERIFIED WITH: JOANA CEBALLOS @2153  ON 08/23/21 SKL (NOTE) SARS-CoV-2 target nucleic acids are DETECTED.  The SARS-CoV-2 RNA is generally detectable in upper respiratory specimens during the acute phase of infection. Positive results are indicative of the presence of the identified virus, but do not rule out bacterial infection or co-infection with other pathogens not detected by the test. Clinical correlation with patient history and other diagnostic information is necessary to  determine patient infection status. The expected result is Negative.  Fact Sheet for Patients: EntrepreneurPulse.com.au  Fact Sheet for Healthcare Providers: IncredibleEmployment.be  This test is not yet approved or cleared by the [place] FDA and  has been authorized for detection and/or diagnosis of SARS-CoV-2 by FDA under an Emergency Use Authorization (EUA).  This EUA will remain in effect (meaning this test can  be used) for the duration of  the COVID-19 declaration under Section 564(b)(1) of the Act, 21 U.S.C. section 360bbb-3(b)(1), unless the authorization is terminated or revoked sooner.     Influenza A by PCR NEGATIVE NEGATIVE Final   Influenza B by PCR NEGATIVE NEGATIVE Final    Comment: (NOTE) The Xpert Xpress SARS-CoV-2/FLU/RSV plus assay is intended as an aid in the diagnosis of influenza from Nasopharyngeal swab specimens and should not be used as a sole basis for treatment. Nasal washings and aspirates are unacceptable for Xpert Xpress SARS-CoV-2/FLU/RSV testing.  Fact Sheet for Patients: EntrepreneurPulse.com.au  Fact Sheet for Healthcare Providers: IncredibleEmployment.be  This test is not yet approved or cleared by the [place] FDA and has been authorized for detection and/or diagnosis of SARS-CoV-2 by FDA under an Emergency Use Authorization (EUA). This EUA will remain in effect (meaning this test can be used) for the duration of the COVID-19 declaration under Section 564(b)(1) of the Act, 21 U.S.C. section 360bbb-3(b)(1), unless the authorization is terminated or revoked.  Performed at [facility], [address] [place], [place] [address]   Blood Culture (routine x 2)     Status: None   Collection Time: 08/23/21 11:41 PM   Specimen: BLOOD  Result Value Ref Range Status   Specimen Description BLOOD RIGHT ARM  Final   Special Requests   Final    BOTTLES  DRAWN AEROBIC AND ANAEROBIC Blood Culture results may not be optimal due to an inadequate volume of blood received in culture bottles   Culture   Final    NO GROWTH 5 DAYS Performed at [facility], [address] [place], [place] [address]    Report Status 08/29/2021 FINAL  Final    Labs: CBC: Recent Labs  Lab 06/07/22 1019  WBC 6.0  HGB 12.2  HCT 38.9  MCV 92.8  PLT [id number]*   Basic Metabolic Panel: Recent Labs  Lab 06/07/22 1834  NA 141  K 4.4  CL 108  CO2 27  GLUCOSE 183*  BUN 22  CREATININE 1.26*  CALCIUM 9.4   Liver Function Tests: Recent Labs  Lab 06/07/22 1834  AST 16  ALT 12  ALKPHOS 67  BILITOT 1.1  PROT 6.5  ALBUMIN 3.6   CBG: Recent Labs  Lab 06/08/22 0744 06/08/22 1113 06/08/22 1756 06/08/22 2032 06/09/22 0811  GLUCAP 118* 172* 208* 165* 181*    Discharge time spent: greater than 30 minutes.  Signed: Floyce Stakes  Arbutus Ped, DO [facility] 06/09/2022

## 2022-06-09 NOTE — TOC Progression Note (Signed)
Transition of Care [facility]) - Progression Note    Patient Details  Name: [name] Munoz MRN: [id number] Date of Birth: 1946/06/15  [facility] CM/SW Contact  Marlowe Sax, RN Phone Number: 06/09/2022, 9:07 AM  Clinical Narrative:     PASSR is still pending, they requested latest prgress notes, Uploaded will check to see if back after they have reviewed       Expected Discharge Plan and Services                                                 Social Determinants of Health (SDOH) Interventions    Readmission Risk Interventions     No data to display

## 2022-06-09 NOTE — Plan of Care (Signed)
Problem: Education: Goal: Ability to describe self-care measures that may prevent or decrease complications (Diabetes Survival Skills Education) will improve 06/09/2022 1141 by Berneta Sages, RN Outcome: Adequate for Discharge 06/09/2022 1141 by Berneta Sages, RN Outcome: Adequate for Discharge Goal: Individualized Educational Video(s) 06/09/2022 1141 by Berneta Sages, RN Outcome: Adequate for Discharge 06/09/2022 1141 by Berneta Sages, RN Outcome: Adequate for Discharge   Problem: Coping: Goal: Ability to adjust to condition or change in health will improve 06/09/2022 1141 by Berneta Sages, RN Outcome: Adequate for Discharge 06/09/2022 1141 by Berneta Sages, RN Outcome: Adequate for Discharge   Problem: Fluid Volume: Goal: Ability to maintain a balanced intake and output will improve 06/09/2022 1141 by Berneta Sages, RN Outcome: Adequate for Discharge 06/09/2022 1141 by Berneta Sages, RN Outcome: Adequate for Discharge   Problem: Health Behavior/Discharge Planning: Goal: Ability to identify and utilize available resources and services will improve 06/09/2022 1141 by Berneta Sages, RN Outcome: Adequate for Discharge 06/09/2022 1141 by Berneta Sages, RN Outcome: Adequate for Discharge Goal: Ability to manage health-related needs will improve 06/09/2022 1141 by Berneta Sages, RN Outcome: Adequate for Discharge 06/09/2022 1141 by Berneta Sages, RN Outcome: Adequate for Discharge   Problem: Metabolic: Goal: Ability to maintain appropriate glucose levels will improve 06/09/2022 1141 by Berneta Sages, RN Outcome: Adequate for Discharge 06/09/2022 1141 by Berneta Sages, RN Outcome: Adequate for Discharge   Problem: Nutritional: Goal: Maintenance of adequate nutrition will improve 06/09/2022 1141 by Berneta Sages, RN Outcome: Adequate for Discharge 06/09/2022 1141 by Berneta Sages, RN Outcome:  Adequate for Discharge Goal: Progress toward achieving an optimal weight will improve 06/09/2022 1141 by Berneta Sages, RN Outcome: Adequate for Discharge 06/09/2022 1141 by Berneta Sages, RN Outcome: Adequate for Discharge   Problem: Skin Integrity: Goal: Risk for impaired skin integrity will decrease 06/09/2022 1141 by Berneta Sages, RN Outcome: Adequate for Discharge 06/09/2022 1141 by Berneta Sages, RN Outcome: Adequate for Discharge   Problem: Tissue Perfusion: Goal: Adequacy of tissue perfusion will improve 06/09/2022 1141 by Berneta Sages, RN Outcome: Adequate for Discharge 06/09/2022 1141 by Berneta Sages, RN Outcome: Adequate for Discharge   Problem: Education: Goal: Knowledge of General Education information will improve Description: Including pain rating scale, medication(s)/side effects and non-pharmacologic comfort measures 06/09/2022 1141 by Berneta Sages, RN Outcome: Adequate for Discharge 06/09/2022 1141 by Berneta Sages, RN Outcome: Adequate for Discharge   Problem: Health Behavior/Discharge Planning: Goal: Ability to manage health-related needs will improve 06/09/2022 1141 by Berneta Sages, RN Outcome: Adequate for Discharge 06/09/2022 1141 by Berneta Sages, RN Outcome: Adequate for Discharge   Problem: Clinical Measurements: Goal: Ability to maintain clinical measurements within normal limits will improve 06/09/2022 1141 by Berneta Sages, RN Outcome: Adequate for Discharge 06/09/2022 1141 by Berneta Sages, RN Outcome: Adequate for Discharge Goal: Will remain free from infection 06/09/2022 1141 by Berneta Sages, RN Outcome: Adequate for Discharge 06/09/2022 1141 by Berneta Sages, RN Outcome: Adequate for Discharge Goal: Diagnostic test results will improve 06/09/2022 1141 by Berneta Sages, RN Outcome: Adequate for Discharge 06/09/2022 1141 by Berneta Sages, RN Outcome:  Adequate for Discharge Goal: Respiratory complications will improve 06/09/2022 1141 by Berneta Sages, RN Outcome: Adequate for Discharge 06/09/2022 1141 by Berneta Sages, RN Outcome: Adequate for Discharge Goal: Cardiovascular complication will be avoided 06/09/2022 1141 by Berneta Sages, RN Outcome: Adequate for Discharge 06/09/2022 1141 by Berneta Sages, RN Outcome: Adequate for Discharge   Problem: Activity: Goal: Risk for activity intolerance will decrease 06/09/2022 1141 by Berneta Sages, RN Outcome: Adequate for Discharge 06/09/2022  1141 by Berneta Sages, RN Outcome: Adequate for Discharge   Problem: Nutrition: Goal: Adequate nutrition will be maintained 06/09/2022 1141 by Berneta Sages, RN Outcome: Adequate for Discharge 06/09/2022 1141 by Berneta Sages, RN Outcome: Adequate for Discharge   Problem: Coping: Goal: Level of anxiety will decrease 06/09/2022 1141 by Berneta Sages, RN Outcome: Adequate for Discharge 06/09/2022 1141 by Berneta Sages, RN Outcome: Adequate for Discharge   Problem: Elimination: Goal: Will not experience complications related to bowel motility 06/09/2022 1141 by Berneta Sages, RN Outcome: Adequate for Discharge 06/09/2022 1141 by Berneta Sages, RN Outcome: Adequate for Discharge Goal: Will not experience complications related to urinary retention 06/09/2022 1141 by Berneta Sages, RN Outcome: Adequate for Discharge 06/09/2022 1141 by Berneta Sages, RN Outcome: Adequate for Discharge   Problem: Pain Managment: Goal: General experience of comfort will improve 06/09/2022 1141 by Berneta Sages, RN Outcome: Adequate for Discharge 06/09/2022 1141 by Berneta Sages, RN Outcome: Adequate for Discharge   Problem: Safety: Goal: Ability to remain free from injury will improve 06/09/2022 1141 by Berneta Sages, RN Outcome: Adequate for Discharge 06/09/2022 1141  by Berneta Sages, RN Outcome: Adequate for Discharge   Problem: Skin Integrity: Goal: Risk for impaired skin integrity will decrease 06/09/2022 1141 by Berneta Sages, RN Outcome: Adequate for Discharge 06/09/2022 1141 by Berneta Sages, RN Outcome: Adequate for Discharge   Problem: Education: Goal: Knowledge of General Education information will improve Description: Including pain rating scale, medication(s)/side effects and non-pharmacologic comfort measures 06/09/2022 1141 by Berneta Sages, RN Outcome: Adequate for Discharge 06/09/2022 1141 by Berneta Sages, RN Outcome: Adequate for Discharge   Problem: Health Behavior/Discharge Planning: Goal: Ability to manage health-related needs will improve 06/09/2022 1141 by Berneta Sages, RN Outcome: Adequate for Discharge 06/09/2022 1141 by Berneta Sages, RN Outcome: Adequate for Discharge   Problem: Clinical Measurements: Goal: Ability to maintain clinical measurements within normal limits will improve 06/09/2022 1141 by Berneta Sages, RN Outcome: Adequate for Discharge 06/09/2022 1141 by Berneta Sages, RN Outcome: Adequate for Discharge Goal: Will remain free from infection 06/09/2022 1141 by Berneta Sages, RN Outcome: Adequate for Discharge 06/09/2022 1141 by Berneta Sages, RN Outcome: Adequate for Discharge Goal: Diagnostic test results will improve 06/09/2022 1141 by Berneta Sages, RN Outcome: Adequate for Discharge 06/09/2022 1141 by Berneta Sages, RN Outcome: Adequate for Discharge Goal: Respiratory complications will improve 06/09/2022 1141 by Berneta Sages, RN Outcome: Adequate for Discharge 06/09/2022 1141 by Berneta Sages, RN Outcome: Adequate for Discharge Goal: Cardiovascular complication will be avoided 06/09/2022 1141 by Berneta Sages, RN Outcome: Adequate for Discharge 06/09/2022 1141 by Berneta Sages, RN Outcome: Adequate  for Discharge   Problem: Activity: Goal: Risk for activity intolerance will decrease 06/09/2022 1141 by Berneta Sages, RN Outcome: Adequate for Discharge 06/09/2022 1141 by Berneta Sages, RN Outcome: Adequate for Discharge   Problem: Nutrition: Goal: Adequate nutrition will be maintained 06/09/2022 1141 by Berneta Sages, RN Outcome: Adequate for Discharge 06/09/2022 1141 by Berneta Sages, RN Outcome: Adequate for Discharge   Problem: Coping: Goal: Level of anxiety will decrease 06/09/2022 1141 by Berneta Sages, RN Outcome: Adequate for Discharge 06/09/2022 1141 by Berneta Sages, RN Outcome: Adequate for Discharge   Problem: Elimination: Goal: Will not experience complications related to bowel motility 06/09/2022 1141 by Berneta Sages, RN Outcome: Adequate for Discharge 06/09/2022 1141 by Berneta Sages, RN Outcome: Adequate for Discharge Goal: Will not experience complications related to urinary retention 06/09/2022 1141 by Berneta Sages, RN Outcome: Adequate for Discharge 06/09/2022 1141 by Berneta Sages, RN Outcome: Adequate for Discharge  Problem: Pain Managment: Goal: General experience of comfort will improve 06/09/2022 1141 by Berneta Sages, RN Outcome: Adequate for Discharge 06/09/2022 1141 by Berneta Sages, RN Outcome: Adequate for Discharge   Problem: Safety: Goal: Ability to remain free from injury will improve 06/09/2022 1141 by Berneta Sages, RN Outcome: Adequate for Discharge 06/09/2022 1141 by Berneta Sages, RN Outcome: Adequate for Discharge   Problem: Skin Integrity: Goal: Risk for impaired skin integrity will decrease 06/09/2022 1141 by Berneta Sages, RN Outcome: Adequate for Discharge 06/09/2022 1141 by Berneta Sages, RN Outcome: Adequate for Discharge   Problem: Acute Rehab PT Goals(only PT should resolve) Goal: Pt Will Go Supine/Side To Sit Outcome: Adequate  for Discharge Goal: Pt Will Transfer Bed To Chair/Chair To Bed Outcome: Adequate for Discharge Goal: Pt/caregiver will Perform Home Exercise Program Outcome: Adequate for Discharge   Problem: Acute Rehab OT Goals (only OT should resolve) Goal: Pt. Will Perform Lower Body Dressing Outcome: Adequate for Discharge Goal: Pt. Will Transfer To Toilet Outcome: Adequate for Discharge Goal: Pt. Will Perform Toileting-Clothing Manipulation Outcome: Adequate for Discharge Goal: OT Additional ADL Goal #1 Outcome: Adequate for Discharge

## 2022-06-09 NOTE — Plan of Care (Signed)

## 2022-06-09 NOTE — TOC Progression Note (Signed)
Transition of Care [facility]) - Progression Note    Patient Details  Name: [name] Munoz MRN: [id number] Date of Birth: [birth year]  Transition of Care [facility]) CM/SW Contact  Marlowe Sax, RN Phone Number: 06/09/2022, 11:23 AM  Clinical Narrative:     Sherron Monday with michelle the social worker, I notified her that the patient chose the bed at [facility], I notified Debra at [facility] and [facility] will transport the patient,  She messaged the Transport team, they will pick up the patient at 1 PM, I provided the desk number and requested to ask for Bedside nurse       Expected Discharge Plan and Services                                                 Social Determinants of Health (SDOH) Interventions    Readmission Risk Interventions     No data to display

## 2022-06-09 NOTE — Progress Notes (Signed)
Physical Therapy Treatment Patient Details Name: [name] Munoz MRN: [id number] DOB: 1946/01/24 Today's Date: 06/09/2022   History of Present Illness Pt admitted for fall at home with acute non-surgical fx to R lateral condyle of R femur. Prior ORIF. HIstory includes HTN, HLD, DM, GERD, depression, and CAD. Pt is [facility] patient and attends [facility]d/wk.    PT Comments    Pt tolerated just under 2 hours up in the chair this am. Pt assisted back to bed with support of RW and ModA to stand from recliner, WBAT R LE with knee immobilizer on. Pt able to turn/pivot to bed with MinA for LE's to return to supine. Pt assisted to comfort, immobilizer removed while in bed. Overall good tolerance for activity despite 10/10 pain. Increased time required to complete tasks due to fear of falling and increased discomfort in R knee.  Pt tearful throughout session due to current situation. Pt to continue to work towards PT goals at [facility].   Recommendations for follow up therapy are one component of a multi-disciplinary discharge planning process, led by the attending physician.  Recommendations may be updated based on patient status, additional functional criteria and insurance authorization.  Follow Up Recommendations  Skilled nursing-short term rehab (<3 hours/day) Can patient physically be transported by private vehicle: No   Assistance Recommended at Discharge Frequent or constant Supervision/Assistance  Patient can return home with the following Two people to help with walking and/or transfers;Two people to help with bathing/dressing/bathroom;Help with stairs or ramp for entrance   Equipment Recommendations  Rolling walker (2 wheels)    Recommendations for Other Services       Precautions / Restrictions Precautions Precautions: Fall Required Braces or Orthoses: Knee Immobilizer - Right Knee Immobilizer - Right: On when out of bed or walking Restrictions Weight Bearing Restrictions: Yes RLE Weight  Bearing: Weight bearing as tolerated (with KI) Other Position/Activity Restrictions: per H&P- documented WBAT with KI per DR. Poggi     Mobility  Bed Mobility Overal bed mobility: Needs Assistance Bed Mobility: Sit to Supine     Supine to sit: Min assist (For LE's)          Transfers Overall transfer level: Needs assistance Equipment used: Rolling walker (2 wheels) Transfers: Sit to/from Stand Sit to Stand: Mod assist (from recliner)           General transfer comment: vc's for proper technique    Ambulation/Gait               General Gait Details: Only able to turn chair to bed with RW   Stairs             Wheelchair Mobility    Modified Rankin (Stroke Patients Only)       Balance Overall balance assessment: Needs assistance, History of Falls Sitting-balance support: Feet supported Sitting balance-Leahy Scale: Good     Standing balance support: Bilateral upper extremity supported, During functional activity Standing balance-Leahy Scale: Poor Standing balance comment: heavily reliant on RW                            Cognition Arousal/Alertness: Awake/alert Behavior During Therapy: WFL for tasks assessed/performed, Flat affect Overall Cognitive Status: Within Functional Limits for tasks assessed  Exercises General Exercises - Lower Extremity Ankle Circles/Pumps: AROM, Both, 15 reps    General Comments General comments (skin integrity, edema, etc.): Education provided on donning of KI when OOB or mobilizing, positioning to decrease pain, and encouragement regarding healing process      Pertinent Vitals/Pain Pain Assessment Pain Assessment: 0-10 Pain Score: 10-Worst pain ever Pain Location: R knee Pain Descriptors / Indicators: Aching, Grimacing, Discomfort, Crying Pain Intervention(s): Limited activity within patient's tolerance, Monitored during session,  Premedicated before session    Home Living                          Prior Function            PT Goals (current goals can now be found in the care plan section) Acute Rehab PT Goals Patient Stated Goal: to get stronger and stay mobile    Frequency    7X/week      PT Plan Current plan remains appropriate    Co-evaluation              AM-PAC PT "6 Clicks" Mobility   Outcome Measure  Help needed turning from your back to your side while in a flat bed without using bedrails?: A Little Help needed moving from lying on your back to sitting on the side of a flat bed without using bedrails?: A Lot Help needed moving to and from a bed to a chair (including a wheelchair)?: Total Help needed standing up from a chair using your arms (e.g., wheelchair or bedside chair)?: A Lot Help needed to walk in hospital room?: A Lot Help needed climbing 3-5 steps with a railing? : A Lot 6 Click Score: 12    End of Session Equipment Utilized During Treatment: Gait belt Activity Tolerance: Patient limited by pain Patient left: in bed;with call bell/phone within reach;with bed alarm set Nurse Communication: Mobility status PT Visit Diagnosis: Unsteadiness on feet (R26.81);Muscle weakness (generalized) (M62.81);History of falling (Z91.81);Difficulty in walking, not elsewhere classified (R26.2);Pain Pain - Right/Left: Right Pain - part of body: Knee     Time: 1140-1230 PT Time Calculation (min) (ACUTE ONLY): 50 min  Charges:  $Therapeutic Exercise: 8-22 mins $Therapeutic Activity: 23-37 mins                    Zadie Cleverly, PTA    Jannet Askew 06/09/2022, 12:55 PM

## 2022-06-09 NOTE — TOC Progression Note (Signed)
Transition of Care [facility]) - Progression Note    Patient Details  Name: [name] Munoz MRN: [id number] Date of Birth: Jul 15, 1946  Transition [facility]) CM/SW Contact  Marlowe Sax, RN Phone Number: 06/09/2022, 12:12 PM  Clinical Narrative:    The patient is to go to room 113B at [facility] [place] to pick up at 1 to transport        Expected Discharge Plan and Services           Expected Discharge Date: 06/09/22                                     Social Determinants of Health (SDOH) Interventions    Readmission Risk Interventions     No data to display

## 2022-06-09 NOTE — Progress Notes (Signed)
Rn called report to nurse April at receiving facility. Contact info provided should any additional questions or concerns arise.

## 2022-06-09 NOTE — TOC PASRR Note (Signed)
PASSR number back [id number]

## 2022-06-09 NOTE — Plan of Care (Signed)
Problem: Education: Goal: Ability to describe self-care measures that may prevent or decrease complications (Diabetes Survival Skills Education) will improve Outcome: Adequate for Discharge Goal: Individualized Educational Video(s) Outcome: Adequate for Discharge   Problem: Coping: Goal: Ability to adjust to condition or change in health will improve Outcome: Adequate for Discharge   Problem: Fluid Volume: Goal: Ability to maintain a balanced intake and output will improve Outcome: Adequate for Discharge   Problem: Health Behavior/Discharge Planning: Goal: Ability to identify and utilize available resources and services will improve Outcome: Adequate for Discharge Goal: Ability to manage health-related needs will improve Outcome: Adequate for Discharge   Problem: Metabolic: Goal: Ability to maintain appropriate glucose levels will improve Outcome: Adequate for Discharge   Problem: Nutritional: Goal: Maintenance of adequate nutrition will improve Outcome: Adequate for Discharge Goal: Progress toward achieving an optimal weight will improve Outcome: Adequate for Discharge   Problem: Skin Integrity: Goal: Risk for impaired skin integrity will decrease Outcome: Adequate for Discharge   Problem: Tissue Perfusion: Goal: Adequacy of tissue perfusion will improve Outcome: Adequate for Discharge   Problem: Education: Goal: Knowledge of General Education information will improve Description: Including pain rating scale, medication(s)/side effects and non-pharmacologic comfort measures Outcome: Adequate for Discharge   Problem: Health Behavior/Discharge Planning: Goal: Ability to manage health-related needs will improve Outcome: Adequate for Discharge   Problem: Clinical Measurements: Goal: Ability to maintain clinical measurements within normal limits will improve Outcome: Adequate for Discharge Goal: Will remain free from infection Outcome: Adequate for Discharge Goal:  Diagnostic test results will improve Outcome: Adequate for Discharge Goal: Respiratory complications will improve Outcome: Adequate for Discharge Goal: Cardiovascular complication will be avoided Outcome: Adequate for Discharge   Problem: Activity: Goal: Risk for activity intolerance will decrease Outcome: Adequate for Discharge   Problem: Nutrition: Goal: Adequate nutrition will be maintained Outcome: Adequate for Discharge   Problem: Coping: Goal: Level of anxiety will decrease Outcome: Adequate for Discharge   Problem: Elimination: Goal: Will not experience complications related to bowel motility Outcome: Adequate for Discharge Goal: Will not experience complications related to urinary retention Outcome: Adequate for Discharge   Problem: Pain Managment: Goal: General experience of comfort will improve Outcome: Adequate for Discharge   Problem: Safety: Goal: Ability to remain free from injury will improve Outcome: Adequate for Discharge   Problem: Skin Integrity: Goal: Risk for impaired skin integrity will decrease Outcome: Adequate for Discharge   Problem: Education: Goal: Knowledge of General Education information will improve Description: Including pain rating scale, medication(s)/side effects and non-pharmacologic comfort measures Outcome: Adequate for Discharge   Problem: Health Behavior/Discharge Planning: Goal: Ability to manage health-related needs will improve Outcome: Adequate for Discharge   Problem: Clinical Measurements: Goal: Ability to maintain clinical measurements within normal limits will improve Outcome: Adequate for Discharge Goal: Will remain free from infection Outcome: Adequate for Discharge Goal: Diagnostic test results will improve Outcome: Adequate for Discharge Goal: Respiratory complications will improve Outcome: Adequate for Discharge Goal: Cardiovascular complication will be avoided Outcome: Adequate for Discharge   Problem:  Activity: Goal: Risk for activity intolerance will decrease Outcome: Adequate for Discharge   Problem: Nutrition: Goal: Adequate nutrition will be maintained Outcome: Adequate for Discharge   Problem: Coping: Goal: Level of anxiety will decrease Outcome: Adequate for Discharge   Problem: Elimination: Goal: Will not experience complications related to bowel motility Outcome: Adequate for Discharge Goal: Will not experience complications related to urinary retention Outcome: Adequate for Discharge   Problem: Pain Managment: Goal: General experience of comfort   will improve Outcome: Adequate for Discharge   Problem: Safety: Goal: Ability to remain free from injury will improve Outcome: Adequate for Discharge   Problem: Skin Integrity: Goal: Risk for impaired skin integrity will decrease Outcome: Adequate for Discharge   

## 2022-07-02 ENCOUNTER — Other Ambulatory Visit: Payer: Self-pay

## 2022-07-02 ENCOUNTER — Emergency Department
Admission: EM | Admit: 2022-07-02 | Discharge: 2022-07-02 | Disposition: A | Discharge status: Skilled Nursing Facility | Payer: Medicare (Managed Care) | Attending: Emergency Medicine | Admitting: Emergency Medicine

## 2022-07-02 ENCOUNTER — Encounter: Payer: Self-pay | Admitting: Emergency Medicine

## 2022-07-02 DIAGNOSIS — R799 Abnormal finding of blood chemistry, unspecified: Secondary | ICD-10-CM | POA: Diagnosis present

## 2022-07-02 DIAGNOSIS — E119 Type 2 diabetes mellitus without complications: Secondary | ICD-10-CM | POA: Diagnosis not present

## 2022-07-02 DIAGNOSIS — I2510 Atherosclerotic heart disease of native coronary artery without angina pectoris: Secondary | ICD-10-CM | POA: Insufficient documentation

## 2022-07-02 DIAGNOSIS — I10 Essential (primary) hypertension: Secondary | ICD-10-CM | POA: Insufficient documentation

## 2022-07-02 DIAGNOSIS — E875 Hyperkalemia: Secondary | ICD-10-CM | POA: Insufficient documentation

## 2022-07-02 LAB — CBC WITH DIFFERENTIAL/PLATELET
Abs Immature Granulocytes: 0.01 10*3/uL (ref 0.00–0.07)
Basophils Absolute: 0 10*3/uL (ref 0.0–0.1)
Basophils Relative: 1 %
Eosinophils Absolute: 0.1 10*3/uL (ref 0.0–0.5)
Eosinophils Relative: 4 %
HCT: 38.5 % (ref 36.0–46.0)
Hemoglobin: 12 g/dL (ref 12.0–15.0)
Immature Granulocytes: 0 %
Lymphocytes Relative: 37 %
Lymphs Abs: 1.3 10*3/uL (ref 0.7–4.0)
MCH: 29.8 pg (ref 26.0–34.0)
MCHC: 31.2 g/dL (ref 30.0–36.0)
MCV: 95.5 fL (ref 80.0–100.0)
Monocytes Absolute: 0.3 10*3/uL (ref 0.1–1.0)
Monocytes Relative: 9 %
Neutro Abs: 1.8 10*3/uL (ref 1.7–7.7)
Neutrophils Relative %: 49 %
Platelets: 178 10*3/uL (ref 150–400)
RBC: 4.03 MIL/uL (ref 3.87–5.11)
RDW: 14.1 % (ref 11.5–15.5)
WBC: 3.5 10*3/uL — ABNORMAL LOW (ref 4.0–10.5)
nRBC: 0 % (ref 0.0–0.2)

## 2022-07-02 LAB — COMPREHENSIVE METABOLIC PANEL
ALT: 10 U/L (ref 0–44)
AST: 16 U/L (ref 15–41)
Albumin: 3.7 g/dL (ref 3.5–5.0)
Alkaline Phosphatase: 92 U/L (ref 38–126)
Anion gap: 10 (ref 5–15)
BUN: 27 mg/dL — ABNORMAL HIGH (ref 8–23)
CO2: 19 mmol/L — ABNORMAL LOW (ref 22–32)
Calcium: 9.1 mg/dL (ref 8.9–10.3)
Chloride: 106 mmol/L (ref 98–111)
Creatinine, Ser: 1.7 mg/dL — ABNORMAL HIGH (ref 0.44–1.00)
GFR, Estimated: 31 mL/min — ABNORMAL LOW (ref >60)
Glucose, Bld: 125 mg/dL — ABNORMAL HIGH (ref 70–99)
Potassium: 5.8 mmol/L — ABNORMAL HIGH (ref 3.5–5.1)
Sodium: 135 mmol/L (ref 135–145)
Total Bilirubin: 0.8 mg/dL (ref 0.3–1.2)
Total Protein: 7 g/dL (ref 6.5–8.1)

## 2022-07-02 MED ORDER — SODIUM ZIRCONIUM CYCLOSILICATE 10 G PO PACK
10.0000 g | PACK | Freq: Once | ORAL | Status: AC
  Administered 2022-07-02: 10 g via ORAL
  Filled 2022-07-02: qty 1

## 2022-07-02 NOTE — ED Triage Notes (Signed)
Pt in via EMS from [facility]. Pt with K+ of 6.2, labs completed Friday. HR 70, 132/73, CBG 130, 98.4 temp, 95% RA. #20g to left wrist

## 2022-07-02 NOTE — ED Triage Notes (Signed)
Pt here for hyperkalemia and AKI per [facility].  Pt had routine labs Friday and today labs resulted and they sent pt for K 6.2.  pt denies feeling bad or any pain.  EMS reports no elevated T waves on EKG.  Pt is currently on abx for UTI.

## 2022-07-02 NOTE — ED Provider Notes (Signed)
[facility] Provider Note    Event Date/Time   First MD Initiated Contact with Patient 07/02/22 1201     (approximate)   History   Abnormal Lab   HPI  [name] Munoz is a 76 y.o. female past medical history of coronary artery disease, diabetes, hypertension GERD who presents with elevated potassium reading on outpatient labs.  Patient was recently treated for UTI with Bactrim.  She is currently at a skilled nursing facility because she suffered a lateral condyle x-ray the right femur.  Potassium was 6.2 yesterday.  Patient feels fine denies nausea vomiting diarrhea or other GI losses.  She is currently being treated with meloxicam for her leg pain.  Denies chest pain shortness of breath    Past Medical History:  Diagnosis Date   Coronary artery disease    Depression    Diabetes mellitus without complication (HCC)    GERD (gastroesophageal reflux disease)    Hypertension     Patient Active Problem List   Diagnosis Date Noted   Obesity (BMI 30-39.9) 06/08/2022   Thrombocytopenia (HCC) 06/08/2022   Closed nondisplaced fracture of femoral condyle (HCC) 06/07/2022   Type II diabetes mellitus with renal manifestations (HCC)    Chronic kidney disease, stage 3b (HCC)    Fall at home, initial encounter    HLD (hyperlipidemia)    Depression    Closed nondisplaced fracture of lateral condyle of right femur (HCC)    CAD (coronary artery disease) 08/23/2021   Diabetes (HCC) 08/23/2021   GERD (gastroesophageal reflux disease) 08/23/2021   Essential hypertension 08/23/2021   COVID-19 virus infection 08/23/2021   Tibial fracture 05/15/2019   Hypotension 10/17/2018   UTI (urinary tract infection) 10/21/2015   Acute renal failure (ARF) (HCC) 10/19/2015   Intractable vomiting 09/18/2015     Physical Exam  Triage Vital Signs: ED Triage Vitals  Enc Vitals Group     BP 07/02/22 1138 (!) 142/61     Pulse Rate 07/02/22 1138 70     Resp 07/02/22 1138 18      Temp 07/02/22 1138 98.8 F (37.1 C)     Temp Source 07/02/22 1138 Oral     SpO2 07/02/22 1138 92 %     Weight 07/02/22 1140 218 lb (98.9 kg)     Height 07/02/22 1140 5\' 9"  (1.753 m)     Head Circumference --      Peak Flow --      Pain Score 07/02/22 1140 0     Pain Loc --      Pain Edu? --      Excl. in GC? --     Most recent vital signs: Vitals:   07/02/22 1138  BP: (!) 142/61  Pulse: 70  Resp: 18  Temp: 98.8 F (37.1 C)  SpO2: 92%     General: Awake, no distress.  CV:  Good peripheral perfusion.  Resp:  Normal effort.  Abd:  No distention.  Neuro:             Awake, Alert, Oriented x 3  Other:  Right lower extremity with knee immobilizer in place   ED Results / Procedures / Treatments  Labs (all labs ordered are listed, but only abnormal results are displayed) Labs Reviewed  COMPREHENSIVE METABOLIC PANEL - Abnormal; Notable for the following components:      Result Value   Potassium 5.8 (*)    CO2 19 (*)    Glucose, Bld 125 (*)  BUN 27 (*)    Creatinine, Ser 1.70 (*)    GFR, Estimated 31 (*)    All other components within normal limits  CBC WITH DIFFERENTIAL/PLATELET - Abnormal; Notable for the following components:   WBC 3.5 (*)    All other components within normal limits     EKG  EKG interpretation performed by myself: NSR, nml axis, nml intervals, no acute ischemic changes    RADIOLOGY    PROCEDURES:  Critical Care performed: No  .1-3 Lead EKG Interpretation  Performed by: Georga Hacking, MD Authorized by: Georga Hacking, MD     Interpretation: normal     ECG rate assessment: normal     Rhythm: sinus rhythm     Ectopy: none     Conduction: normal     The patient is on the cardiac monitor to evaluate for evidence of arrhythmia and/or significant heart rate changes.   MEDICATIONS ORDERED IN ED: Medications  sodium zirconium cyclosilicate (LOKELMA) packet 10 g (has no administration in time range)     IMPRESSION /  MDM / ASSESSMENT AND PLAN / ED COURSE  I reviewed the triage vital signs and the nursing notes.                              Patient's presentation is most consistent with acute presentation with potential threat to life or bodily function.  Differential diagnosis includes, but is not limited to, AKI, pseudohyperkalemia due to hemolysis, hyperkalemia secondary to medication side effect from Bactrim hypovolemia, AKI secondary to NSAID  Patient is 76 year old female presents with elevated potassium on outpatient labs.  It was 6.2 yesterday.  Today patient's creatinine is 1.7, as low as 1.26 several weeks ago.  Potassium is 5.8.  EKG has no changes.  Patient is asymptomatic.  I suspect that this elevated potassium is in the setting of recently completing Bactrim in addition to being on the NSAID meloxicam.  I spoke with the patient's nurse practitioner with [facility] who reviewed her labs and medications.  We both agreed that this is likely to improve now that she is off the Bactrim and we will hold the meloxicam and her lisinopril and recheck the lab in several days.  Her nurse practitioner plans to follow-up with her on Tuesday.  We will give a dose of Lokelma in the ED.  I encouraged the patient to orally rehydrate and also discussed avoidance of high potassium foods.  We will provide information to the facility on low potassium diet.     FINAL CLINICAL IMPRESSION(S) / ED DIAGNOSES   Final diagnoses:  Hyperkalemia     Rx / DC Orders   ED Discharge Orders     None        Note:  This document was prepared using Dragon voice recognition software and may include unintentional dictation errors.   Georga Hacking, MD 07/02/22 [phone]

## 2022-07-02 NOTE — Discharge Instructions (Signed)
Please hold the lisinopril and meloxicam.  Make sure you are staying adequately hydrated and drinking lots of water.  Please avoid foods that are rich in potassium such as tomatoes, bananas and potatoes.  Please see the instructions above for low potassium diet.  We will need to have your potassium rechecked in 2 to 3 days.

## 2023-01-19 ENCOUNTER — Other Ambulatory Visit: Payer: Self-pay | Admitting: Family Medicine

## 2023-01-19 DIAGNOSIS — Z1231 Encounter for screening mammogram for malignant neoplasm of breast: Secondary | ICD-10-CM

## 2023-03-30 ENCOUNTER — Ambulatory Visit
Admission: RE | Admit: 2023-03-30 | Discharge: 2023-03-30 | Disposition: A | Discharge status: Home / Self Care | Payer: Medicare (Managed Care) | Source: Ambulatory Visit | Attending: Family Medicine | Admitting: Family Medicine

## 2023-03-30 DIAGNOSIS — Z1231 Encounter for screening mammogram for malignant neoplasm of breast: Secondary | ICD-10-CM | POA: Insufficient documentation

## 2023-12-31 ENCOUNTER — Other Ambulatory Visit: Payer: Self-pay | Admitting: Family Medicine

## 2023-12-31 DIAGNOSIS — Z1231 Encounter for screening mammogram for malignant neoplasm of breast: Secondary | ICD-10-CM

## 2024-04-01 ENCOUNTER — Ambulatory Visit
Admission: RE | Admit: 2024-04-01 | Discharge: 2024-04-01 | Disposition: A | Discharge status: Home / Self Care | Payer: Medicare (Managed Care) | Source: Ambulatory Visit | Attending: Family Medicine | Admitting: Family Medicine

## 2024-04-01 DIAGNOSIS — Z1231 Encounter for screening mammogram for malignant neoplasm of breast: Secondary | ICD-10-CM | POA: Insufficient documentation
